# Patient Record
Sex: Female | Born: 1945 | Race: White | Hispanic: No | Smoking: Current every day smoker
Health system: Southern US, Community
[De-identification: ages and names within clinical notes are randomized; demographics above are authoritative.]

## PROBLEM LIST (undated history)

## (undated) DIAGNOSIS — S22069A Unspecified fracture of T7-T8 vertebra, initial encounter for closed fracture: Secondary | ICD-10-CM

## (undated) DIAGNOSIS — M549 Dorsalgia, unspecified: Secondary | ICD-10-CM

## (undated) DIAGNOSIS — N183 Chronic kidney disease, stage 3 unspecified: Secondary | ICD-10-CM

## (undated) DIAGNOSIS — G894 Chronic pain syndrome: Secondary | ICD-10-CM

## (undated) DIAGNOSIS — S22089A Unspecified fracture of T11-T12 vertebra, initial encounter for closed fracture: Secondary | ICD-10-CM

## (undated) DIAGNOSIS — G589 Mononeuropathy, unspecified: Secondary | ICD-10-CM

## (undated) DIAGNOSIS — J449 Chronic obstructive pulmonary disease, unspecified: Secondary | ICD-10-CM

## (undated) DIAGNOSIS — G4700 Insomnia, unspecified: Secondary | ICD-10-CM

## (undated) DIAGNOSIS — H269 Unspecified cataract: Secondary | ICD-10-CM

## (undated) DIAGNOSIS — I252 Old myocardial infarction: Secondary | ICD-10-CM

## (undated) DIAGNOSIS — F32A Depression, unspecified: Secondary | ICD-10-CM

## (undated) DIAGNOSIS — M1990 Unspecified osteoarthritis, unspecified site: Secondary | ICD-10-CM

## (undated) DIAGNOSIS — F419 Anxiety disorder, unspecified: Secondary | ICD-10-CM

## (undated) DIAGNOSIS — G8929 Other chronic pain: Secondary | ICD-10-CM

## (undated) DIAGNOSIS — E785 Hyperlipidemia, unspecified: Secondary | ICD-10-CM

## (undated) DIAGNOSIS — E119 Type 2 diabetes mellitus without complications: Secondary | ICD-10-CM

## (undated) DIAGNOSIS — F329 Major depressive disorder, single episode, unspecified: Secondary | ICD-10-CM

## (undated) DIAGNOSIS — I739 Peripheral vascular disease, unspecified: Secondary | ICD-10-CM

## (undated) DIAGNOSIS — E7800 Pure hypercholesterolemia, unspecified: Secondary | ICD-10-CM

## (undated) DIAGNOSIS — M810 Age-related osteoporosis without current pathological fracture: Secondary | ICD-10-CM

## (undated) DIAGNOSIS — I10 Essential (primary) hypertension: Secondary | ICD-10-CM

## (undated) DIAGNOSIS — M792 Neuralgia and neuritis, unspecified: Secondary | ICD-10-CM

## (undated) HISTORY — PX: TONSILLECTOMY: SUR1361

## (undated) HISTORY — DX: Pure hypercholesterolemia, unspecified: E78.00

## (undated) HISTORY — DX: Unspecified cataract: H26.9

## (undated) HISTORY — DX: Chronic obstructive pulmonary disease, unspecified: J44.9

## (undated) HISTORY — DX: Neuralgia and neuritis, unspecified: M79.2

## (undated) HISTORY — DX: Unspecified fracture of t11-T12 vertebra, initial encounter for closed fracture: S22.089A

## (undated) HISTORY — DX: Chronic kidney disease, stage 3 unspecified: N18.30

## (undated) HISTORY — PX: APPENDECTOMY: SHX54

## (undated) HISTORY — DX: Unspecified fracture of T7-t8 vertebra, initial encounter for closed fracture: S22.069A

## (undated) HISTORY — DX: Hyperlipidemia, unspecified: E78.5

## (undated) HISTORY — DX: Mononeuropathy, unspecified: G58.9

## (undated) HISTORY — DX: Depression, unspecified: F32.A

## (undated) HISTORY — DX: Unspecified osteoarthritis, unspecified site: M19.90

## (undated) HISTORY — DX: Old myocardial infarction: I25.2

## (undated) HISTORY — PX: BLADDER REPAIR: SHX76

## (undated) HISTORY — DX: Anxiety disorder, unspecified: F41.9

## (undated) HISTORY — DX: Major depressive disorder, single episode, unspecified: F32.9

## (undated) HISTORY — DX: Type 2 diabetes mellitus without complications: E11.9

## (undated) HISTORY — PX: WISDOM TOOTH EXTRACTION: SHX21

## (undated) HISTORY — DX: Age-related osteoporosis without current pathological fracture: M81.0

## (undated) HISTORY — DX: Essential (primary) hypertension: I10

## (undated) HISTORY — PX: OTHER SURGICAL HISTORY: SHX169

## (undated) HISTORY — DX: Peripheral vascular disease, unspecified: I73.9

## (undated) HISTORY — DX: Insomnia, unspecified: G47.00

## (undated) HISTORY — PX: ABDOMINAL HYSTERECTOMY: SHX81

## (undated) HISTORY — DX: Chronic pain syndrome: G89.4

## (undated) HISTORY — DX: Other chronic pain: G89.29

---

## 2001-01-03 ENCOUNTER — Emergency Department (HOSPITAL_COMMUNITY): Admission: EM | Admit: 2001-01-03 | Discharge: 2001-01-03 | Payer: Self-pay | Admitting: Emergency Medicine

## 2001-02-11 ENCOUNTER — Encounter: Payer: Self-pay | Admitting: Neurology

## 2001-02-11 ENCOUNTER — Encounter: Admission: RE | Admit: 2001-02-11 | Discharge: 2001-02-11 | Payer: Self-pay | Admitting: Neurology

## 2006-03-27 ENCOUNTER — Encounter: Admission: RE | Admit: 2006-03-27 | Discharge: 2006-03-27 | Payer: Self-pay | Admitting: Family Medicine

## 2006-09-09 ENCOUNTER — Encounter: Admission: RE | Admit: 2006-09-09 | Discharge: 2006-09-09 | Payer: Self-pay | Admitting: Orthopedic Surgery

## 2009-11-16 ENCOUNTER — Encounter: Admission: RE | Admit: 2009-11-16 | Discharge: 2009-11-16 | Payer: Self-pay | Admitting: Family Medicine

## 2009-12-06 ENCOUNTER — Encounter (HOSPITAL_COMMUNITY): Admission: RE | Admit: 2009-12-06 | Discharge: 2010-02-07 | Payer: Self-pay | Admitting: Family Medicine

## 2012-05-09 ENCOUNTER — Emergency Department (HOSPITAL_COMMUNITY)
Admission: EM | Admit: 2012-05-09 | Discharge: 2012-05-10 | Disposition: A | Discharge status: Home / Self Care | Payer: Medicare Other | Attending: Emergency Medicine | Admitting: Emergency Medicine

## 2012-05-09 ENCOUNTER — Encounter (HOSPITAL_COMMUNITY): Payer: Self-pay | Admitting: Emergency Medicine

## 2012-05-09 DIAGNOSIS — S31109A Unspecified open wound of abdominal wall, unspecified quadrant without penetration into peritoneal cavity, initial encounter: Secondary | ICD-10-CM | POA: Insufficient documentation

## 2012-05-09 DIAGNOSIS — W5551XA Bitten by raccoon, initial encounter: Secondary | ICD-10-CM

## 2012-05-09 DIAGNOSIS — S61209A Unspecified open wound of unspecified finger without damage to nail, initial encounter: Secondary | ICD-10-CM | POA: Insufficient documentation

## 2012-05-09 DIAGNOSIS — F17200 Nicotine dependence, unspecified, uncomplicated: Secondary | ICD-10-CM | POA: Insufficient documentation

## 2012-05-09 DIAGNOSIS — IMO0001 Reserved for inherently not codable concepts without codable children: Secondary | ICD-10-CM | POA: Insufficient documentation

## 2012-05-09 DIAGNOSIS — Y92009 Unspecified place in unspecified non-institutional (private) residence as the place of occurrence of the external cause: Secondary | ICD-10-CM | POA: Insufficient documentation

## 2012-05-09 DIAGNOSIS — Z23 Encounter for immunization: Secondary | ICD-10-CM | POA: Insufficient documentation

## 2012-05-09 MED ORDER — AMOXICILLIN-POT CLAVULANATE 875-125 MG PO TABS
1.0000 | ORAL_TABLET | Freq: Once | ORAL | Status: AC
  Administered 2012-05-10: 1 via ORAL
  Filled 2012-05-09: qty 1

## 2012-05-09 MED ORDER — TETANUS-DIPHTHERIA TOXOIDS TD 5-2 LFU IM INJ
0.5000 mL | INJECTION | Freq: Once | INTRAMUSCULAR | Status: AC
  Administered 2012-05-10: 0.5 mL via INTRAMUSCULAR
  Filled 2012-05-09: qty 0.5

## 2012-05-09 MED ORDER — HYDROCODONE-ACETAMINOPHEN 5-325 MG PO TABS
1.0000 | ORAL_TABLET | Freq: Once | ORAL | Status: AC
  Administered 2012-05-10: 1 via ORAL
  Filled 2012-05-09: qty 1

## 2012-05-09 NOTE — ED Notes (Signed)
ZOX:[id number]<AV> Expected date:<BR> Expected time:<BR> Means of arrival:<BR> Comments:<BR> Triage 3

## 2012-05-09 NOTE — ED Notes (Signed)
MD at bedside. 

## 2012-05-09 NOTE — ED Notes (Signed)
Pt alert, nad, c/o "attacked by raccoon", pt has several laceration to right fa, right finger skin tear, bleeding controlled, resp even unlabored, skin pwd

## 2012-05-10 MED ORDER — AMOXICILLIN-POT CLAVULANATE 875-125 MG PO TABS: 1.0000 | ORAL_TABLET | Freq: Two times a day (BID) | ORAL | Status: AC

## 2012-05-10 MED ORDER — HYDROCODONE-ACETAMINOPHEN 5-325 MG PO TABS: 1.0000 | ORAL_TABLET | ORAL | Status: AC | PRN

## 2012-05-10 NOTE — ED Provider Notes (Signed)
Medical screening examination/treatment/procedure(s) were conducted as a shared visit with non-physician practitioner(s) and myself.  I personally evaluated the patient during the encounter  Abx. Local wound care. Racoon in custody. Pt prefers to not have rabies vaccine and immunoglobulin at this time until status known. She was outside with dogs, therefore not necessarily an unprovoked attack. Infection warnings   Lyanne Co, MD 05/10/12 [phone]

## 2012-05-10 NOTE — ED Notes (Signed)
MD at bedside.  EDPA Thelma Barge suturing

## 2012-05-10 NOTE — ED Provider Notes (Signed)
History     CSN: [id number]  Arrival date & time 05/09/12  2258   First MD Initiated Contact with Patient 05/09/12 2339      Chief Complaint  Patient presents with  . Animal Bite    (Consider location/radiation/quality/duration/timing/severity/associated sxs/prior treatment) HPI Comments: Patient here status post been attacked by a raccoon. States that she took her dog out just before bed and there was a raccoon in the yard. She states that the raccoon then quit after her dog who ran to her and knocked her down. She states that the raccoon then attacked her. She has scratches to her right forearm and hand with a bite to the dorsum of the proximal phalanx of the fourth finger. Also with a bite mark to the right flank and bite mark to the lateral heels. All wounds are hemostatic. The finger injury has a chunk of skin which has been removed. The patient is able to flex and extend and has good sensation distally. The raccoon was subsequently killed by her son-in-law and is in the custody of animal control. She works 8/10 pain to the areas. Denies any injury from the fall. Reports tetanus is not up-to-date. States her penicillin allergy consists of abdominal pain but denies rash, anaphylaxis, diarrhea.  Patient is a 66 y.o. female presenting with animal bite. The history is provided by the patient. No language interpreter was used.  Animal Bite  The incident occurred just prior to arrival. The incident occurred at home. She came to the ER via personal transport. Torso Injury Location: Right abdomen. There is an injury to the right forearm and right hand. There is an injury to the right ring finger. There is an injury to the right foot and left foot. The pain is moderate. It is unlikely that a foreign body is present. Associated symptoms include pain when bearing weight. Pertinent negatives include no chest pain, no numbness, no visual disturbance, no abdominal pain, no bowel incontinence, no nausea, no  vomiting, no bladder incontinence, no headaches, no hearing loss, no inability to bear weight, no neck pain, no focal weakness, no decreased responsiveness, no light-headedness, no loss of consciousness, no seizures, no tingling, no weakness, no cough, no difficulty breathing and no memory loss. There have been no prior injuries to these areas. She is right-handed. Her tetanus status is unknown. There were no sick contacts. She has received no recent medical care.    History reviewed. No pertinent past medical history.  Past Surgical History  Procedure Date  . Abdominal hysterectomy     No family history on file.  History  Substance Use Topics  . Smoking status: Current Everyday Smoker -- 1.0 packs/day    Types: Cigarettes  . Smokeless tobacco: Not on file  . Alcohol Use: No    OB History    Grav Para Term Preterm Abortions TAB SAB Ect Mult Living                  Review of Systems  Constitutional: Negative for fever, chills and decreased responsiveness.  HENT: Negative for hearing loss and neck pain.   Eyes: Negative for pain and visual disturbance.  Respiratory: Negative for cough.   Cardiovascular: Negative for chest pain.  Gastrointestinal: Negative for nausea, vomiting, abdominal pain and bowel incontinence.  Genitourinary: Negative for bladder incontinence and dysuria.  Musculoskeletal: Positive for arthralgias.  Skin: Positive for wound.  Neurological: Negative for tingling, focal weakness, seizures, loss of consciousness, weakness, light-headedness, numbness and headaches.  Psychiatric/Behavioral: Negative for memory loss.  All other systems reviewed and are negative.    Allergies  Morphine and related; Penicillins; Sulfa drugs cross reactors; and Xanax  Home Medications   Current Outpatient Rx  Name Route Sig Dispense Refill  . TRAMADOL HCL 50 MG PO TABS Oral Take 50 mg by mouth 3 (three) times daily.      BP 166/97  Pulse 83  Temp 98 F (36.7 C)  Resp  16  Wt 158 lb (71.668 kg)  SpO2 96%  Physical Exam  Nursing note and vitals reviewed. Constitutional: She is oriented to person, place, and time. She appears well-developed and well-nourished. No distress.  HENT:  Head: Normocephalic and atraumatic.  Right Ear: External ear normal.  Left Ear: External ear normal.  Nose: Nose normal.  Mouth/Throat: Oropharynx is clear and moist. No oropharyngeal exudate.  Eyes: Conjunctivae are normal. Pupils are equal, round, and reactive to light. No scleral icterus.  Neck: Normal range of motion. Neck supple.  Cardiovascular: Normal rate, regular rhythm and normal heart sounds.  Exam reveals no gallop and no friction rub.   No murmur heard. Pulmonary/Chest: Effort normal and breath sounds normal. No respiratory distress. She has no wheezes. She has no rales. She exhibits no tenderness.  Abdominal: Soft. Bowel sounds are normal. She exhibits no distension. There is no tenderness.  Musculoskeletal: Normal range of motion. She exhibits tenderness. She exhibits no edema.  Lymphadenopathy:    She has no cervical adenopathy.  Neurological: She is alert and oriented to person, place, and time. No cranial nerve deficit. She exhibits normal muscle tone. Coordination normal.  Skin: Skin is warm and dry. There is erythema.       Superficial hemostatic scratches to right forearm and hand. Dorsum of right fourth finger at the proximal phalanx with 1 cm laceration, portion of the lacerated area has an avulsed area of where the skin is missing, flexion and extension are intact, sensation intact distally, wound is hemostatic, capillary refill less than 3 seconds, 1 cm bite mark to right flank which is hemostatic, 1 cm bite mark to bilateral heels also hemostatic  Psychiatric: She has a normal mood and affect. Her behavior is normal. Judgment and thought content normal.    ED Course  Procedures (including critical care time)  Labs Reviewed - No data to display No  results found. LACERATION REPAIR Performed by: Patrecia Pour. Authorized by: Patrecia Pour Consent: Verbal consent obtained. Risks and benefits: risks, benefits and alternatives were discussed Consent given by: patient Patient identity confirmed: provided demographic data Prepped and Draped in normal sterile fashion Wound explored  Laceration Location: right 4th finger  Laceration Length: 1cm  No Foreign Bodies seen or palpated  Anesthesia: local infiltration  Local anesthetic: lidocaine 2% without epinephrine  Anesthetic total: 3 ml  Irrigation method: syringe Amount of cleaning: standard  Skin closure: 5.0 nylon  Number of sutures: 2  Technique: loose simple interrupted.  Patient tolerance: Patient tolerated the procedure well with no immediate complications.   Animal bite   MDM  Patient here status post multiple bites and scratches from a raccoon. The bite to the right fourth finger because of the area of avulsion was loosely tacked down with 2 sutures. The rest of the wounds were dressed with bacitracin ointment. Patient was given a dose of pain medication, tetanus updated and Augmentin. The patient has an allergy to penicillin, this allergy was noted to be pain so we have opted to give her the  Augmentin and watch her for an hour. If no reaction or adverse effects after an hour she will be discharged. Steffanie Dunn is currently in the custody of animal control who will be testing for rabies. Patient is aware that she should return to [facility] come urgent care for rabies vaccine and immunoglobulin should the raccoon be positive.          Izola [place], [place] 05/10/12 0120

## 2012-05-11 ENCOUNTER — Emergency Department (HOSPITAL_COMMUNITY): Payer: Medicare Other

## 2012-05-11 ENCOUNTER — Encounter (HOSPITAL_COMMUNITY): Payer: Self-pay | Admitting: Emergency Medicine

## 2012-05-11 ENCOUNTER — Emergency Department (HOSPITAL_COMMUNITY)
Admission: EM | Admit: 2012-05-11 | Discharge: 2012-05-11 | Disposition: A | Discharge status: Home / Self Care | Payer: Medicare Other | Attending: Emergency Medicine | Admitting: Emergency Medicine

## 2012-05-11 DIAGNOSIS — S61409A Unspecified open wound of unspecified hand, initial encounter: Secondary | ICD-10-CM | POA: Insufficient documentation

## 2012-05-11 DIAGNOSIS — IMO0001 Reserved for inherently not codable concepts without codable children: Secondary | ICD-10-CM | POA: Insufficient documentation

## 2012-05-11 DIAGNOSIS — Z23 Encounter for immunization: Secondary | ICD-10-CM | POA: Insufficient documentation

## 2012-05-11 DIAGNOSIS — IMO0002 Reserved for concepts with insufficient information to code with codable children: Secondary | ICD-10-CM | POA: Insufficient documentation

## 2012-05-11 DIAGNOSIS — S91309A Unspecified open wound, unspecified foot, initial encounter: Secondary | ICD-10-CM | POA: Insufficient documentation

## 2012-05-11 DIAGNOSIS — R071 Chest pain on breathing: Secondary | ICD-10-CM | POA: Insufficient documentation

## 2012-05-11 DIAGNOSIS — T148XXA Other injury of unspecified body region, initial encounter: Secondary | ICD-10-CM

## 2012-05-11 DIAGNOSIS — S51809A Unspecified open wound of unspecified forearm, initial encounter: Secondary | ICD-10-CM | POA: Insufficient documentation

## 2012-05-11 DIAGNOSIS — Z203 Contact with and (suspected) exposure to rabies: Secondary | ICD-10-CM | POA: Insufficient documentation

## 2012-05-11 MED ORDER — RABIES IMMUNE GLOBULIN 150 UNIT/ML IM INJ
20.0000 [IU]/kg | INJECTION | Freq: Once | INTRAMUSCULAR | Status: AC
  Administered 2012-05-11: 1425 [IU] via INTRAMUSCULAR
  Filled 2012-05-11: qty 9.5

## 2012-05-11 MED ORDER — RABIES VACCINE, PCEC IM SUSR
1.0000 mL | Freq: Once | INTRAMUSCULAR | Status: AC
  Administered 2012-05-11: 1 mL via INTRAMUSCULAR
  Filled 2012-05-11: qty 1

## 2012-05-11 NOTE — ED Notes (Signed)
Given schedule for serial vaccines as per flow manager.

## 2012-05-11 NOTE — ED Notes (Signed)
Copy of rabies vaccine schedule faxed to [facility] by Lonzo Cloud, RN.  Copy of rabies vaccine schedule faxed to urgent care and pharmacy.

## 2012-05-11 NOTE — ED Notes (Signed)
Pt returned to ED to initiate rabies series. Stated that [facility] confirmed a positive report

## 2012-05-11 NOTE — ED Provider Notes (Signed)
History     CSN: [id number]  Arrival date & time 05/11/12  1144   First MD Initiated Contact with Patient 05/11/12 1209      Chief Complaint  Patient presents with  . Animal Bite    Exposed to racoon 48 hrs ago, scratches to r/hip, both feet, rhand    (Consider location/radiation/quality/duration/timing/severity/associated sxs/prior treatment) Patient is a 66 y.o. female presenting with animal bite. The history is provided by the patient.  Animal Bite  The incident occurred more than 2 days ago. Pertinent negatives include no headaches and no weakness.  Pt reports being attacked by a racoon in her yard two days ago. States was bit on the right foot, right hip, right hand. States also fell to the ground, pain to left ribs. State right heel severe pain, also in the left ribs. Pt on Augmentin for infection, Had stitches placed in right hand. States racoon was tested, and test came back positive for rabies. Was told to come for rabies shots. Pt denies fever, chills, malaise.   History reviewed. No pertinent past medical history.  Past Surgical History  Procedure Date  . Abdominal hysterectomy   . Bladder repair     Family History  Problem Relation Age of Onset  . Diabetes Mother   . Cancer Mother     History  Substance Use Topics  . Smoking status: Current Everyday Smoker -- 1.0 packs/day    Types: Cigarettes  . Smokeless tobacco: Not on file  . Alcohol Use: No    OB History    Grav Para Term Preterm Abortions TAB SAB Ect Mult Living                  Review of Systems  Constitutional: Negative for fever and chills.  Respiratory: Negative.   Cardiovascular: Negative.   Gastrointestinal: Negative.   Musculoskeletal: Negative for back pain.  Skin: Positive for color change and wound.  Neurological: Negative for dizziness, weakness and headaches.    Allergies  Morphine and related; Penicillins; Sulfa drugs cross reactors; and Xanax  Home Medications   Current  Outpatient Rx  Name Route Sig Dispense Refill  . AMOXICILLIN-POT CLAVULANATE 875-125 MG PO TABS Oral Take 1 tablet by mouth 2 (two) times daily. 28 tablet 0  . HYDROCODONE-ACETAMINOPHEN 5-325 MG PO TABS Oral Take 1 tablet by mouth every 4 (four) hours as needed for pain. 30 tablet 0  . TRAMADOL HCL 50 MG PO TABS Oral Take 50 mg by mouth 3 (three) times daily.      BP 140/77  Pulse 81  Temp 98.7 F (37.1 C) (Oral)  Resp 16  Wt 158 lb 8.2 oz (71.9 kg)  SpO2 97%  Physical Exam  Nursing note and vitals reviewed. Constitutional: She is oriented to person, place, and time. She appears well-developed and well-nourished. No distress.  Neck: Neck supple.  Cardiovascular: Normal rate, regular rhythm and normal heart sounds.   Pulmonary/Chest: Effort normal and breath sounds normal. No respiratory distress. She has no wheezes. She has no rales. She exhibits tenderness.       Tender over left anterior lower ribs, no crepitus  Abdominal: Soft. Bowel sounds are normal. She exhibits no distension. There is no tenderness.  Musculoskeletal:       Multiple abrasions and puncture wounds over right hand, right foot, right forearm. Full ROM of all joints.   Neurological: She is alert and oriented to person, place, and time.  Skin: Skin is warm and dry.  Multiple abrasions and puncture wounds to the right hand, right hip, right heel and foot, right forearm. There are two stitches intact in the right dorsal proximal ring finger. Some redness, and drainage noted around the wound.   Psychiatric: She has a normal mood and affect.    ED Course  Procedures (including critical care time)  Labs Reviewed - No data to display Dg Ribs Unilateral W/chest Left  05/11/2012  *RADIOLOGY REPORT*  Clinical Data: Left-sided chest pain.  LEFT RIBS AND CHEST - 3+ VIEW  Comparison: Visualized lung bases on CT abdomen and pelvis 11/16/2009.  Findings: No left rib fractures identified.  No intrinsic osseous abnormalities  involving the left ribs.  Cardiac silhouette normal in size.  Thoracic aorta mildly tortuous and atherosclerotic.  Hilar and mediastinal contours otherwise unremarkable.  Focal opacity in the lingula, shown on prior CT examinations represent scarring.  Lungs otherwise clear.  No visible pleural effusions.  No pneumothorax.  IMPRESSION:  1.  No left rib fractures identified. 2.  Scarring in the lingula.  No acute cardiopulmonary disease.  Original Report Authenticated By: Arnell Sieving, M.D.   Dg Foot Complete Right  05/11/2012  *RADIOLOGY REPORT*  Clinical Data: Status post animal bite on the healed the foot.  RIGHT FOOT COMPLETE - 3+ VIEW  Comparison: None.  Findings: Imaged bones, joints and soft tissues appear normal.  IMPRESSION: Negative exam.  Original Report Authenticated By: Bernadene Bell. D'ALESSIO, M.D.   X-rays obtained. Pt received rabies vaccine and immunoglobulin. She is in no distress. Wounds apper inflamed but no signs of major infection in any of them. She is on augmentin currently. Will d/c home with follow up for suture removal and next rabies vaccine.   1. Animal bites   2. Rabies exposure       MDM         Lottie Mussel, PA 05/11/12 1527

## 2012-05-12 ENCOUNTER — Telehealth (HOSPITAL_COMMUNITY): Payer: Self-pay | Admitting: *Deleted

## 2012-05-12 NOTE — ED Provider Notes (Signed)
Medical screening examination/treatment/procedure(s) were performed by non-physician practitioner and as supervising physician I was immediately available for consultation/collaboration.  Raeford Razor, MD 05/12/12 [phone]

## 2012-05-12 NOTE — ED Notes (Signed)
Pt.'s daughter called to schedule times for rabies vaccine. 7/9 @ 1400, 7/16 and 7/23 @ 1130.  She is concerned about her Mom not getting the rabies immune globulin around the bites after talking to the vet.  I told her I would call the pharmacist and call her back.  Discussed with the pharmacist at [facility]. I explained that she was bit by a raccoon and came in 2 days after the bites, when she found out it was rabid. I reviewed her chart and told him where she was bit. He said because her bites were on her finger and heel it was not feasable and as long as she got it within the 7 days of the bites, in her other muscles ( not the gluteus or the arm the rabies vaccine went in),and she finishes the series on time, she should be OK. I called [name]- daughter back and told her this.  This reassured her. [name] 05/12/2012

## 2012-05-14 ENCOUNTER — Encounter (HOSPITAL_COMMUNITY): Payer: Self-pay | Admitting: *Deleted

## 2012-05-14 ENCOUNTER — Emergency Department (HOSPITAL_COMMUNITY)
Admission: EM | Admit: 2012-05-14 | Discharge: 2012-05-14 | Disposition: A | Discharge status: Home / Self Care | Payer: Medicare Other | Source: Home / Self Care | Attending: Emergency Medicine | Admitting: Emergency Medicine

## 2012-05-14 DIAGNOSIS — Z23 Encounter for immunization: Secondary | ICD-10-CM

## 2012-05-14 MED ORDER — RABIES VACCINE, PCEC IM SUSR
INTRAMUSCULAR | Status: AC
  Filled 2012-05-14: qty 1

## 2012-05-14 MED ORDER — RABIES VACCINE, PCEC IM SUSR
1.0000 mL | Freq: Once | INTRAMUSCULAR | Status: AC
  Administered 2012-05-14: 1 mL via INTRAMUSCULAR

## 2012-05-14 NOTE — ED Notes (Signed)
Here for 2nd rabies vaccine for rabid racoon attack.  C/o pain to R ring finger and L shoulder blade.  C/ feeling fatigued.

## 2012-05-14 NOTE — ED Notes (Signed)
Call if any problems.  Return 7/16 @ 1130.

## 2012-05-18 ENCOUNTER — Encounter (HOSPITAL_COMMUNITY): Payer: Self-pay | Admitting: *Deleted

## 2012-05-18 ENCOUNTER — Emergency Department (INDEPENDENT_AMBULATORY_CARE_PROVIDER_SITE_OTHER)
Admission: EM | Admit: 2012-05-18 | Discharge: 2012-05-18 | Disposition: A | Discharge status: Home / Self Care | Payer: Medicare Other | Source: Home / Self Care

## 2012-05-18 DIAGNOSIS — Z23 Encounter for immunization: Secondary | ICD-10-CM

## 2012-05-18 MED ORDER — RABIES VACCINE, PCEC IM SUSR
1.0000 mL | Freq: Once | INTRAMUSCULAR | Status: AC
  Administered 2012-05-18: 1 mL via INTRAMUSCULAR

## 2012-05-18 MED ORDER — RABIES VACCINE, PCEC IM SUSR
INTRAMUSCULAR | Status: AC
  Filled 2012-05-18: qty 1

## 2012-05-18 NOTE — ED Notes (Signed)
Here for third rabies vaccine for bites from a rabid raccoon.  Wounds are healing, no signs of infection. States she went to see Dr. Holley Bouche today and had the sutures removed from her R ring finger.

## 2012-05-25 ENCOUNTER — Emergency Department (HOSPITAL_COMMUNITY)
Admission: EM | Admit: 2012-05-25 | Discharge: 2012-05-25 | Disposition: A | Discharge status: Home / Self Care | Payer: Medicare Other | Source: Home / Self Care

## 2012-05-25 ENCOUNTER — Encounter (HOSPITAL_COMMUNITY): Payer: Self-pay | Admitting: *Deleted

## 2012-05-25 DIAGNOSIS — Z23 Encounter for immunization: Secondary | ICD-10-CM

## 2012-05-25 MED ORDER — RABIES VACCINE, PCEC IM SUSR
INTRAMUSCULAR | Status: AC
  Filled 2012-05-25: qty 1

## 2012-05-25 MED ORDER — RABIES VACCINE, PCEC IM SUSR
1.0000 mL | Freq: Once | INTRAMUSCULAR | Status: AC
  Administered 2012-05-25: 1 mL via INTRAMUSCULAR

## 2012-05-25 NOTE — ED Notes (Signed)
Here for last rabies vaccine.  Wounds appear to be healing. No signs of infection.

## 2012-12-14 ENCOUNTER — Ambulatory Visit
Admission: RE | Admit: 2012-12-14 | Discharge: 2012-12-14 | Disposition: A | Discharge status: Home / Self Care | Payer: Medicare Other | Source: Ambulatory Visit | Attending: Family Medicine | Admitting: Family Medicine

## 2012-12-14 ENCOUNTER — Other Ambulatory Visit: Payer: Self-pay | Admitting: Family Medicine

## 2012-12-14 DIAGNOSIS — M545 Low back pain, unspecified: Secondary | ICD-10-CM

## 2012-12-31 ENCOUNTER — Other Ambulatory Visit: Payer: Self-pay

## 2012-12-31 ENCOUNTER — Other Ambulatory Visit: Payer: Self-pay | Admitting: Family Medicine

## 2012-12-31 DIAGNOSIS — Z1231 Encounter for screening mammogram for malignant neoplasm of breast: Secondary | ICD-10-CM

## 2013-01-21 ENCOUNTER — Encounter: Payer: Self-pay | Admitting: Diagnostic Neuroimaging

## 2013-01-24 ENCOUNTER — Ambulatory Visit (INDEPENDENT_AMBULATORY_CARE_PROVIDER_SITE_OTHER): Payer: Medicare Other | Admitting: Diagnostic Neuroimaging

## 2013-01-24 ENCOUNTER — Encounter: Payer: Self-pay | Admitting: Diagnostic Neuroimaging

## 2013-01-24 VITALS — BP 152/87 | HR 72 | Temp 98.1°F | Ht 59.0 in | Wt 161.0 lb

## 2013-01-24 DIAGNOSIS — R209 Unspecified disturbances of skin sensation: Secondary | ICD-10-CM

## 2013-01-24 DIAGNOSIS — M79609 Pain in unspecified limb: Secondary | ICD-10-CM

## 2013-01-24 DIAGNOSIS — IMO0002 Reserved for concepts with insufficient information to code with codable children: Secondary | ICD-10-CM

## 2013-01-24 DIAGNOSIS — M5414 Radiculopathy, thoracic region: Secondary | ICD-10-CM

## 2013-01-24 DIAGNOSIS — R200 Anesthesia of skin: Secondary | ICD-10-CM

## 2013-01-24 NOTE — Patient Instructions (Signed)
Consider trial of cymbalta.
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
## 2013-01-28 ENCOUNTER — Ambulatory Visit
Admission: RE | Admit: 2013-01-28 | Discharge: 2013-01-28 | Disposition: A | Discharge status: Home / Self Care | Payer: Medicare Other | Source: Ambulatory Visit

## 2013-01-28 DIAGNOSIS — Z1231 Encounter for screening mammogram for malignant neoplasm of breast: Secondary | ICD-10-CM

## 2013-02-22 ENCOUNTER — Other Ambulatory Visit: Payer: Self-pay | Admitting: Neurosurgery

## 2013-03-03 HISTORY — PX: BACK SURGERY: SHX140

## 2013-03-10 ENCOUNTER — Encounter (HOSPITAL_COMMUNITY): Payer: Self-pay | Admitting: Pharmacist

## 2013-03-14 ENCOUNTER — Encounter (HOSPITAL_COMMUNITY): Payer: Self-pay

## 2013-03-14 ENCOUNTER — Encounter (HOSPITAL_COMMUNITY)
Admission: RE | Admit: 2013-03-14 | Discharge: 2013-03-14 | Disposition: A | Discharge status: Home / Self Care | Payer: Medicare Other | Source: Ambulatory Visit | Attending: Neurosurgery | Admitting: Neurosurgery

## 2013-03-14 DIAGNOSIS — Z01818 Encounter for other preprocedural examination: Secondary | ICD-10-CM | POA: Insufficient documentation

## 2013-03-14 DIAGNOSIS — R9431 Abnormal electrocardiogram [ECG] [EKG]: Secondary | ICD-10-CM | POA: Insufficient documentation

## 2013-03-14 DIAGNOSIS — Z01810 Encounter for preprocedural cardiovascular examination: Secondary | ICD-10-CM | POA: Insufficient documentation

## 2013-03-14 DIAGNOSIS — Z01812 Encounter for preprocedural laboratory examination: Secondary | ICD-10-CM | POA: Insufficient documentation

## 2013-03-14 LAB — CBC
Hemoglobin: 14.2 g/dL (ref 12.0–15.0)
MCH: 28 pg (ref 26.0–34.0)
MCV: 83.4 fL (ref 78.0–100.0)
Platelets: 319 10*3/uL (ref 150–400)
RBC: 5.07 MIL/uL (ref 3.87–5.11)
WBC: 9.3 10*3/uL (ref 4.0–10.5)

## 2013-03-14 LAB — ABO/RH: ABO/RH(D): O POS

## 2013-03-14 LAB — BASIC METABOLIC PANEL
CO2: 26 mEq/L (ref 19–32)
Glucose, Bld: 156 mg/dL — ABNORMAL HIGH (ref 70–99)
Potassium: 3.7 mEq/L (ref 3.5–5.1)
Sodium: 138 mEq/L (ref 135–145)

## 2013-03-14 LAB — TYPE AND SCREEN: ABO/RH(D): O POS

## 2013-03-14 LAB — SURGICAL PCR SCREEN: Staphylococcus aureus: NEGATIVE

## 2013-03-14 NOTE — Pre-Procedure Instructions (Addendum)
Jasman JARRETT CHICOINE  03/14/2013   Your procedure is scheduled on:  03/22/13  Report to [facility] at 530 AM.  Call this number if you have problems the morning of surgery: [phone]   Remember:   Do not eat food or drink liquids after midnight.   Take these medicines the morning of surgery with A SIP OF WATER: pain med    Do not wear jewelry, make-up or nail polish.  Do not wear lotions, powders, or perfumes. You may wear deodorant.  Do not shave 48 hours prior to surgery. Men may shave face and neck.  Do not bring valuables to the hospital.  Contacts, dentures or bridgework may not be worn into surgery.  Leave suitcase in the car. After surgery it may be brought to your room.  For patients admitted to the hospital, checkout time is 11:00 AM the day of  discharge.   Patients discharged the day of surgery will not be allowed to drive  home.  Name and phone number of your driver:   Special Instructions: Shower using CHG 2 nights before surgery and the night before surgery.  If you shower the day of surgery use CHG.  Use special wash - you have one bottle of CHG for all showers.  You should use approximately 1/3 of the bottle for each shower.   Please read over the following fact sheets that you were given: Pain Booklet, Coughing and Deep Breathing, Blood Transfusion Information, MRSA Information and Surgical Site Infection Prevention

## 2013-03-14 NOTE — Progress Notes (Addendum)
Office called to release orders  req'd cxr from randall harris at [facility] req'd prior ekg, office notes from dr [employer]

## 2013-03-16 NOTE — Progress Notes (Signed)
This is a 67 year old female who is scheduled for lumbar spine surgery to be performed by Dr. Hilda Lias on Tuesday 22 Mar 2013. An EKG was performed on 14 Mar 2013 and has been confirmed by cardiology. 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 The EKG is negative for Q waves in V1/V2. I personally spoke with this patient's PCP office (Dr. Johny Blamer with [facility]) and there is no prior EKG on file for comparison. This patient's risk factors include hypertension, hyperlipidemia and tobacco abuse, however, there is no known history of coronary artery disease. She did not voice any cardiac related symptoms, issues or complaints during her pre-anesthesia evaluation. May proceed with surgery as scheduled.

## 2013-03-21 NOTE — H&P (Signed)
Alexis Mclean is an 67 y.o. female.   Chief Complaint: lumbar pain HPI: patient seen in my office several days ago ago with a history of lumbar pain with radiation to both legs and shoulder pain to the left. Also dizziness with headache. The lumbar pain is worse with sitting and walking. She knows about having spondylolisthesis at the lumbar level  Past Medical History  Diagnosis Date  . Hyperlipemia   . Neuropathic pain of chest   . Hypertension     Past Surgical History  Procedure Laterality Date  . Abdominal hysterectomy    . Bladder repair    . Wisdom tooth extraction    . Tonsillectomy    . Appendectomy    . Salpingoorrphorectomy Right     Family History  Problem Relation Age of Onset  . Stroke Mother   . Diabetes Mother   . Diabetes Father   . Emphysema Father   . Cancer Father     Lung  . Asthma Father   . Diabetes Sister   . Diabetes Brother    Social History:  reports that she has been smoking Cigarettes.  She has a 39.9 pack-year smoking history. She has never used smokeless tobacco. She reports that she does not drink alcohol or use illicit drugs.  Allergies:  Allergies  Allergen Reactions  . Latex Rash  . Lyrica (Pregabalin)     unknown  . Morphine And Related     BP dropped  . Neurontin (Gabapentin)     Room spun  . Penicillins Diarrhea and Other (See Comments)    Cramping in abd.took recently no problems  . Sulfa Drugs Cross Reactors Nausea Only    Really sick feeling  . Xanax (Alprazolam)     insomnia    No prescriptions prior to admission    No results found for this or any previous visit (from the past 48 hour(s)). No results found.  Review of Systems  Eyes: Negative.   Respiratory: Positive for shortness of breath.   Cardiovascular: Negative.        Arterial hypertension  Gastrointestinal: Positive for heartburn.  Genitourinary: Positive for urgency.  Musculoskeletal: Positive for back pain.  Skin: Negative.   Neurological:  Positive for dizziness, focal weakness, weakness and headaches.  Endo/Heme/Allergies: Negative.     There were no vitals taken for this visit. Physical Exam hent,nl. Neck, nl. Cv, nl. Lungs, rales bilaterally. Abdomen,soft. Extremities, nl. NEURO  WEAKNESS OF df BOTH FEET. SENSORY, NL. . dtr 1 plus. Mri and lumbar r-rays shows grade 2 spondylolisthesis at l5-s1.  Assessment/Plan Patient to go ahead with decompression and fusion at l5-s1 with pedicles screws and poss cages.she is aware of the risks such as infection, csf leak, hematoma and because of the chronicity , the possibility of no improvement   Hikeem Andersson M 03/21/2013, 6:00 PM

## 2013-03-22 ENCOUNTER — Inpatient Hospital Stay (HOSPITAL_COMMUNITY): Payer: Medicare Other

## 2013-03-22 ENCOUNTER — Inpatient Hospital Stay (HOSPITAL_COMMUNITY)
Admission: RE | Admit: 2013-03-22 | Discharge: 2013-03-27 | DRG: 460 | Disposition: A | Discharge status: Home / Self Care | Payer: Medicare Other | Source: Ambulatory Visit | Attending: Neurosurgery | Admitting: Neurosurgery

## 2013-03-22 ENCOUNTER — Encounter (HOSPITAL_COMMUNITY): Payer: Self-pay | Admitting: *Deleted

## 2013-03-22 ENCOUNTER — Inpatient Hospital Stay (HOSPITAL_COMMUNITY): Payer: Medicare Other | Admitting: Anesthesiology

## 2013-03-22 ENCOUNTER — Encounter (HOSPITAL_COMMUNITY)
Admission: RE | Disposition: A | Discharge status: Home / Self Care | Payer: Self-pay | Source: Ambulatory Visit | Attending: Neurosurgery

## 2013-03-22 ENCOUNTER — Encounter (HOSPITAL_COMMUNITY): Payer: Self-pay | Admitting: Anesthesiology

## 2013-03-22 DIAGNOSIS — F17200 Nicotine dependence, unspecified, uncomplicated: Secondary | ICD-10-CM | POA: Diagnosis present

## 2013-03-22 DIAGNOSIS — IMO0002 Reserved for concepts with insufficient information to code with codable children: Secondary | ICD-10-CM | POA: Diagnosis present

## 2013-03-22 DIAGNOSIS — M545 Low back pain, unspecified: Secondary | ICD-10-CM

## 2013-03-22 DIAGNOSIS — I10 Essential (primary) hypertension: Secondary | ICD-10-CM | POA: Diagnosis present

## 2013-03-22 DIAGNOSIS — E785 Hyperlipidemia, unspecified: Secondary | ICD-10-CM | POA: Diagnosis present

## 2013-03-22 DIAGNOSIS — Q762 Congenital spondylolisthesis: Principal | ICD-10-CM

## 2013-03-22 DIAGNOSIS — K5900 Constipation, unspecified: Secondary | ICD-10-CM | POA: Diagnosis not present

## 2013-03-22 DIAGNOSIS — M810 Age-related osteoporosis without current pathological fracture: Secondary | ICD-10-CM | POA: Diagnosis present

## 2013-03-22 SURGERY — POSTERIOR LUMBAR FUSION 1 LEVEL
Anesthesia: General | Site: Back | Wound class: Clean

## 2013-03-22 MED ORDER — DIAZEPAM 5 MG PO TABS
5.0000 mg | ORAL_TABLET | Freq: Four times a day (QID) | ORAL | Status: DC | PRN
  Administered 2013-03-22 – 2013-03-27 (×16): 5 mg via ORAL
  Filled 2013-03-22 (×16): qty 1

## 2013-03-22 MED ORDER — NEOSTIGMINE METHYLSULFATE 1 MG/ML IJ SOLN
INTRAMUSCULAR | Status: DC | PRN
  Administered 2013-03-22: 4 mg via INTRAVENOUS

## 2013-03-22 MED ORDER — ONDANSETRON HCL 4 MG/2ML IJ SOLN: 4.0000 mg | Freq: Four times a day (QID) | INTRAMUSCULAR | Status: DC | PRN

## 2013-03-22 MED ORDER — FENTANYL CITRATE 0.05 MG/ML IJ SOLN
INTRAMUSCULAR | Status: DC | PRN
  Administered 2013-03-22: 50 ug via INTRAVENOUS
  Administered 2013-03-22: 25 ug via INTRAVENOUS
  Administered 2013-03-22 (×2): 50 ug via INTRAVENOUS
  Administered 2013-03-22: 25 ug via INTRAVENOUS
  Administered 2013-03-22 (×6): 50 ug via INTRAVENOUS

## 2013-03-22 MED ORDER — PROMETHAZINE HCL 25 MG/ML IJ SOLN: 6.2500 mg | INTRAMUSCULAR | Status: DC | PRN

## 2013-03-22 MED ORDER — SODIUM CHLORIDE 0.9 % IV SOLN: 250.0000 mL | INTRAVENOUS | Status: DC

## 2013-03-22 MED ORDER — ACETAMINOPHEN 650 MG RE SUPP: 650.0000 mg | RECTAL | Status: DC | PRN

## 2013-03-22 MED ORDER — SODIUM CHLORIDE 0.9 % IJ SOLN: 9.0000 mL | INTRAMUSCULAR | Status: DC | PRN

## 2013-03-22 MED ORDER — ZOLPIDEM TARTRATE 5 MG PO TABS: 5.0000 mg | ORAL_TABLET | Freq: Every evening | ORAL | Status: DC | PRN

## 2013-03-22 MED ORDER — THROMBIN 20000 UNITS EX SOLR
CUTANEOUS | Status: DC | PRN
  Administered 2013-03-22: 08:00:00 via TOPICAL

## 2013-03-22 MED ORDER — VANCOMYCIN HCL IN DEXTROSE 1-5 GM/200ML-% IV SOLN
1000.0000 mg | Freq: Two times a day (BID) | INTRAVENOUS | Status: DC
  Administered 2013-03-22 – 2013-03-24 (×5): 1000 mg via INTRAVENOUS
  Filled 2013-03-22 (×7): qty 200

## 2013-03-22 MED ORDER — HYDROMORPHONE 0.3 MG/ML IV SOLN
INTRAVENOUS | Status: DC
  Administered 2013-03-22: 1.5 mg via INTRAVENOUS

## 2013-03-22 MED ORDER — PHENOL 1.4 % MT LIQD: 1.0000 | OROMUCOSAL | Status: DC | PRN

## 2013-03-22 MED ORDER — SODIUM CHLORIDE 0.9 % IV SOLN
INTRAVENOUS | Status: DC
  Administered 2013-03-22 – 2013-03-23 (×2): via INTRAVENOUS

## 2013-03-22 MED ORDER — MENTHOL 3 MG MT LOZG: 1.0000 | LOZENGE | OROMUCOSAL | Status: DC | PRN

## 2013-03-22 MED ORDER — ONDANSETRON HCL 4 MG/2ML IJ SOLN: 4.0000 mg | INTRAMUSCULAR | Status: DC | PRN

## 2013-03-22 MED ORDER — SODIUM CHLORIDE 0.9 % IJ SOLN: 3.0000 mL | INTRAMUSCULAR | Status: DC | PRN

## 2013-03-22 MED ORDER — PHENYLEPHRINE HCL 10 MG/ML IJ SOLN
INTRAMUSCULAR | Status: DC | PRN
  Administered 2013-03-22: 80 ug via INTRAVENOUS
  Administered 2013-03-22: 40 ug via INTRAVENOUS
  Administered 2013-03-22: 80 ug via INTRAVENOUS

## 2013-03-22 MED ORDER — VECURONIUM BROMIDE 10 MG IV SOLR
INTRAVENOUS | Status: DC | PRN
  Administered 2013-03-22: 3 mg via INTRAVENOUS

## 2013-03-22 MED ORDER — HEMOSTATIC AGENTS (NO CHARGE) OPTIME
TOPICAL | Status: DC | PRN
  Administered 2013-03-22: 1 via TOPICAL

## 2013-03-22 MED ORDER — BUPIVACAINE LIPOSOME 1.3 % IJ SUSP
20.0000 mL | INTRAMUSCULAR | Status: AC
  Filled 2013-03-22: qty 20

## 2013-03-22 MED ORDER — EPHEDRINE SULFATE 50 MG/ML IJ SOLN
INTRAMUSCULAR | Status: DC | PRN
  Administered 2013-03-22 (×2): 5 mg via INTRAVENOUS

## 2013-03-22 MED ORDER — NALOXONE HCL 0.4 MG/ML IJ SOLN: 0.4000 mg | INTRAMUSCULAR | Status: DC | PRN

## 2013-03-22 MED ORDER — MEPERIDINE HCL 25 MG/ML IJ SOLN: 6.2500 mg | INTRAMUSCULAR | Status: DC | PRN

## 2013-03-22 MED ORDER — MIDAZOLAM HCL 5 MG/5ML IJ SOLN
INTRAMUSCULAR | Status: DC | PRN
  Administered 2013-03-22: 2 mg via INTRAVENOUS

## 2013-03-22 MED ORDER — LACTATED RINGERS IV SOLN
INTRAVENOUS | Status: DC | PRN
  Administered 2013-03-22 (×3): via INTRAVENOUS

## 2013-03-22 MED ORDER — HYDROMORPHONE HCL PF 1 MG/ML IJ SOLN
0.2500 mg | INTRAMUSCULAR | Status: DC | PRN
  Administered 2013-03-22: 0.5 mg via INTRAVENOUS
  Administered 2013-03-22: 0.25 mg via INTRAVENOUS

## 2013-03-22 MED ORDER — ROCURONIUM BROMIDE 100 MG/10ML IV SOLN
INTRAVENOUS | Status: DC | PRN
  Administered 2013-03-22: 50 mg via INTRAVENOUS

## 2013-03-22 MED ORDER — GLYCOPYRROLATE 0.2 MG/ML IJ SOLN
INTRAMUSCULAR | Status: DC | PRN
  Administered 2013-03-22: 0.6 mg via INTRAVENOUS

## 2013-03-22 MED ORDER — SODIUM CHLORIDE 0.9 % IJ SOLN: 3.0000 mL | Freq: Two times a day (BID) | INTRAMUSCULAR | Status: DC

## 2013-03-22 MED ORDER — ONDANSETRON HCL 4 MG/2ML IJ SOLN
INTRAMUSCULAR | Status: DC | PRN
  Administered 2013-03-22: 4 mg via INTRAVENOUS

## 2013-03-22 MED ORDER — LOSARTAN POTASSIUM 25 MG PO TABS
25.0000 mg | ORAL_TABLET | Freq: Every morning | ORAL | Status: DC
  Administered 2013-03-25 – 2013-03-27 (×3): 25 mg via ORAL
  Filled 2013-03-22 (×6): qty 1

## 2013-03-22 MED ORDER — ACETAMINOPHEN 325 MG PO TABS: 650.0000 mg | ORAL_TABLET | ORAL | Status: DC | PRN

## 2013-03-22 MED ORDER — HYDROMORPHONE HCL PF 1 MG/ML IJ SOLN
INTRAMUSCULAR | Status: AC
  Administered 2013-03-22: 0.25 mg via INTRAVENOUS
  Filled 2013-03-22: qty 1

## 2013-03-22 MED ORDER — DIPHENHYDRAMINE HCL 50 MG/ML IJ SOLN: 12.5000 mg | Freq: Four times a day (QID) | INTRAMUSCULAR | Status: DC | PRN

## 2013-03-22 MED ORDER — DIPHENHYDRAMINE HCL 12.5 MG/5ML PO ELIX: 12.5000 mg | ORAL_SOLUTION | Freq: Four times a day (QID) | ORAL | Status: DC | PRN

## 2013-03-22 MED ORDER — HYDROMORPHONE 0.3 MG/ML IV SOLN
INTRAVENOUS | Status: AC
  Administered 2013-03-22: 11:00:00
  Filled 2013-03-22: qty 25

## 2013-03-22 MED ORDER — LIDOCAINE HCL (CARDIAC) 20 MG/ML IV SOLN
INTRAVENOUS | Status: DC | PRN
  Administered 2013-03-22: 100 mg via INTRAVENOUS

## 2013-03-22 MED ORDER — 0.9 % SODIUM CHLORIDE (POUR BTL) OPTIME
TOPICAL | Status: DC | PRN
  Administered 2013-03-22: 1000 mL

## 2013-03-22 MED ORDER — ARTIFICIAL TEARS OP OINT
TOPICAL_OINTMENT | OPHTHALMIC | Status: DC | PRN
  Administered 2013-03-22: 1 via OPHTHALMIC

## 2013-03-22 MED ORDER — PROPOFOL 10 MG/ML IV BOLUS
INTRAVENOUS | Status: DC | PRN
  Administered 2013-03-22: 30 mg via INTRAVENOUS
  Administered 2013-03-22: 150 mg via INTRAVENOUS

## 2013-03-22 MED ORDER — VANCOMYCIN HCL IN DEXTROSE 1-5 GM/200ML-% IV SOLN
1000.0000 mg | INTRAVENOUS | Status: AC
  Administered 2013-03-22: 1000 mg via INTRAVENOUS

## 2013-03-22 MED ORDER — BUPIVACAINE LIPOSOME 1.3 % IJ SUSP
INTRAMUSCULAR | Status: DC | PRN
  Administered 2013-03-22: 20 mL

## 2013-03-22 MED ORDER — OXYCODONE-ACETAMINOPHEN 5-325 MG PO TABS
1.0000 | ORAL_TABLET | ORAL | Status: DC | PRN
  Administered 2013-03-22 – 2013-03-23 (×4): 2 via ORAL
  Filled 2013-03-22 (×4): qty 2

## 2013-03-22 SURGICAL SUPPLY — 69 items
ADH SKN CLS APL DERMABOND .7 (GAUZE/BANDAGES/DRESSINGS) ×1
APL SKNCLS STERI-STRIP NONHPOA (GAUZE/BANDAGES/DRESSINGS) ×1
BENZOIN TINCTURE PRP APPL 2/3 (GAUZE/BANDAGES/DRESSINGS) ×2 IMPLANT
BLADE SURG ROTATE 9660 (MISCELLANEOUS) IMPLANT
BUR ACORN 6.0 (BURR) ×2 IMPLANT
BUR MATCHSTICK NEURO 3.0 LAGG (BURR) ×2 IMPLANT
CANISTER SUCTION 2500CC (MISCELLANEOUS) ×2 IMPLANT
CAP REVERE LOCKING (Cap) ×4 IMPLANT
CLOTH BEACON ORANGE TIMEOUT ST (SAFETY) ×2 IMPLANT
CONN CROSSLINK REV 38-50MM (Connector) ×2 IMPLANT
CONNECTOR CRSLINK REV 38-50MM (Connector) IMPLANT
CONT SPEC 4OZ CLIKSEAL STRL BL (MISCELLANEOUS) ×3 IMPLANT
COVER BACK TABLE 24X17X13 BIG (DRAPES) IMPLANT
COVER TABLE BACK 60X90 (DRAPES) ×2 IMPLANT
DERMABOND ADVANCED (GAUZE/BANDAGES/DRESSINGS) ×1
DERMABOND ADVANCED .7 DNX12 (GAUZE/BANDAGES/DRESSINGS) IMPLANT
DRAPE C-ARM 42X72 X-RAY (DRAPES) ×4 IMPLANT
DRAPE LAPAROTOMY 100X72X124 (DRAPES) ×2 IMPLANT
DRAPE POUCH INSTRU U-SHP 10X18 (DRAPES) ×2 IMPLANT
DRSG PAD ABDOMINAL 8X10 ST (GAUZE/BANDAGES/DRESSINGS) ×1 IMPLANT
DURAPREP 26ML APPLICATOR (WOUND CARE) ×2 IMPLANT
ELECT REM PT RETURN 9FT ADLT (ELECTROSURGICAL) ×2
ELECTRODE REM PT RTRN 9FT ADLT (ELECTROSURGICAL) ×1 IMPLANT
EVACUATOR 1/8 PVC DRAIN (DRAIN) IMPLANT
EVACUATOR 3/16  PVC DRAIN (DRAIN) ×1
EVACUATOR 3/16 PVC DRAIN (DRAIN) IMPLANT
GAUZE SPONGE 4X4 16PLY XRAY LF (GAUZE/BANDAGES/DRESSINGS) ×2 IMPLANT
GLOVE BIOGEL M 8.0 STRL (GLOVE) ×1 IMPLANT
GLOVE EXAM NITRILE LRG STRL (GLOVE) ×2 IMPLANT
GLOVE EXAM NITRILE MD LF STRL (GLOVE) IMPLANT
GLOVE EXAM NITRILE XL STR (GLOVE) IMPLANT
GLOVE EXAM NITRILE XS STR PU (GLOVE) IMPLANT
GOWN BRE IMP SLV AUR LG STRL (GOWN DISPOSABLE) ×2 IMPLANT
GOWN BRE IMP SLV AUR XL STRL (GOWN DISPOSABLE) ×3 IMPLANT
GOWN STRL REIN 2XL LVL4 (GOWN DISPOSABLE) IMPLANT
KIT BASIN OR (CUSTOM PROCEDURE TRAY) ×2 IMPLANT
KIT INFUSE MEDIUM (Orthopedic Implant) ×1 IMPLANT
KIT ROOM TURNOVER OR (KITS) ×2 IMPLANT
NDL HYPO 18GX1.5 BLUNT FILL (NEEDLE) IMPLANT
NDL HYPO 21X1.5 SAFETY (NEEDLE) IMPLANT
NDL HYPO 25X1 1.5 SAFETY (NEEDLE) IMPLANT
NEEDLE HYPO 18GX1.5 BLUNT FILL (NEEDLE) IMPLANT
NEEDLE HYPO 21X1.5 SAFETY (NEEDLE) ×2 IMPLANT
NEEDLE HYPO 25X1 1.5 SAFETY (NEEDLE) IMPLANT
NS IRRIG 1000ML POUR BTL (IV SOLUTION) ×2 IMPLANT
PACK LAMINECTOMY NEURO (CUSTOM PROCEDURE TRAY) ×2 IMPLANT
PAD ARMBOARD 7.5X6 YLW CONV (MISCELLANEOUS) ×6 IMPLANT
PATTIES SURGICAL .5 X1 (DISPOSABLE) ×2 IMPLANT
PATTIES SURGICAL .5 X3 (DISPOSABLE) IMPLANT
PATTIES SURGICAL 1X1 (DISPOSABLE) ×1 IMPLANT
ROD REVERE 6.35 40MM (Rod) ×2 IMPLANT
SCREW REVERE 5.5X45 (Screw) ×2 IMPLANT
SCREW REVERE 6.35 5.5X40MM (Screw) ×2 IMPLANT
SPONGE GAUZE 4X4 12PLY (GAUZE/BANDAGES/DRESSINGS) ×2 IMPLANT
SPONGE LAP 4X18 X RAY DECT (DISPOSABLE) IMPLANT
SPONGE NEURO XRAY DETECT 1X3 (DISPOSABLE) IMPLANT
SPONGE SURGIFOAM ABS GEL 100 (HEMOSTASIS) ×2 IMPLANT
STRIP CLOSURE SKIN 1/2X4 (GAUZE/BANDAGES/DRESSINGS) ×2 IMPLANT
SUT VIC AB 1 CT1 18XBRD ANBCTR (SUTURE) ×2 IMPLANT
SUT VIC AB 1 CT1 8-18 (SUTURE) ×6
SUT VIC AB 2-0 CP2 18 (SUTURE) ×3 IMPLANT
SUT VIC AB 3-0 SH 8-18 (SUTURE) ×2 IMPLANT
SYR 20CC LL (SYRINGE) ×1 IMPLANT
SYR 20ML ECCENTRIC (SYRINGE) ×2 IMPLANT
SYR 5ML LL (SYRINGE) IMPLANT
TOWEL OR 17X24 6PK STRL BLUE (TOWEL DISPOSABLE) ×2 IMPLANT
TOWEL OR 17X26 10 PK STRL BLUE (TOWEL DISPOSABLE) ×2 IMPLANT
TRAY FOLEY BAG SILVER LF 14FR (CATHETERS) ×1 IMPLANT
WATER STERILE IRR 1000ML POUR (IV SOLUTION) ×2 IMPLANT

## 2013-03-22 NOTE — Progress Notes (Signed)
Pt. Stating she has a skin tear on left hand. Stated she bumped it on a table last Wednesday. The area has a scab on it and it also has erythemia around the edges of the scab. Applied sterile 2x2 gauze dressing and secured with tape.

## 2013-03-22 NOTE — Anesthesia Postprocedure Evaluation (Signed)
  Anesthesia Post-op Note  Patient: Alexis Mclean  Procedure(s) Performed: Procedure(s) with comments: POSTERIOR LUMBAR FUSION 1 LEVEL (N/A) - Lumbar five sacral one Diskectomy/Cages/Pedicle screws/posterolateral arthrodesis/cellsaver  Patient Location: PACU  Anesthesia Type:General  Level of Consciousness: awake  Airway and Oxygen Therapy: Patient Spontanous Breathing  Post-op Pain: mild  Post-op Assessment: Post-op Vital signs reviewed  Post-op Vital Signs: stable  Complications: No apparent anesthesia complications

## 2013-03-22 NOTE — Anesthesia Preprocedure Evaluation (Addendum)
Anesthesia Evaluation  Patient identified by MRN, date of birth, ID band Patient awake    History of Anesthesia Complications Negative for: history of anesthetic complications  Airway Mallampati: I  Neck ROM: Full    Dental  (+) Edentulous Upper, Edentulous Lower and Dental Advisory Given   Pulmonary neg pulmonary ROS,  breath sounds clear to auscultation        Cardiovascular hypertension, Rhythm:Regular Rate:Normal     Neuro/Psych    GI/Hepatic negative GI ROS, Neg liver ROS,   Endo/Other  negative endocrine ROS  Renal/GU negative Renal ROS     Musculoskeletal  (+) Arthritis -,   Abdominal (+) + obese,   Peds  Hematology negative hematology ROS (+)   Anesthesia Other Findings   Reproductive/Obstetrics                         Anesthesia Physical Anesthesia Plan  ASA: II  Anesthesia Plan: General   Post-op Pain Management:    Induction: Intravenous  Airway Management Planned: Oral ETT  Additional Equipment:   Intra-op Plan:   Post-operative Plan: Extubation in OR  Informed Consent: I have reviewed the patients History and Physical, chart, labs and discussed the procedure including the risks, benefits and alternatives for the proposed anesthesia with the patient or authorized representative who has indicated his/her understanding and acceptance.   Dental advisory given  Plan Discussed with: CRNA and Surgeon  Anesthesia Plan Comments:         Anesthesia Quick Evaluation

## 2013-03-22 NOTE — Anesthesia Procedure Notes (Signed)
Procedure Name: Intubation Date/Time: 03/22/2013 7:57 AM Performed by: Luster Landsberg Pre-anesthesia Checklist: Patient identified, Emergency Drugs available, Suction available and Patient being monitored Patient Re-evaluated:Patient Re-evaluated prior to inductionOxygen Delivery Method: Circle system utilized Preoxygenation: Pre-oxygenation with 100% oxygen Intubation Type: IV induction Ventilation: Mask ventilation without difficulty and Oral airway inserted - appropriate to patient size Laryngoscope Size: Mac and 4 Grade View: Grade I Tube type: Oral Tube size: 7.0 mm Number of attempts: 1 Airway Equipment and Method: Stylet Placement Confirmation: ETT inserted through vocal cords under direct vision,  breath sounds checked- equal and bilateral and positive ETCO2 Secured at: 20 cm Tube secured with: Tape Dental Injury: Teeth and Oropharynx as per pre-operative assessment

## 2013-03-22 NOTE — Progress Notes (Signed)
UR COMPLETED  

## 2013-03-22 NOTE — Transfer of Care (Signed)
Immediate Anesthesia Transfer of Care Note  Patient: Alexis Mclean  Procedure(s) Performed: Procedure(s) with comments: POSTERIOR LUMBAR FUSION 1 LEVEL (N/A) - Lumbar five sacral one Diskectomy/Cages/Pedicle screws/posterolateral arthrodesis/cellsaver  Patient Location: PACU  Anesthesia Type:General  Level of Consciousness: responds to stimulation  Airway & Oxygen Therapy: Patient Spontanous Breathing and Patient connected to nasal cannula oxygen  Post-op Assessment: Report given to PACU RN, Post -op Vital signs reviewed and stable and Patient moving all extremities  Post vital signs: Reviewed and stable  Complications: No apparent anesthesia complications

## 2013-03-22 NOTE — Progress Notes (Signed)
Op note [phone])

## 2013-03-22 NOTE — Progress Notes (Signed)
ANTIBIOTIC CONSULT NOTE - INITIAL  Pharmacy Consult for Vancomycin Indication: Post op surgical prophylaxsis  Allergies  Allergen Reactions  . Latex Rash  . Lyrica (Pregabalin)     unknown  . Morphine And Related     BP dropped  . Neurontin (Gabapentin)     Room spun  . Penicillins Diarrhea and Other (See Comments)    Cramping in abd.took recently no problems  . Sulfa Drugs Cross Reactors Nausea Only    Really sick feeling  . Xanax (Alprazolam)     insomnia    Patient Measurements: Height: 4\' 11"  (149.9 cm) Weight: 162 lb (73.483 kg) IBW/kg (Calculated) : 43.2 Adjusted Body Weight: 52kg  Vital Signs: Temp: 97.5 F (36.4 C) (05/20 1607) Temp src: Oral (05/20 1607) BP: 100/52 mmHg (05/20 1607) Pulse Rate: 83 (05/20 1607) Intake/Output from previous day:   Intake/Output from this shift: Total I/O In: 2000 [I.V.:2000] Out: 725 [Urine:525; Blood:200]  Labs: No results found for this basename: WBC, HGB, PLT, LABCREA, CREATININE,  in the last 72 hours The CrCl is unknown because both a height and weight (above a minimum accepted value) are required for this calculation. No results found for this basename: VANCOTROUGH, [name], VANCORANDOM, GENTTROUGH, GENTPEAK, GENTRANDOM, TOBRATROUGH, TOBRAPEAK, TOBRARND, AMIKACINPEAK, AMIKACINTROU, AMIKACIN,  in the last 72 hours   Microbiology: Recent Results (from the past 720 hour(s))  SURGICAL PCR SCREEN     Status: None   Collection Time    03/14/13 11:41 AM      Result Value Range Status   MRSA, PCR NEGATIVE  NEGATIVE Final   Staphylococcus aureus NEGATIVE  NEGATIVE Final   Comment:            The Xpert SA Assay (FDA     approved for NASAL specimens     in patients over 65 years of age),     is one component of     a comprehensive surveillance     program.  Test performance has     been validated by [employer] for patients greater     than or equal to 46 year old.     It is not intended     to diagnose  infection nor to     guide or monitor treatment.    Medical History: Past Medical History  Diagnosis Date  . Hyperlipemia   . Neuropathic pain of chest   . Hypertension     Medications:  Prescriptions prior to admission  Medication Sig Dispense Refill  . Calcium Carbonate Antacid (ANTACID PO) Take 1 tablet by mouth daily as needed (indigestion).      Marland Kitchen HYDROcodone-acetaminophen (NORCO/VICODIN) 5-325 MG per tablet Take 0.5 tablets by mouth 2 (two) times daily as needed for pain.       Marland Kitchen loratadine (CLARITIN) 10 MG tablet Take 10 mg by mouth daily as needed for allergies.      Marland Kitchen losartan (COZAAR) 25 MG tablet Take 25 mg by mouth every morning.       . traMADol (ULTRAM) 50 MG tablet Take 50 mg by mouth 3 (three) times daily.       Assessment: 67 yo F s/p posterior lumbar fusion.  Pharmacy consulted to dose post op vancomycin. Patient with drain in place.  Goal of Therapy:  Vancomycin trough level 15-20 mcg/ml  Plan:  1. Vancomycin 1g IV q12h 2. Follow up SCr, UOP, cultures, clinical course and adjust as clinically indicated.  Alexis Mclean 03/22/2013,4:31  PM

## 2013-03-23 MED ORDER — OXYCODONE HCL 5 MG PO TABS
10.0000 mg | ORAL_TABLET | ORAL | Status: DC | PRN
  Administered 2013-03-23 – 2013-03-27 (×22): 10 mg via ORAL
  Filled 2013-03-23 (×22): qty 2

## 2013-03-23 MED ORDER — BISACODYL 5 MG PO TBEC
5.0000 mg | DELAYED_RELEASE_TABLET | Freq: Every day | ORAL | Status: DC | PRN
  Administered 2013-03-25 – 2013-03-26 (×2): 5 mg via ORAL
  Filled 2013-03-23 (×2): qty 1

## 2013-03-23 MED FILL — Heparin Sodium (Porcine) Inj 1000 Unit/ML: INTRAMUSCULAR | Qty: 30 | Status: AC

## 2013-03-23 MED FILL — Sodium Chloride IV Soln 0.9%: INTRAVENOUS | Qty: 1000 | Status: AC

## 2013-03-23 NOTE — Evaluation (Signed)
Physical Therapy Evaluation Patient Details Name: Alexis Mclean MRN: [id number] DOB: 1945/12/08 Today's Date: 03/23/2013 Time: 1478-2956 PT Time Calculation (min): 31 min  PT Assessment / Plan / Recommendation Clinical Impression  pt s/p L5/S1 PLIF.  She can benefit from PT to maximize I    PT Assessment  Patient needs continued PT services    Follow Up Recommendations  Other (comment);Home health PT (vs no follow up if progresses really well)    Does the patient have the potential to tolerate intense rehabilitation      Barriers to Discharge Other (comment) (PRN asssit)      Equipment Recommendations  Rolling walker with 5" wheels    Recommendations for Other Services     Frequency Min 5X/week    Precautions / Restrictions Precautions Precautions: Back Restrictions Weight Bearing Restrictions: No   Pertinent Vitals/Pain 5-6/10 incisional pain      Mobility  Bed Mobility Bed Mobility: Not assessed Transfers Transfers: Sit to Stand;Stand to Sit Sit to Stand: 4: Min assist;With upper extremity assist;From bed Stand to Sit: 4: Min assist;With upper extremity assist;To chair/3-in-1 Details for Transfer Assistance: vc's for hand placement and safety Ambulation/Gait Ambulation/Gait Assistance: 4: Min assist Ambulation Distance (Feet): 40 Feet Assistive device: Rolling walker Ambulation/Gait Assistance Details: guarded gait, but generally steady Gait Pattern: Step-through pattern    Exercises     PT Diagnosis: Difficulty walking;Acute pain  PT Problem List: Decreased strength;Decreased activity tolerance;Decreased mobility;Decreased knowledge of use of DME;Decreased knowledge of precautions;Pain PT Treatment Interventions: DME instruction;Gait training;Stair training;Functional mobility training;Therapeutic activities;Patient/family education   PT Goals Acute Rehab PT Goals PT Goal Formulation: With patient Time For Goal Achievement: 03/30/13 Potential to  Achieve Goals: Good Pt will go Supine/Side to Sit: with modified independence PT Goal: Supine/Side to Sit - Progress: Goal set today Pt will go Sit to Supine/Side: with modified independence PT Goal: Sit to Supine/Side - Progress: Goal set today Pt will Ambulate: >150 feet;with modified independence;with least restrictive assistive device PT Goal: Ambulate - Progress: Goal set today Pt will Go Up / Down Stairs: 3-5 stairs;with supervision;with rail(s) PT Goal: Up/Down Stairs - Progress: Goal set today  Visit Information  Last PT Received On: 03/23/13 Assistance Needed: +1    Subjective Data  Subjective: I'm glad I don't have a brace if a don't have to Patient Stated Goal: Home   Prior Functioning  Home Living Lives With: Alone Available Help at Discharge: Family Type of Home: House Home Layout: One level Bathroom Shower/Tub: Walk-in shower;Door [employer] Toilet: Standard Home Adaptive Equipment: None Prior Function Level of Independence: Independent Able to Take Stairs?: Yes Driving: Yes Vocation: Retired Musician: No difficulties Dominant Hand: Right    Cognition  Cognition Arousal/Alertness: Awake/alert Behavior During Therapy: WFL for tasks assessed/performed Overall Cognitive Status: Within Functional Limits for tasks assessed    Extremity/Trunk Assessment Right Lower Extremity Assessment RLE ROM/Strength/Tone: Within functional levels Left Lower Extremity Assessment LLE ROM/Strength/Tone: Within functional levels Trunk Assessment Trunk Assessment: Normal   Balance Balance Balance Assessed: No  End of Session PT - End of Session Activity Tolerance: Patient tolerated treatment well;Patient limited by pain Patient left: in chair;with call bell/phone within reach;with family/visitor present Nurse Communication: Mobility status  GP     Mcihael Hinderman, Eliseo Gum 03/23/2013, 6:27 PM  03/23/2013  Inyokern Bing, PT [phone]   (pager)

## 2013-03-23 NOTE — Evaluation (Signed)
Occupational Therapy Evaluation Patient Details Name: Alexis Mclean MRN: [id number] DOB: [birth year] Today's Date: 03/23/2013 Time: 4098-1191 OT Time Calculation (min): 32 min  OT Assessment / Plan / Recommendation Clinical Impression   67 yo female s/p PLIF L5-s1 that could benefit from skilled OT acutely. Recommend HHOT and 3n1 for d/c planning.    OT Assessment  Patient needs continued OT Services    Follow Up Recommendations  Home health OT    Barriers to Discharge      Equipment Recommendations  3 in 1 bedside comode    Recommendations for Other Services    Frequency  Min 2X/week    Precautions / Restrictions Precautions Precautions: Back   Pertinent Vitals/Pain 9 out 10 supine In chair 4 out 10    ADL  Grooming: Wash/dry face;Modified independent Where Assessed - Grooming: Supported sitting Lower Body Dressing: Min guard Where Assessed - Lower Body Dressing: Supported sit to stand (able to cross bil LE) Toilet Transfer: Minimal assistance Toilet Transfer Method: Sit to stand Toilet Transfer Equipment: Raised toilet seat with arms (or 3-in-1 over toilet) Equipment Used: Gait belt;Rolling walker Transfers/Ambulation Related to ADLs: Pt ambulated with RW from EOB to the chair. Pt educated safety and sequence with RW. Pt placing Lt Le first then Rt LE. Pt needed extended time ADL Comments: Pt educated on back precautions and provided handout. Pt educated on home set up as precursor to d/c home with precautions. pt progressed from EOB to chair this session demonstrating crossing BIL LE. pt could benefit next session from toilet transfer, sink level grooming and bed mobility practices    OT Diagnosis: Generalized weakness;Acute pain  OT Problem List: Decreased strength;Decreased activity tolerance;Impaired balance (sitting and/or standing);Decreased safety awareness;Decreased knowledge of use of DME or AE;Decreased knowledge of precautions;Pain OT Treatment  Interventions: Self-care/ADL training;Therapeutic exercise;DME and/or AE instruction;Therapeutic activities;Cognitive remediation/compensation;Patient/family education;Balance training   OT Goals Acute Rehab OT Goals OT Goal Formulation: With patient Time For Goal Achievement: 04/06/13 Potential to Achieve Goals: Good ADL Goals Pt Will Perform Grooming: with modified independence;Standing at sink ADL Goal: Grooming - Progress: Goal set today Pt Will Transfer to Toilet: with supervision;Ambulation;3-in-1 ADL Goal: Toilet Transfer - Progress: Goal set today Pt Will Perform Toileting - Hygiene: with supervision;Sit to stand from 3-in-1/toilet ADL Goal: Toileting - Hygiene - Progress: Goal set today Miscellaneous OT Goals Miscellaneous OT Goal #1: Pt will complete bed mobility MOD I as precursor to adls OT Goal: Miscellaneous Goal #1 - Progress: Goal set today  Visit Information  Last OT Received On: 03/23/13 Assistance Needed: +1    Subjective Data  Subjective: "it just hurts and I just got my pain meds" Patient Stated Goal: to return home   Prior Functioning     Home Living Lives With: Alone Type of Home: House Home Layout: One level Bathroom Shower/Tub: Walk-in shower;Door Dentist: None Prior Function Level of Independence: Independent Able to Take Stairs?: Yes Driving: Yes Vocation: Retired Musician: No difficulties Dominant Hand: Right         Vision/Perception Vision - History Baseline Vision: Wears glasses only for reading Patient Visual Report: No change from baseline   Cognition  Cognition Arousal/Alertness: Awake/alert Behavior During Therapy: WFL for tasks assessed/performed Overall Cognitive Status: Within Functional Limits for tasks assessed    Extremity/Trunk Assessment Right Upper Extremity Assessment RUE ROM/Strength/Tone: WFL for tasks assessed RUE Sensation: WFL - Light Touch RUE  Coordination: WFL - gross/fine motor Left Upper Extremity Assessment LUE  ROM/Strength/Tone: [facility] for tasks assessed (at baseline pain with ROM) LUE Sensation: WFL - Light Touch LUE Coordination: WFL - gross/fine motor Trunk Assessment Trunk Assessment: Normal     Mobility Bed Mobility Bed Mobility: Supine to Sit;Sitting - Scoot to Edge of Bed Supine to Sit: 3: Mod assist;HOB elevated;With rails Sitting - Scoot to Edge of Bed: 3: Mod assist;With rail Details for Bed Mobility Assistance: educated on sequence and pt with pain side<>Sit positioning. pt progressing well for first attempt. pt fearful of OOB due to current pain 9 out 10.  Transfers Transfers: Sit to Stand;Stand to Sit Sit to Stand: 4: Min assist;With upper extremity assist;From bed Stand to Sit: 4: Min assist;With upper extremity assist;To chair/3-in-1 Details for Transfer Assistance: Pt with decr pain with static standing. pt not tolerating sitting on eob well     Exercise     Balance     End of Session OT - End of Session Activity Tolerance: Patient tolerated treatment well Patient left: in chair;with call bell/phone within reach;with family/visitor present Nurse Communication: Mobility status;Precautions  GO     Lucile Shutters 03/23/2013, 2:33 PM Pager: [phone]

## 2013-03-23 NOTE — Progress Notes (Signed)
Patient ID: Alexis Mclean, female   DOB: 1946-05-30, 67 y.o.   MRN: [id number] C/o incisional pain, no legs pain, no weakness. On po analgesics. Pt to help.hemovac to be removed in am

## 2013-03-23 NOTE — Progress Notes (Signed)
Foley cath d/c'd at 0715 per post op order. Patient tolerated well.

## 2013-03-23 NOTE — Op Note (Signed)
NAMERAYCHELL, HOLCOMB             ACCOUNT NO.:  [id number]  MEDICAL RECORD NO.:  [id number]  LOCATION:  [id number]                        FACILITY:  [facility]  PHYSICIAN:  Hilda Lias, M.D.   DATE OF BIRTH:  02/11/1946  DATE OF PROCEDURE:  03/22/2013 DATE OF DISCHARGE:                              OPERATIVE REPORT   PREOPERATIVE DIAGNOSES:  L5-S1 grade 2 spondylolisthesis with chronic L5 radiculopathy.  Osteoporosis.  History of chronic smoker.  POSTOPERATIVE DIAGNOSES:  L5-S1 grade 2 spondylolisthesis with chronic L5 radiculopathy.  Osteoporosis.  History of chronic smoker.  PROCEDURE:  L5 laminectomy and facetectomy.  Lysis of adhesion. Foraminotomy to decompress the L5 and S1 nerve root.  Pedicle screws at L5-S1, posterolateral arthrodesis with autograft and BMP.  Cell Saver, C- arm.  SURGEON:  Hilda Lias, M.D.  ASSISTANT:  Reinaldo Meeker, M.D.  CLINICAL HISTORY:  Mrs. Schmale is a lady who had been complaining of back pain radiation to both legs.  The patient has a history of heavy smoker and also osteoporosis.  X-rays show grade 2 spondylolisthesis of all nerve space between L5-S1.  Surgery was advised and this procedure was fully explained to her and her family.  PROCEDURE:  The patient was taken to the OR, and after intubation, she was positioned on prone manner.  The skin was prepped first with Betadine and later on with DuraPrep.  Drapes were applied.  Midline incision from L4-5 down to L5-S1 was made and muscle was retracted laterally.  Immediately, we found that the posterior arch of L5 was completely loose and the spinous process, lamina and the facet was removed in toto.  Immediately, we found that the patient has quite bit of adhesion compromising the S1 nerve root.  Lysis was accomplished. The foramen for the L5 was quite narrow.  We had to drill and using the 1 and 2-mm Kerrison punch, and we were able to decompress the L5 nerve root.  We tried to enter  into the disk space, but there was no space whatsoever.  Because of that, we decided not to use any cages.  Then using the C-arm in AP view and then a lateral view, we probed the pedicle of L5-S1.  We introduced four screws at the level of S1.  It was 5.5 x 45 and at the level of L5 was 5.5 x 40.  Prior to introducing the screws, we feel the hole just to be sure that we were surrounded by bone.  A rod from L5-S1 was inserted bilaterally and was kept in place with caps.  Then, with the Leksell as well as the drill, we removed the periosteum of L5-S1 as well as part of the ala of the sacrum.  A mix of BMP and autograft was used for arthrodesis.  Prior to closure, we went back again and feel the foramen for L5-S1 with plenty of space.  Then, the area was irrigated.  The wound was closed with Vicryl and Steri- Strips.          ______________________________ Hilda Lias, M.D.     EB/MEDQ  D:  03/22/2013  T:  03/23/2013  Job:  [id number]

## 2013-03-24 MED ORDER — MINERAL OIL RE ENEM
1.0000 | ENEMA | Freq: Once | RECTAL | Status: DC
  Filled 2013-03-24: qty 1

## 2013-03-24 NOTE — Progress Notes (Signed)
Patient ID: Alexis Mclean, female   DOB: [birth year], 67 y.o.   MRN: [id number] Stable,c/o incisional pain,no weakness. Constipation present

## 2013-03-24 NOTE — Progress Notes (Signed)
Physical Therapy Treatment Patient Details Name: Alexis Mclean MRN: [id number] DOB: 06-07-1946 Today's Date: 03/24/2013 Time: 1010-1033 PT Time Calculation (min): 23 min  PT Assessment / Plan / Recommendation Comments on Treatment Session  Pt making steady progress with mobility at this date.  States pain decreases with activity.  Pt should be appropriate for safe d/c home when MD feels appropriate if she cont's to progress as she is.      Follow Up Recommendations  Home health PT;Other (comment) (vs no f/u if progresses really well)     Does the patient have the potential to tolerate intense rehabilitation     Barriers to Discharge        Equipment Recommendations  Rolling walker with 5" wheels    Recommendations for Other Services    Frequency Min 5X/week   Plan Discharge plan remains appropriate;Frequency remains appropriate    Precautions / Restrictions Precautions Precautions: Back Restrictions Weight Bearing Restrictions: No   Pertinent Vitals/Pain 91/0 upon arrival to room.  4/10 when returning back to room after ambulating in hall.      Mobility  Bed Mobility Bed Mobility: Right Sidelying to Sit;Sitting - Scoot to Edge of Bed Right Sidelying to Sit: 4: Min guard;HOB flat;With rails Details for Bed Mobility Assistance: Cues to reinforce technique & use of UE's to increase ease of transitional movements.   Increased time.  Effortful.   Transfers Transfers: Sit to Stand Sit to Stand: 4: Min assist;With upper extremity assist;From bed Stand to Sit: 4: Min assist;With upper extremity assist;To chair/3-in-1;To toilet Details for Transfer Assistance: Cues for safe hand placement & technique.  (A) to achieve standing & balance.  Ambulation/Gait Ambulation/Gait Assistance: 4: Min guard Ambulation Distance (Feet): 130 Feet Assistive device: Rolling walker Ambulation/Gait Assistance Details: Encouragement to increase step/stride length & to relax UE's.  Pt steady but  guarded & stiff posture.  Slow gait speed.   Gait Pattern: Step-through pattern;Decreased stride length;Decreased step length - right;Decreased step length - left Gait velocity: decreased General Gait Details: Cues to reinforce "no twisting" when someone talking to her from behind.   Stairs: No Wheelchair Mobility Wheelchair Mobility: No      PT Goals Acute Rehab PT Goals Time For Goal Achievement: 03/30/13 Potential to Achieve Goals: Good Pt will go Supine/Side to Sit: with modified independence PT Goal: Supine/Side to Sit - Progress: Progressing toward goal Pt will go Sit to Supine/Side: with modified independence Pt will Ambulate: >150 feet;with modified independence;with least restrictive assistive device PT Goal: Ambulate - Progress: Progressing toward goal Pt will Go Up / Down Stairs: 3-5 stairs;with supervision;with rail(s)  Visit Information  Last PT Received On: 03/24/13 Assistance Needed: +1    Subjective Data      Cognition  Cognition Arousal/Alertness: Awake/alert Behavior During Therapy: WFL for tasks assessed/performed Overall Cognitive Status: Within Functional Limits for tasks assessed    Balance     End of Session PT - End of Session Equipment Utilized During Treatment: Gait belt Activity Tolerance: Patient tolerated treatment well Patient left: Other (comment) (bathroom with OT) Nurse Communication: Mobility status    Verdell Face, [place] [phone] 03/24/2013

## 2013-03-24 NOTE — Progress Notes (Signed)
I agree with the following treatment note after reviewing documentation.   Johnston, Dunia Pringle Brynn   OTR/L Pager: [phone] Office: [phone] .   

## 2013-03-24 NOTE — Progress Notes (Signed)
Occupational Therapy Treatment Patient Details Name: Alexis Mclean MRN: [id number] DOB: January 26, 1946 Today's Date: 03/24/2013 Time: 4098-1191 OT Time Calculation (min): 73 min  OT Assessment / Plan / Recommendation Comments on Treatment Session Pt needed extentended time for all ADL, and ambulation.  Pt reported that pain is more under control when walking, and that static standing made her pain increase from a 4/10 to a 6/10. Pt also reported itching behind her knees. Pt took a sponge bath and shower cap sitting on 3in1, and did oral care and washed hands at sink level.  Pt could recall 3/3 precautions, but still needs re-enforcement.    Follow Up Recommendations  Home health OT       Equipment Recommendations  3 in 1 bedside comode       Frequency Min 2X/week      Precautions / Restrictions Precautions Precautions: Back Restrictions Weight Bearing Restrictions: No   Pertinent Vitals/Pain Pt reported pain at 4/10 at start of session, and 6/10 at end (static standing at sink increased pain) Pt also reported itching behind knees.    ADL  Grooming: Performed;Wash/dry hands;Wash/dry face;Denture care;Teeth care;Brushing hair Where Assessed - Grooming: Unsupported standing;Unsupported sitting (stand<>sit bc of fatigue) Upper Body Bathing: Performed;Chest;Right arm;Left arm;Abdomen;Set up Where Assessed - Upper Body Bathing: Unsupported sitting Lower Body Bathing: Performed;Minimal assistance Where Assessed - Lower Body Bathing: Unsupported sitting Upper Body Dressing: Performed;Minimal assistance Where Assessed - Upper Body Dressing: Unsupported sitting Lower Body Dressing: Minimal assistance Where Assessed - Lower Body Dressing: Lean right and/or left;Unsupported sitting Toilet Transfer: Minimal assistance Toilet Transfer Method: Sit to stand Toilet Transfer Equipment: Comfort height toilet;Grab bars Toileting - Clothing Manipulation and Hygiene: Moderate  assistance;Performed Where Assessed - Toileting Clothing Manipulation and Hygiene: Sit to stand from 3-in-1 or toilet Equipment Used: Gait belt;Rolling walker Transfers/Ambulation Related to ADLs: Pt needed extended time for ambulation and transitions. Pt continued Lt LE first then Rt LE.  Pt needed mod cues when turning with the RW. Pt expresses pain when transitioning sit <>stand and stand <>sit but follows safety precautions ADL Comments: Pt performed hand washing, denture care and oral care at sink level. Pt fatigued and so sat on 3in1 for hair washing (Pt did 25%) and sponge bath (Pt did all of UB and was mod assist for LB).      OT Goals Acute Rehab OT Goals OT Goal Formulation: With patient Time For Goal Achievement: 04/06/13 Potential to Achieve Goals: Good ADL Goals Pt Will Perform Grooming: with modified independence;Standing at sink ADL Goal: Grooming - Progress: Progressing toward goals Pt Will Transfer to Toilet: with supervision;Ambulation;3-in-1 ADL Goal: Toilet Transfer - Progress: Progressing toward goals Pt Will Perform Toileting - Hygiene: with supervision;Sit to stand from 3-in-1/toilet ADL Goal: Toileting - Hygiene - Progress: Progressing toward goals Miscellaneous OT Goals Miscellaneous OT Goal #1: Pt will complete bed mobility MOD I as precursor to adls  Visit Information  Last OT Received On: 03/24/13 Assistance Needed: +1          Cognition  Cognition Arousal/Alertness: Awake/alert Behavior During Therapy: WFL for tasks assessed/performed Overall Cognitive Status: Within Functional Limits for tasks assessed    Mobility  Bed Mobility Bed Mobility: Not assessed Transfers Transfers: Sit to Stand;Stand to Sit Sit to Stand: 4: Min assist;With upper extremity assist;From chair/3-in-1;From toilet Stand to Sit: 4: Min assist;With upper extremity assist;To chair/3-in-1;To toilet Details for Transfer Assistance: vc's for hand placement and safety. Pt reports  increased pain (8/10) during transfers.  End of Session OT - End of Session Equipment Utilized During Treatment: Gait belt;Other (comment) (RW, 3in1) Activity Tolerance: Patient limited by fatigue;Patient limited by pain Patient left: in chair;with call bell/phone within reach Nurse Communication: Mobility status;Precautions  GO     Sherryl Manges 03/24/2013, 11:59 AM

## 2013-03-25 MED ORDER — TRAMADOL HCL 50 MG PO TABS
50.0000 mg | ORAL_TABLET | Freq: Four times a day (QID) | ORAL | Status: DC
  Administered 2013-03-25 – 2013-03-27 (×7): 50 mg via ORAL
  Filled 2013-03-25 (×14): qty 1

## 2013-03-25 NOTE — Progress Notes (Signed)
Physical Therapy Treatment Patient Details Name: YVANNA VIDAS MRN: [id number] DOB: 08-10-1946 Today's Date: 03/25/2013 Time: 4098-1191 PT Time Calculation (min): 26 min  PT Assessment / Plan / Recommendation Comments on Treatment Session  Pain in back & bil LE's limiting progression today.  Pt states she feels like she over-did it with therapies yesterday.       Follow Up Recommendations  Home health PT;Other (comment)     Does the patient have the potential to tolerate intense rehabilitation     Barriers to Discharge        Equipment Recommendations  Rolling walker with 5" wheels    Recommendations for Other Services    Frequency Min 5X/week   Plan Discharge plan remains appropriate    Precautions / Restrictions Precautions Precautions: Back Restrictions Weight Bearing Restrictions: No  **Pt requires cueing throughout session to reinforce precautions**  Pertinent Vitals/Pain 9/10 back & bil LE's.  Premedicated.      Mobility  Bed Mobility Bed Mobility: Left Sidelying to Sit;Sitting - Scoot to Edge of Bed Left Sidelying to Sit: 4: Min assist Sitting - Scoot to Edge of Bed: 5: Supervision Details for Bed Mobility Assistance: Incr. time & effortful due to pain.  Minor (A) to bring shoulders/trunk to sitting upright.   Transfers Transfers: Sit to Stand;Stand to Sit Sit to Stand: 4: Min assist;From bed;With upper extremity assist Stand to Sit: 4: Min assist;With upper extremity assist;With armrests;To chair/3-in-1 Details for Transfer Assistance: cues for hand placement & technique.  Pt starting with hands on RW to achieve standing due to increased pain when attempting to push up from bed.   Ambulation/Gait Ambulation/Gait Assistance: 4: Min guard Ambulation Distance (Feet): 80 Feet Assistive device: Rolling walker Ambulation/Gait Assistance Details: Distance limited by pain.  Pt very rigid & guarded.  Relies heavily on RW with UE's.   Gait Pattern: Step-through  pattern;Decreased stride length;Shuffle Gait velocity: decreased General Gait Details: Pain in back & LE's limiting progression.   Stairs: No Wheelchair Mobility Wheelchair Mobility: No      PT Goals Acute Rehab PT Goals Time For Goal Achievement: 03/30/13 Potential to Achieve Goals: Good Pt will go Supine/Side to Sit: with modified independence PT Goal: Supine/Side to Sit - Progress: Not met Pt will go Sit to Supine/Side: with modified independence Pt will Ambulate: >150 feet;with modified independence;with least restrictive assistive device PT Goal: Ambulate - Progress: Not met Pt will Go Up / Down Stairs: 3-5 stairs;with supervision;with rail(s)  Visit Information  Last PT Received On: 03/25/13 Assistance Needed: +1    Subjective Data      Cognition  Cognition Arousal/Alertness: Awake/alert Behavior During Therapy: WFL for tasks assessed/performed Overall Cognitive Status: Within Functional Limits for tasks assessed    Balance     End of Session PT - End of Session Equipment Utilized During Treatment: Gait belt Activity Tolerance: Patient limited by pain Patient left: in chair;with call bell/phone within reach;with family/visitor present Nurse Communication: Mobility status     Verdell Face, [place] [phone] 03/25/2013

## 2013-03-25 NOTE — Progress Notes (Signed)
Orthopedic Tech Progress Note Patient Details:  Alexis Mclean [birth year] [id number]  Patient ID: [name], female   DOB: 1946-07-10, 67 y.o.   MRN: [id number]   Shawnie Pons 03/25/2013, 2:23 PM CALLED [facility] FOR LUMBAR CORSET.

## 2013-03-25 NOTE — Progress Notes (Signed)
Orthopedic Tech Progress Note Patient Details:  Alexis Mclean 06/10/1946 [id number]  Patient ID: [name], female   DOB: September 03, 1946, 67 y.o.   MRN: [id number]   Shawnie Pons 03/25/2013, 3:42 PM Lumbar corset completed by [employer].

## 2013-03-25 NOTE — Progress Notes (Signed)
Patient ID: Alexis Mclean, female   DOB: 1946-08-26, 67 y.o.   MRN: [id number] Drain out, ambulating with help. Voiding. Lumbar corset ordered. Dc over the weekend

## 2013-03-26 MED ORDER — BISACODYL 10 MG RE SUPP
10.0000 mg | Freq: Every day | RECTAL | Status: DC | PRN
  Administered 2013-03-26: 10 mg via RECTAL
  Filled 2013-03-26: qty 1

## 2013-03-26 MED ORDER — POLYETHYLENE GLYCOL 3350 17 G PO PACK
17.0000 g | PACK | Freq: Every day | ORAL | Status: DC
  Administered 2013-03-26 – 2013-03-27 (×2): 17 g via ORAL
  Filled 2013-03-26 (×2): qty 1

## 2013-03-26 NOTE — Progress Notes (Signed)
Occupational Therapy Treatment Patient Details Name: Alexis Mclean MRN: [id number] DOB: 11-02-1946 Today's Date: 03/26/2013 Time: 9562-1308 OT Time Calculation (min): 13 min  OT Assessment / Plan / Recommendation Comments on Treatment Session Pt limited by pain, requires extra time for mobility and ADL. Progressing toward toileting and grooming goals.    Follow Up Recommendations  Home health OT    Barriers to Discharge       Equipment Recommendations  3 in 1 bedside comode    Recommendations for Other Services    Frequency Min 2X/week   Plan Discharge plan remains appropriate    Precautions / Restrictions Precautions Precautions: Back;Fall Precaution Comments: reviewed back precautions.  Pt requires cueing throughout session to reinforce "no twisting" Required Braces or Orthoses: Spinal Brace Spinal Brace: Applied in sitting position Restrictions Weight Bearing Restrictions: No   Pertinent Vitals/Pain 6/10 pain, premedicated, repositioned    ADL  Eating/Feeding: Set up Where Assessed - Eating/Feeding: Chair Grooming: Wash/dry hands;Min guard Where Assessed - Grooming: Unsupported standing Toilet Transfer: Min Pension scheme manager Method: Sit to Barista: Comfort height toilet;Grab bars Toileting - Clothing Manipulation and Hygiene: Minimal assistance Where Assessed - Engineer, mining and Hygiene: Sit to stand from 3-in-1 or toilet Equipment Used: Back brace;Rolling walker;Gait belt Transfers/Ambulation Related to ADLs: min assist with RW and extra time ADL Comments: Pt limited by back pain.    OT Diagnosis:    OT Problem List:   OT Treatment Interventions:     OT Goals ADL Goals Pt Will Perform Grooming: with modified independence;Standing at sink ADL Goal: Grooming - Progress: Progressing toward goals Pt Will Transfer to Toilet: with supervision;Ambulation;3-in-1 ADL Goal: Toilet Transfer - Progress: Progressing  toward goals Pt Will Perform Toileting - Hygiene: with supervision;Sit to stand from 3-in-1/toilet ADL Goal: Toileting - Hygiene - Progress: Progressing toward goals Miscellaneous OT Goals Miscellaneous OT Goal #1: Pt will complete bed mobility MOD I as precursor to adls OT Goal: Miscellaneous Goal #1 - Progress: Progressing toward goals  Visit Information  Last OT Received On: 03/26/13 Assistance Needed: +1    Subjective Data      Prior Functioning       Cognition  Cognition Arousal/Alertness: Awake/alert Behavior During Therapy: WFL for tasks assessed/performed Overall Cognitive Status: Within Functional Limits for tasks assessed    Mobility  Bed Mobility Bed Mobility: Rolling Right;Right Sidelying to Sit;Sitting - Scoot to Edge of Bed Rolling Right: 5: Supervision;With rail Right Sidelying to Sit: 5: Supervision;HOB flat;With rails Sitting - Scoot to Edge of Bed: 6: Modified independent (Device/Increase time) Details for Bed Mobility Assistance: Increased time.  Cues to reinforce no twisting Transfers Sit to Stand: 4: Min guard;With upper extremity assist;From bed Stand to Sit: 4: Min guard;With upper extremity assist;With armrests;To chair/3-in-1 Details for Transfer Assistance: cues for proper hand placement, extra time.    Exercises      Balance Balance Balance Assessed: Yes Static Standing Balance Static Standing - Balance Support: No upper extremity supported Static Standing - Level of Assistance: 5: Stand by assistance   End of Session OT - End of Session Activity Tolerance: Patient limited by fatigue Patient left: in chair;with call bell/phone within reach  GO     Evern Bio 03/26/2013, 1:24 PM [phone]

## 2013-03-26 NOTE — Progress Notes (Signed)
Physical Therapy Treatment Patient Details Name: Alexis Mclean MRN: [id number] DOB: 04/21/1946 Today's Date: 03/26/2013 Time: 0936-1000 PT Time Calculation (min): 24 min  PT Assessment / Plan / Recommendation Comments on Treatment Session  Pt cont's to c/o significant LBP but tolerated activity better today than yesterday's session.  Pt now with back brace & utilized with mobility today.   Pt needs to practice steps & bed mobility before d/c home.      Follow Up Recommendations  Home health PT;Other (comment)     Does the patient have the potential to tolerate intense rehabilitation     Barriers to Discharge        Equipment Recommendations  Rolling walker with 5" wheels    Recommendations for Other Services    Frequency Min 5X/week   Plan Discharge plan remains appropriate    Precautions / Restrictions Precautions Precautions: Back Precaution Comments: reviewed back precautions.  Pt requires cueing throughout session to reinforce "no twisting" Required Braces or Orthoses: Spinal Brace Spinal Brace: Applied in sitting position Restrictions Weight Bearing Restrictions: No   Pertinent Vitals/Pain 7/10 back.  Premedicated.      Mobility  Bed Mobility Bed Mobility: Rolling Right;Right Sidelying to Sit;Sitting - Scoot to Edge of Bed Rolling Right: 5: Supervision;With rail Right Sidelying to Sit: 5: Supervision;HOB flat;With rails Sitting - Scoot to Edge of Bed: 6: Modified independent (Device/Increase time) Details for Bed Mobility Assistance: Increased time.  Cues to reinforce no twisting.  **Pt deferred practicing bed mobility any further today** Transfers Transfers: Sit to Stand;Stand to Sit Sit to Stand: 4: Min guard;With upper extremity assist;From bed Stand to Sit: 4: Min guard;With upper extremity assist;With armrests;To chair/3-in-1 Details for Transfer Assistance: cues for hand placement & technique.  Cont's to be effortful & slow.    Ambulation/Gait Ambulation/Gait Assistance: 4: Min guard;5: Supervision Ambulation Distance (Feet): 130 Feet Assistive device: Rolling walker Ambulation/Gait Assistance Details: Beginning to progress to (S).  Cues to relax UE's & take deep breathes.  Pt with stiff posture, & cont's to rely heavily on RW.   Gait Pattern: Step-through pattern;Decreased stride length (decreased floor clearance.  ) Gait velocity: decreased Stairs: No Wheelchair Mobility Wheelchair Mobility: No      PT Goals Acute Rehab PT Goals Time For Goal Achievement: 03/30/13 Potential to Achieve Goals: Good Pt will go Supine/Side to Sit: with modified independence PT Goal: Supine/Side to Sit - Progress: Progressing toward goal Pt will go Sit to Supine/Side: with modified independence Pt will Ambulate: >150 feet;with modified independence;with least restrictive assistive device PT Goal: Ambulate - Progress: Progressing toward goal Pt will Go Up / Down Stairs: 3-5 stairs;with supervision;with rail(s)  Visit Information  Last PT Received On: 03/26/13 Assistance Needed: +1    Subjective Data      Cognition  Cognition Arousal/Alertness: Awake/alert Behavior During Therapy: WFL for tasks assessed/performed Overall Cognitive Status: Within Functional Limits for tasks assessed    Balance     End of Session PT - End of Session Equipment Utilized During Treatment: Back brace Activity Tolerance: Patient tolerated treatment well Patient left: in chair;with call bell/phone within reach Nurse Communication: Mobility status     Verdell Face, [place] [phone] 03/26/2013

## 2013-03-26 NOTE — Progress Notes (Signed)
Patient ID: Alexis Mclean, female   DOB: May 12, 1946, 67 y.o.   MRN: [id number] Patient still has a fair amount of back and leg pain.. Continue to mobilize. Continue pain control. He seems to have good strength.

## 2013-03-27 MED ORDER — OXYCODONE-ACETAMINOPHEN 10-325 MG PO TABS: 1.0000 | ORAL_TABLET | ORAL | Status: DC | PRN

## 2013-03-27 MED ORDER — DIAZEPAM 5 MG PO TABS: 5.0000 mg | ORAL_TABLET | Freq: Four times a day (QID) | ORAL | Status: DC | PRN

## 2013-03-27 NOTE — Progress Notes (Signed)
Physical Therapy Treatment Patient Details Name: Alexis Mclean MRN: [id number] DOB: May 02, 1946 Today's Date: 03/27/2013 Time: 4098-1191 PT Time Calculation (min): 24 min  PT Assessment / Plan / Recommendation Comments on Treatment Session       Follow Up Recommendations  Home health PT;Other (comment)     Does the patient have the potential to tolerate intense rehabilitation     Barriers to Discharge        Equipment Recommendations  Rolling walker with 5" wheels    Recommendations for Other Services    Frequency Min 5X/week   Plan Discharge plan remains appropriate    Precautions / Restrictions Precautions Precautions: Back;Fall Required Braces or Orthoses: Spinal Brace Spinal Brace: Applied in sitting position Restrictions Weight Bearing Restrictions: No   Pertinent Vitals/Pain 8/10 back & LE's at end of session.  RN notified for pain meds.     Mobility  Bed Mobility Bed Mobility: Sit to Sidelying Left Sit to Sidelying Left: 4: Min assist;HOB flat Details for Bed Mobility Assistance: (A) to maintain log rolling from side>supine, & (A) to adjust shoulders once in supine position.  Cues for sequencing & technique.   Transfers Transfers: Sit to Stand;Stand to Sit Sit to Stand: 4: Min guard;With armrests;From chair/3-in-1 Stand to Sit: 4: Min guard;With upper extremity assist;With armrests;To chair/3-in-1 Details for Transfer Assistance: Pt cont's to start with hands on RW despite cues to push up from armrests.  Ambulation/Gait Ambulation/Gait Assistance: 5: Supervision Ambulation Distance (Feet): 80 Feet Assistive device: Rolling walker Ambulation/Gait Assistance Details: Pt remains tense but steady  Gait Pattern: Step-through pattern;Decreased stride length;Decreased hip/knee flexion - left;Decreased hip/knee flexion - right Gait velocity: decreased Stairs: Yes Stairs Assistance: 4: Min guard Stair Management Technique: Two rails;Step to  pattern;Forwards Number of Stairs: 3 Wheelchair Mobility Wheelchair Mobility: No     PT Goals Acute Rehab PT Goals Time For Goal Achievement: 03/30/13 Potential to Achieve Goals: Good Pt will go Supine/Side to Sit: with modified independence Pt will go Sit to Supine/Side: with modified independence PT Goal: Sit to Supine/Side - Progress: Progressing toward goal Pt will Ambulate: >150 feet;with modified independence;with least restrictive assistive device PT Goal: Ambulate - Progress: Progressing toward goal Pt will Go Up / Down Stairs: 3-5 stairs;with supervision;with rail(s) PT Goal: Up/Down Stairs - Progress: Progressing toward goal  Visit Information  Last PT Received On: 03/27/13 Assistance Needed: +1    Subjective Data      Cognition  Cognition Arousal/Alertness: Awake/alert Behavior During Therapy: WFL for tasks assessed/performed Overall Cognitive Status: Within Functional Limits for tasks assessed    Balance     End of Session PT - End of Session Equipment Utilized During Treatment: Back brace Activity Tolerance: Patient tolerated treatment well Patient left: with call bell/phone within reach;in bed Nurse Communication: Mobility status;Patient requests pain meds     Verdell Face, [place] [phone] 03/27/2013

## 2013-03-27 NOTE — Discharge Summary (Signed)
Physician Discharge Summary  Patient ID: Alexis Mclean MRN: [id number] DOB/AGE: 1946/04/14 67 y.o.  Admit date: 03/22/2013 Discharge date: 03/27/2013  Admission Diagnoses: L5-S1 spondylolisthesis, spinal stenosis, lumbago, lumbar radiculopathy  Discharge Diagnoses: The same Active Problems:   * No active hospital problems. *   Discharged Condition: good  Hospital Course: Dr. Jeral Fruit admitted the patient on 03/22/2013. On that day performed at L5-S1 decompression, instrumentation, and fusion.  The patient's postoperative course was unremarkable. On 03/27/2013 the patient requested discharge to home. She was given oral and written discharge instructions. All her questions were answered.  Consults: PT OT Significant Diagnostic Studies: None Treatments: L5-S1 decompression, instrumentation, and fusion. Discharge Exam: Blood pressure 106/58, pulse 75, temperature 97.9 F (36.6 C), temperature source Oral, resp. rate 16, height 4\' 11"  (1.499 m), weight 73.483 kg (162 lb), SpO2 98.00%. The patient is alert and oriented. Her strength is grossly normal lower extremities.  Disposition: Home  Discharge Orders   Future Orders Complete By Expires     Call MD for:  difficulty breathing, headache or visual disturbances  As directed     Call MD for:  extreme fatigue  As directed     Call MD for:  hives  As directed     Call MD for:  persistant dizziness or light-headedness  As directed     Call MD for:  persistant nausea and vomiting  As directed     Call MD for:  redness, tenderness, or signs of infection (pain, swelling, redness, odor or green/yellow discharge around incision site)  As directed     Call MD for:  severe uncontrolled pain  As directed     Call MD for:  temperature >100.4  As directed     Diet - low sodium heart healthy  As directed     Discharge instructions  As directed     Comments:      Call [phone] for a followup appointment. Take a stool softener while you are  using pain medications.    Driving Restrictions  As directed     Comments:      Do not drive for 2 weeks.    Increase activity slowly  As directed     Lifting restrictions  As directed     Comments:      Do not lift more than 5 pounds. No excessive bending or twisting.    May shower / Bathe  As directed     Comments:      He may shower after the pain she is removed 3 days after surgery. Leave the incision alone.    No dressing needed  As directed         Medication List    STOP taking these medications       HYDROcodone-acetaminophen 5-325 MG per tablet  Commonly known as:  NORCO/VICODIN     traMADol 50 MG tablet  Commonly known as:  ULTRAM      TAKE these medications       ANTACID PO  Take 1 tablet by mouth daily as needed (indigestion).     diazepam 5 MG tablet  Commonly known as:  VALIUM  Take 1 tablet (5 mg total) by mouth every 6 (six) hours as needed.     loratadine 10 MG tablet  Commonly known as:  CLARITIN  Take 10 mg by mouth daily as needed for allergies.     losartan 25 MG tablet  Commonly known as:  COZAAR  Take 25  mg by mouth every morning.     oxyCODONE-acetaminophen 10-325 MG per tablet  Commonly known as:  PERCOCET  Take 1 tablet by mouth every 4 (four) hours as needed for pain.         SignedCristi Loron 03/27/2013, 9:29 AM

## 2013-11-15 ENCOUNTER — Ambulatory Visit
Admission: RE | Admit: 2013-11-15 | Discharge: 2013-11-15 | Disposition: A | Discharge status: Home / Self Care | Payer: Medicare Other | Source: Ambulatory Visit | Attending: Family Medicine | Admitting: Family Medicine

## 2013-11-15 ENCOUNTER — Other Ambulatory Visit: Payer: Self-pay | Admitting: Family Medicine

## 2013-11-15 DIAGNOSIS — M542 Cervicalgia: Secondary | ICD-10-CM

## 2013-12-04 DIAGNOSIS — S22069A Unspecified fracture of T7-T8 vertebra, initial encounter for closed fracture: Secondary | ICD-10-CM | POA: Insufficient documentation

## 2013-12-04 HISTORY — DX: Unspecified fracture of T7-t8 vertebra, initial encounter for closed fracture: S22.069A

## 2013-12-23 ENCOUNTER — Emergency Department (HOSPITAL_COMMUNITY): Payer: Medicare Other

## 2013-12-23 ENCOUNTER — Emergency Department (HOSPITAL_COMMUNITY)
Admission: EM | Admit: 2013-12-23 | Discharge: 2013-12-23 | Disposition: A | Discharge status: Home / Self Care | Payer: Medicare Other | Attending: Emergency Medicine | Admitting: Emergency Medicine

## 2013-12-23 ENCOUNTER — Encounter (HOSPITAL_COMMUNITY): Payer: Self-pay | Admitting: Emergency Medicine

## 2013-12-23 DIAGNOSIS — Z862 Personal history of diseases of the blood and blood-forming organs and certain disorders involving the immune mechanism: Secondary | ICD-10-CM | POA: Insufficient documentation

## 2013-12-23 DIAGNOSIS — M5380 Other specified dorsopathies, site unspecified: Secondary | ICD-10-CM | POA: Insufficient documentation

## 2013-12-23 DIAGNOSIS — Z880 Allergy status to penicillin: Secondary | ICD-10-CM | POA: Insufficient documentation

## 2013-12-23 DIAGNOSIS — Y929 Unspecified place or not applicable: Secondary | ICD-10-CM | POA: Insufficient documentation

## 2013-12-23 DIAGNOSIS — I10 Essential (primary) hypertension: Secondary | ICD-10-CM | POA: Insufficient documentation

## 2013-12-23 DIAGNOSIS — G8929 Other chronic pain: Secondary | ICD-10-CM | POA: Insufficient documentation

## 2013-12-23 DIAGNOSIS — S22000A Wedge compression fracture of unspecified thoracic vertebra, initial encounter for closed fracture: Secondary | ICD-10-CM

## 2013-12-23 DIAGNOSIS — Z8639 Personal history of other endocrine, nutritional and metabolic disease: Secondary | ICD-10-CM | POA: Insufficient documentation

## 2013-12-23 DIAGNOSIS — M62830 Muscle spasm of back: Secondary | ICD-10-CM

## 2013-12-23 DIAGNOSIS — S22009A Unspecified fracture of unspecified thoracic vertebra, initial encounter for closed fracture: Secondary | ICD-10-CM | POA: Insufficient documentation

## 2013-12-23 DIAGNOSIS — Z91040 Latex allergy status: Secondary | ICD-10-CM | POA: Insufficient documentation

## 2013-12-23 DIAGNOSIS — X58XXXA Exposure to other specified factors, initial encounter: Secondary | ICD-10-CM | POA: Insufficient documentation

## 2013-12-23 DIAGNOSIS — Y9389 Activity, other specified: Secondary | ICD-10-CM | POA: Insufficient documentation

## 2013-12-23 DIAGNOSIS — F17200 Nicotine dependence, unspecified, uncomplicated: Secondary | ICD-10-CM | POA: Insufficient documentation

## 2013-12-23 DIAGNOSIS — Z79899 Other long term (current) drug therapy: Secondary | ICD-10-CM | POA: Insufficient documentation

## 2013-12-23 LAB — CBC WITH DIFFERENTIAL/PLATELET
BASOS ABS: 0 10*3/uL (ref 0.0–0.1)
BASOS PCT: 0 % (ref 0–1)
EOS PCT: 1 % (ref 0–5)
Eosinophils Absolute: 0.2 10*3/uL (ref 0.0–0.7)
HEMATOCRIT: 43.4 % (ref 36.0–46.0)
Hemoglobin: 14.6 g/dL (ref 12.0–15.0)
Lymphocytes Relative: 25 % (ref 12–46)
Lymphs Abs: 3 10*3/uL (ref 0.7–4.0)
MCH: 28 pg (ref 26.0–34.0)
MCHC: 33.6 g/dL (ref 30.0–36.0)
MCV: 83.3 fL (ref 78.0–100.0)
MONO ABS: 0.8 10*3/uL (ref 0.1–1.0)
Monocytes Relative: 7 % (ref 3–12)
NEUTROS ABS: 7.9 10*3/uL — AB (ref 1.7–7.7)
Neutrophils Relative %: 67 % (ref 43–77)
PLATELETS: 336 10*3/uL (ref 150–400)
RBC: 5.21 MIL/uL — ABNORMAL HIGH (ref 3.87–5.11)
RDW: 13.6 % (ref 11.5–15.5)
WBC: 11.8 10*3/uL — ABNORMAL HIGH (ref 4.0–10.5)

## 2013-12-23 LAB — URINALYSIS, ROUTINE W REFLEX MICROSCOPIC
Bilirubin Urine: NEGATIVE
GLUCOSE, UA: NEGATIVE mg/dL
HGB URINE DIPSTICK: NEGATIVE
Ketones, ur: NEGATIVE mg/dL
Nitrite: NEGATIVE
PH: 6 (ref 5.0–8.0)
Protein, ur: NEGATIVE mg/dL
SPECIFIC GRAVITY, URINE: 1.018 (ref 1.005–1.030)
Urobilinogen, UA: 0.2 mg/dL (ref 0.0–1.0)

## 2013-12-23 LAB — COMPREHENSIVE METABOLIC PANEL
ALBUMIN: 4.1 g/dL (ref 3.5–5.2)
ALT: 18 U/L (ref 0–35)
AST: 16 U/L (ref 0–37)
Alkaline Phosphatase: 107 U/L (ref 39–117)
BUN: 14 mg/dL (ref 6–23)
CALCIUM: 9.6 mg/dL (ref 8.4–10.5)
CHLORIDE: 100 meq/L (ref 96–112)
CO2: 26 mEq/L (ref 19–32)
CREATININE: 0.94 mg/dL (ref 0.50–1.10)
GFR calc Af Amer: 71 mL/min — ABNORMAL LOW (ref >90)
GFR calc non Af Amer: 61 mL/min — ABNORMAL LOW (ref >90)
Glucose, Bld: 107 mg/dL — ABNORMAL HIGH (ref 70–99)
Potassium: 3.6 mEq/L — ABNORMAL LOW (ref 3.7–5.3)
SODIUM: 140 meq/L (ref 137–147)
Total Bilirubin: <0.2 mg/dL — ABNORMAL LOW (ref 0.3–1.2)
Total Protein: 7.5 g/dL (ref 6.0–8.3)

## 2013-12-23 LAB — URINE MICROSCOPIC-ADD ON

## 2013-12-23 MED ORDER — FENTANYL CITRATE 0.05 MG/ML IJ SOLN
25.0000 ug | Freq: Once | INTRAMUSCULAR | Status: AC
  Administered 2013-12-23: 25 ug via INTRAVENOUS
  Filled 2013-12-23: qty 2

## 2013-12-23 MED ORDER — OXYCODONE-ACETAMINOPHEN 5-325 MG PO TABS: 1.0000 | ORAL_TABLET | Freq: Four times a day (QID) | ORAL | Status: DC | PRN

## 2013-12-23 MED ORDER — DIAZEPAM 5 MG/ML IJ SOLN
2.5000 mg | Freq: Once | INTRAMUSCULAR | Status: AC
  Administered 2013-12-23: 2.5 mg via INTRAVENOUS
  Filled 2013-12-23: qty 2

## 2013-12-23 MED ORDER — DIAZEPAM 2 MG PO TABS: 2.0000 mg | ORAL_TABLET | Freq: Three times a day (TID) | ORAL | Status: DC | PRN

## 2013-12-23 MED ORDER — FENTANYL CITRATE 0.05 MG/ML IJ SOLN
50.0000 ug | Freq: Once | INTRAMUSCULAR | Status: AC
  Administered 2013-12-23: 50 ug via INTRAVENOUS
  Filled 2013-12-23: qty 2

## 2013-12-23 NOTE — ED Notes (Signed)
Patient up to bathroom for urine sample

## 2013-12-23 NOTE — Discharge Instructions (Signed)
Back, Compression Fracture °A compression fracture happens when a force is put upon the length of your spine. Slipping and falling on your bottom are examples of such a force. When this happens, sometimes the force is great enough to compress the building blocks (vertebral bodies) of your spine. Although this causes a lot of pain, this can usually be treated at home, unless your caregiver feels hospitalization is needed for pain control. °Your backbone (spinal column) is made up of 24 main vertebral bodies in addition to the sacrum and coccyx (see illustration). These are held together by tough fibrous tissues (ligaments) and by support of your muscles. Nerve roots pass through the openings between the vertebrae. A sudden wrenching move, injury, or a fall may cause a compression fracture of one of the vertebral bodies. This may result in back pain or spread of pain into the belly (abdomen), the buttocks, and down the leg into the foot. Pain may also be created by muscle spasm alone. °Large studies have been undertaken to determine the best possible course of action to help your back following injury and also to prevent future problems. The recommendations are as follows. °FOLLOWING A COMPRESSION FRACTURE: °Do the following only if advised by your caregiver.  °· If a back brace has been suggested or provided, wear it as directed. °· DO NOT stop wearing the back brace unless instructed by your caregiver. °· When allowed to return to regular activities, avoid a sedentary life style. Actively exercise. Sporadic weekend binges of tennis, racquetball, water skiing, may actually aggravate or create problems, especially if you are not in condition for that activity. °· Avoid sports requiring sudden body movements until you are in condition for them. Swimming and walking are safer activities. °· Maintain good posture. °· Avoid obesity. °· If not already done, you should have a DEXA scan. Based on the results, be treated for  osteoporosis. °FOLLOWING ACUTE (SUDDEN) INJURY: °· Only take over-the-counter or prescription medicines for pain, discomfort, or fever as directed by your caregiver. °· Use bed rest for only the most extreme acute episode. Prolonged bed rest may aggravate your condition. Ice used for acute conditions is effective. Use a large plastic bag filled with ice. Wrap it in a towel. This also provides excellent pain relief. This may be continuous. Or use it for 30 minutes every 2 hours during acute phase, then as needed. Heat for 30 minutes prior to activities is helpful. °· As soon as the acute phase (the time when your back is too painful for you to do normal activities) is over, it is important to resume normal activities and work hardening programs. Back injuries can cause potentially marked changes in lifestyle. So it is important to attack these problems aggressively. °· See your caregiver for continued problems. He or she can help or refer you for appropriate exercises, physical therapy and work hardening if needed. °· If you are given narcotic medications for your condition, for the next 24 hours DO NOT: °· Drive °· Operate machinery or power tools. °· Sign legal documents. °· DO NOT drink alcohol, take sleeping pills or other medications that may interfere with treatment. °If your caregiver has given you a follow-up appointment, it is very important to keep that appointment. Not keeping the appointment could result in a chronic or permanent injury, pain, and disability. If there is any problem keeping the appointment, you must call back to this facility for assistance.  °SEEK IMMEDIATE MEDICAL CARE IF: °· You develop numbness,   tingling, weakness, or problems with the use of your arms or legs. °· You develop severe back pain not relieved with medications. °· You have changes in bowel or bladder control. °· You have increasing pain in any areas of the body. °Document Released: 10/20/2005 Document Revised: 01/12/2012  Document Reviewed: 05/24/2008 °[employer]® Patient Information ©2014 [employer]. ° °

## 2013-12-23 NOTE — ED Notes (Signed)
Pt reports generalized pain and spasms. Pt reports a history of spinal procedures and a fall in her childhood. Pt states the pain has been present and has been uncharged since 12/06/2013. Pt is A/O x4, in NAD, and vitals are WDL.

## 2013-12-23 NOTE — ED Provider Notes (Signed)
TIME SEEN: 1:21 PM  CHIEF COMPLAINT: Back pain  HPI: Patient is a 68 year old female with a history of hypertension, hyperlipidemia, chronic back pain and prior posterior L5-S1 fusion who presents the emergency department with 2 weeks of thoracic and lumbar back spasms that are worse with movement and better with rest. She's been taking tizanidine and oxycodone at home prescribed by her PCP, Dr. Kenton Kingfisher with [facility] physicians without relief. She denies any known injury but states the day prior to her back pain starting she was on the floor cleaning out a cabinet in her house. She denies any new numbness, tingling or focal weakness. No bowel or bladder incontinence. No urinary retention. No fever. Denies any chest pain or shortness of breath. No abdominal pain. No vomiting or diarrhea. Patient reports she has been able to ambulate.  ROS: See HPI Constitutional: no fever  Eyes: no drainage  ENT: no runny nose   Cardiovascular:  no chest pain  Resp: no SOB  GI: no vomiting GU: no dysuria Integumentary: no rash  Allergy: no hives  Musculoskeletal: no leg swelling  Neurological: no slurred speech ROS otherwise negative  PAST MEDICAL HISTORY/PAST SURGICAL HISTORY:  Past Medical History  Diagnosis Date  . Hyperlipemia   . Neuropathic pain of chest   . Hypertension     MEDICATIONS:  Prior to Admission medications   Medication Sig Start Date End Date Taking? Authorizing Provider  Calcium Carbonate Antacid (ANTACID PO) Take 1 tablet by mouth daily as needed (indigestion).   Yes Historical Provider, MD  loratadine (CLARITIN) 10 MG tablet Take 10 mg by mouth daily as needed for allergies.   Yes Historical Provider, MD  losartan (COZAAR) 25 MG tablet Take 25 mg by mouth every morning.  01/20/13  Yes Historical Provider, MD  oxycodone (OXY-IR) 5 MG capsule Take 2.5-5 mg by mouth every 4 (four) hours as needed for pain.    Yes Historical Provider, MD  tiZANidine (ZANAFLEX) 4 MG tablet Take 4 mg by  mouth every 6 (six) hours as needed for muscle spasms.   Yes Historical Provider, MD    ALLERGIES:  Allergies  Allergen Reactions  . Latex Rash  . Lyrica [Pregabalin]     unknown  . Morphine And Related     BP dropped  . Neurontin [Gabapentin]     Room spun  . Penicillins Diarrhea and Other (See Comments)    Cramping in abd.took recently no problems  . Sulfa Drugs Cross Reactors Nausea Only    Really sick feeling  . Xanax [Alprazolam]     insomnia    SOCIAL HISTORY:  History  Substance Use Topics  . Smoking status: Current Every Day Smoker -- 1.33 packs/day for 30 years    Types: Cigarettes  . Smokeless tobacco: Never Used  . Alcohol Use: No    FAMILY HISTORY: Family History  Problem Relation Age of Onset  . Stroke Mother   . Diabetes Mother   . Diabetes Father   . Emphysema Father   . Cancer Father     Lung  . Asthma Father   . Diabetes Sister   . Diabetes Brother     EXAM: BP 151/79  Pulse 87  Temp(Src) 98.3 F (36.8 C) (Oral)  Resp 16  SpO2 95% CONSTITUTIONAL: Alert and oriented and responds appropriately to questions. Well-appearing; well-nourished HEAD: Normocephalic EYES: Conjunctivae clear, PERRL ENT: normal nose; no rhinorrhea; moist mucous membranes; pharynx without lesions noted NECK: Supple, no meningismus, no LAD  CARD: RRR;  S1 and S2 appreciated; no murmurs, no clicks, no rubs, no gallops RESP: Normal chest excursion without splinting or tachypnea; breath sounds clear and equal bilaterally; no wheezes, no rhonchi, no rales, tender to palpation over bilateral rib cage without skin changes, crepitus or ecchymosis ABD/GI: Normal bowel sounds; non-distended; soft, non-tender, no rebound, no guarding BACK:  The back appears normal; patient has lower thoracic and upper lumbar midline spinal tenderness without step-off or deformity was also very tender to palpation over the right paraspinal musculature with spasm being EXT: Normal ROM in all joints;  non-tender to palpation; no edema; normal capillary refill; no cyanosis    SKIN: Normal color for age and race; warm NEURO: Moves all extremities equally, strength 5/5 in all 4 extremities in all muscle groups, sensation to light touch intact diffusely, 2+ deep tendon reflexes in bilateral upper and lower extremities, normal gait, patient ambulates slowly PSYCH: The patient's mood and manner are appropriate. Grooming and personal hygiene are appropriate.  MEDICAL DECISION MAKING: Patient here with muscle spasms, musculoskeletal pain. Given her age and comorbidities, we'll check basic labs and urine and obtain x-rays. We'll give fentanyl, Valium and reassess.  Given patient is neurologically intact, afebrile, low suspicion for spinal stenosis or cauda equina, transverse myelitis, discitis, epidural abscess.  ED PROGRESS: Patient's thoracic x-ray shows mild wedge compression deformity of T8 vertebra which may be acute. We'll obtain CT scan to further evaluate.  2:37 PM  Pain has improved significantly after fentanyl and valium.   3:16 PM  CT scan shows mild T8 biconcave compression fracture and without bony retropulsion or extension into posterior elements. Fracture appears acute/subacute. No other fracture identified. Suspect this is the cause of patient's symptoms. Labs are unremarkable other than mild leukocytosis which may be reactive. Urinalysis pending.  3:56 PM  Pt's urine shows small leukocytes but no other sign of infection.  Culture pending.  I do not feel UTI or pyelo is the cause of her pain.  She denies any urinary symptoms. We'll hold on treatment at this time. Suspect her T8 compression fractures the cause of her pain. Have given strict return precautions. We'll discharge with prescription in for more Percocet and Valium. Patient will followup with her primary care physician for further pain management. Patient and daughter verbalize understanding and are comfortable with plan.  [place], DO 12/23/13 1557

## 2013-12-24 LAB — URINE CULTURE

## 2014-03-03 DIAGNOSIS — S22089A Unspecified fracture of T11-T12 vertebra, initial encounter for closed fracture: Secondary | ICD-10-CM

## 2014-03-03 HISTORY — DX: Unspecified fracture of t11-T12 vertebra, initial encounter for closed fracture: S22.089A

## 2014-03-24 ENCOUNTER — Emergency Department (HOSPITAL_COMMUNITY): Payer: Medicare Other

## 2014-03-24 ENCOUNTER — Emergency Department (HOSPITAL_COMMUNITY)
Admission: EM | Admit: 2014-03-24 | Discharge: 2014-03-24 | Disposition: A | Discharge status: Home / Self Care | Payer: Medicare Other | Attending: Emergency Medicine | Admitting: Emergency Medicine

## 2014-03-24 ENCOUNTER — Encounter (HOSPITAL_COMMUNITY): Payer: Self-pay | Admitting: Emergency Medicine

## 2014-03-24 DIAGNOSIS — Z9089 Acquired absence of other organs: Secondary | ICD-10-CM | POA: Insufficient documentation

## 2014-03-24 DIAGNOSIS — Z90710 Acquired absence of both cervix and uterus: Secondary | ICD-10-CM | POA: Insufficient documentation

## 2014-03-24 DIAGNOSIS — Z880 Allergy status to penicillin: Secondary | ICD-10-CM | POA: Insufficient documentation

## 2014-03-24 DIAGNOSIS — M549 Dorsalgia, unspecified: Secondary | ICD-10-CM

## 2014-03-24 DIAGNOSIS — N390 Urinary tract infection, site not specified: Secondary | ICD-10-CM | POA: Insufficient documentation

## 2014-03-24 DIAGNOSIS — G8929 Other chronic pain: Secondary | ICD-10-CM | POA: Insufficient documentation

## 2014-03-24 DIAGNOSIS — R109 Unspecified abdominal pain: Secondary | ICD-10-CM

## 2014-03-24 DIAGNOSIS — Z91040 Latex allergy status: Secondary | ICD-10-CM | POA: Insufficient documentation

## 2014-03-24 DIAGNOSIS — M546 Pain in thoracic spine: Secondary | ICD-10-CM | POA: Insufficient documentation

## 2014-03-24 DIAGNOSIS — Z8639 Personal history of other endocrine, nutritional and metabolic disease: Secondary | ICD-10-CM | POA: Insufficient documentation

## 2014-03-24 DIAGNOSIS — Z9079 Acquired absence of other genital organ(s): Secondary | ICD-10-CM | POA: Insufficient documentation

## 2014-03-24 DIAGNOSIS — F17200 Nicotine dependence, unspecified, uncomplicated: Secondary | ICD-10-CM | POA: Insufficient documentation

## 2014-03-24 DIAGNOSIS — I10 Essential (primary) hypertension: Secondary | ICD-10-CM | POA: Insufficient documentation

## 2014-03-24 DIAGNOSIS — Z862 Personal history of diseases of the blood and blood-forming organs and certain disorders involving the immune mechanism: Secondary | ICD-10-CM | POA: Insufficient documentation

## 2014-03-24 DIAGNOSIS — Z79899 Other long term (current) drug therapy: Secondary | ICD-10-CM | POA: Insufficient documentation

## 2014-03-24 LAB — CBC WITH DIFFERENTIAL/PLATELET
Basophils Absolute: 0 10*3/uL (ref 0.0–0.1)
Basophils Relative: 0 % (ref 0–1)
EOS ABS: 0 10*3/uL (ref 0.0–0.7)
Eosinophils Relative: 0 % (ref 0–5)
HCT: 45.2 % (ref 36.0–46.0)
HEMOGLOBIN: 15.1 g/dL — AB (ref 12.0–15.0)
Lymphocytes Relative: 13 % (ref 12–46)
Lymphs Abs: 1.5 10*3/uL (ref 0.7–4.0)
MCH: 27.5 pg (ref 26.0–34.0)
MCHC: 33.4 g/dL (ref 30.0–36.0)
MCV: 82.2 fL (ref 78.0–100.0)
MONOS PCT: 6 % (ref 3–12)
Monocytes Absolute: 0.7 10*3/uL (ref 0.1–1.0)
Neutro Abs: 9.7 10*3/uL — ABNORMAL HIGH (ref 1.7–7.7)
Neutrophils Relative %: 81 % — ABNORMAL HIGH (ref 43–77)
Platelets: 407 10*3/uL — ABNORMAL HIGH (ref 150–400)
RBC: 5.5 MIL/uL — ABNORMAL HIGH (ref 3.87–5.11)
RDW: 13.9 % (ref 11.5–15.5)
WBC: 12 10*3/uL — ABNORMAL HIGH (ref 4.0–10.5)

## 2014-03-24 LAB — COMPREHENSIVE METABOLIC PANEL
ALK PHOS: 95 U/L (ref 39–117)
ALT: 16 U/L (ref 0–35)
AST: 15 U/L (ref 0–37)
Albumin: 3.9 g/dL (ref 3.5–5.2)
BUN: 12 mg/dL (ref 6–23)
CALCIUM: 10 mg/dL (ref 8.4–10.5)
CO2: 24 mEq/L (ref 19–32)
Chloride: 99 mEq/L (ref 96–112)
Creatinine, Ser: 0.92 mg/dL (ref 0.50–1.10)
GFR calc non Af Amer: 63 mL/min — ABNORMAL LOW (ref >90)
GFR, EST AFRICAN AMERICAN: 72 mL/min — AB (ref >90)
GLUCOSE: 194 mg/dL — AB (ref 70–99)
Potassium: 3.9 mEq/L (ref 3.7–5.3)
Sodium: 140 mEq/L (ref 137–147)
TOTAL PROTEIN: 7.2 g/dL (ref 6.0–8.3)
Total Bilirubin: 0.2 mg/dL — ABNORMAL LOW (ref 0.3–1.2)

## 2014-03-24 LAB — URINE MICROSCOPIC-ADD ON

## 2014-03-24 LAB — URINALYSIS, ROUTINE W REFLEX MICROSCOPIC
Bilirubin Urine: NEGATIVE
Glucose, UA: NEGATIVE mg/dL
Hgb urine dipstick: NEGATIVE
KETONES UR: NEGATIVE mg/dL
NITRITE: POSITIVE — AB
Protein, ur: 30 mg/dL — AB
SPECIFIC GRAVITY, URINE: 1.016 (ref 1.005–1.030)
Urobilinogen, UA: 1 mg/dL (ref 0.0–1.0)
pH: 8 (ref 5.0–8.0)

## 2014-03-24 LAB — LIPASE, BLOOD: LIPASE: 22 U/L (ref 11–59)

## 2014-03-24 MED ORDER — SODIUM CHLORIDE 0.9 % IV SOLN: INTRAVENOUS | Status: DC

## 2014-03-24 MED ORDER — DICYCLOMINE HCL 10 MG PO CAPS
20.0000 mg | ORAL_CAPSULE | Freq: Once | ORAL | Status: AC
  Administered 2014-03-24: 20 mg via ORAL
  Filled 2014-03-24: qty 2

## 2014-03-24 MED ORDER — DICYCLOMINE HCL 20 MG PO TABS: 20.0000 mg | ORAL_TABLET | Freq: Three times a day (TID) | ORAL | Status: DC

## 2014-03-24 MED ORDER — SODIUM CHLORIDE 0.9 % IV BOLUS (SEPSIS)
1000.0000 mL | Freq: Once | INTRAVENOUS | Status: AC
  Administered 2014-03-24: 1000 mL via INTRAVENOUS

## 2014-03-24 MED ORDER — FOSFOMYCIN TROMETHAMINE 3 G PO PACK
3.0000 g | PACK | Freq: Once | ORAL | Status: AC
  Administered 2014-03-24: 3 g via ORAL
  Filled 2014-03-24: qty 3

## 2014-03-24 NOTE — ED Notes (Signed)
Patient transported to X-ray 

## 2014-03-24 NOTE — ED Notes (Addendum)
Pt c/o abd pain x 6 weeks, was treated for h. pylori about 4 weeks ago and pt states she think that made it worse. Denies n/v/d, states her abd just hurts and she cannot eat. Pt took 7 1/2 mg oxycodone around 0715.

## 2014-03-24 NOTE — Discharge Instructions (Signed)
Abdominal Pain, Adult Many things can cause abdominal pain. Usually, abdominal pain is not caused by a disease and will improve without treatment. It can often be observed and treated at home. Your health care provider will do a physical exam and possibly order blood tests and X-rays to help determine the seriousness of your pain. However, in many cases, more time must pass before a clear cause of the pain can be found. Before that point, your health care provider may not know if you need more testing or further treatment. HOME CARE INSTRUCTIONS  Monitor your abdominal pain for any changes. The following actions may help to alleviate any discomfort you are experiencing:  Only take over-the-counter or prescription medicines as directed by your health care provider.  Do not take laxatives unless directed to do so by your health care provider.  Try a clear liquid diet (broth, tea, or water) as directed by your health care provider. Slowly move to a bland diet as tolerated. SEEK MEDICAL CARE IF:  You have unexplained abdominal pain.  You have abdominal pain associated with nausea or diarrhea.  You have pain when you urinate or have a bowel movement.  You experience abdominal pain that wakes you in the night.  You have abdominal pain that is worsened or improved by eating food.  You have abdominal pain that is worsened with eating fatty foods. SEEK IMMEDIATE MEDICAL CARE IF:   Your pain does not go away within 2 hours.  You have a fever.  You keep throwing up (vomiting).  Your pain is felt only in portions of the abdomen, such as the right side or the left lower portion of the abdomen.  You pass bloody or black tarry stools. MAKE SURE YOU:  Understand these instructions.   Will watch your condition.   Will get help right away if you are not doing well or get worse.  Document Released: 07/30/2005 Document Revised: 08/10/2013 Document Reviewed: 06/29/2013 [facility] Patient  Information 2014 [employer].  Urinary Tract Infection Urinary tract infections (UTIs) can develop anywhere along your urinary tract. Your urinary tract is your body's drainage system for removing wastes and extra water. Your urinary tract includes two kidneys, two ureters, a bladder, and a urethra. Your kidneys are a pair of bean-shaped organs. Each kidney is about the size of your fist. They are located below your ribs, one on each side of your spine. CAUSES Infections are caused by microbes, which are microscopic organisms, including fungi, viruses, and bacteria. These organisms are so small that they can only be seen through a microscope. Bacteria are the microbes that most commonly cause UTIs. SYMPTOMS  Symptoms of UTIs may vary by age and gender of the patient and by the location of the infection. Symptoms in young women typically include a frequent and intense urge to urinate and a painful, burning feeling in the bladder or urethra during urination. Older women and men are more likely to be tired, shaky, and weak and have muscle aches and abdominal pain. A fever may mean the infection is in your kidneys. Other symptoms of a kidney infection include pain in your back or sides below the ribs, nausea, and vomiting. DIAGNOSIS To diagnose a UTI, your caregiver will ask you about your symptoms. Your caregiver also will ask to provide a urine sample. The urine sample will be tested for bacteria and white blood cells. White blood cells are made by your body to help fight infection. TREATMENT  Typically, UTIs can be  treated with medication. Because most UTIs are caused by a bacterial infection, they usually can be treated with the use of antibiotics. The choice of antibiotic and length of treatment depend on your symptoms and the type of bacteria causing your infection. °HOME CARE INSTRUCTIONS °· If you were prescribed antibiotics, take them exactly as your caregiver instructs you. Finish the medication  even if you feel better after you have only taken some of the medication. °· Drink enough water and fluids to keep your urine clear or pale yellow. °· Avoid caffeine, tea, and carbonated beverages. They tend to irritate your bladder. °· Empty your bladder often. Avoid holding urine for long periods of time. °· Empty your bladder before and after sexual intercourse. °· After a bowel movement, women should cleanse from front to back. Use each tissue only once. °SEEK MEDICAL CARE IF:  °· You have back pain. °· You develop a fever. °· Your symptoms do not begin to resolve within 3 days. °SEEK IMMEDIATE MEDICAL CARE IF:  °· You have severe back pain or lower abdominal pain. °· You develop chills. °· You have nausea or vomiting. °· You have continued burning or discomfort with urination. °MAKE SURE YOU:  °· Understand these instructions. °· Will watch your condition. °· Will get help right away if you are not doing well or get worse. °Document Released: 07/30/2005 Document Revised: 04/20/2012 Document Reviewed: 11/28/2011 °[employer]® Patient Information ©2014 [employer]. ° °

## 2014-03-24 NOTE — ED Notes (Signed)
MD at bedside. 

## 2014-03-24 NOTE — ED Provider Notes (Signed)
CSN: [id number]     Arrival date & time 03/24/14  0910 History   First MD Initiated Contact with Patient 03/24/14 0932     Chief Complaint  Patient presents with  . Abdominal Pain     (Consider location/radiation/quality/duration/timing/severity/associated sxs/prior Treatment) HPI  Alexis Mclean is a 68 y.o. female who presents for evaluation of upper abdominal pain, associated with chronic nausea, anorexia, and upper back, pain. She feels like her stomach and small intestines are cramping, and this has been exacerbated by a recent treatment for H. pylori disease, using doxycycline, and moxifloxacin. These were treatment initiated by her primary care doctor. She has subsequently been referred to a gastrointestinal doctor and that appointment is scheduled for June 3. The patient felt bad and could not wait for the appointment, so came here for evaluation. She reports having upper back, pain, for 16 years, after a fall. She also has dyspnea that is associated with her arms, being in a neutral position, while walking. If she walks, on a treadmill, with her arms elevated, she does not have shortness of breath. She denies cough, sputum production, orthopnea, dizziness, paresthesias, headache, neck pain or gait instability. Her stools have been less than usual, because of her anorexia. She denies constipation. She has had urinary discomfort, that she describes as pain while voiding. She has not had urinary frequency, hematuria or a sensation of dysuria. There are no other known modifying factors.   Past Medical History  Diagnosis Date  . Hyperlipemia   . Neuropathic pain of chest   . Hypertension    Past Surgical History  Procedure Laterality Date  . Abdominal hysterectomy    . Bladder repair    . Wisdom tooth extraction    . Tonsillectomy    . Appendectomy    . Salpingoorrphorectomy Right    Family History  Problem Relation Age of Onset  . Stroke Mother   . Diabetes Mother   .  Diabetes Father   . Emphysema Father   . Cancer Father     Lung  . Asthma Father   . Diabetes Sister   . Diabetes Brother    History  Substance Use Topics  . Smoking status: Current Every Day Smoker -- 1.33 packs/day for 30 years    Types: Cigarettes  . Smokeless tobacco: Never Used  . Alcohol Use: No   OB History   Grav Para Term Preterm Abortions TAB SAB Ect Mult Living                 Review of Systems  All other systems reviewed and are negative.     Allergies  Latex; Lyrica; Morphine and related; Neurontin; Penicillins; Sulfa drugs cross reactors; and Xanax  Home Medications   Prior to Admission medications   Medication Sig Start Date End Date Taking? Authorizing Provider  Calcium Carbonate Antacid (ANTACID PO) Take 1 tablet by mouth daily as needed (indigestion).    Historical Provider, MD  diazepam (VALIUM) 2 MG tablet Take 1 tablet (2 mg total) by mouth every 8 (eight) hours as needed for muscle spasms. 12/23/13   Kristen N Ward, DO  loratadine (CLARITIN) 10 MG tablet Take 10 mg by mouth daily as needed for allergies.    Historical Provider, MD  losartan (COZAAR) 25 MG tablet Take 25 mg by mouth every morning.  01/20/13   Historical Provider, MD  oxycodone (OXY-IR) 5 MG capsule Take 2.5-5 mg by mouth every 4 (four) hours as needed for pain.  Historical Provider, MD  oxyCODONE-acetaminophen (PERCOCET/ROXICET) 5-325 MG per tablet Take 1-2 tablets by mouth every 6 (six) hours as needed. 12/23/13   Kristen N Ward, DO  tiZANidine (ZANAFLEX) 4 MG tablet Take 4 mg by mouth every 6 (six) hours as needed for muscle spasms.    Historical Provider, MD   BP 159/97  Pulse 95  Temp(Src) 97.9 F (36.6 C) (Oral)  Resp 20  SpO2 100% Physical Exam  Nursing note and vitals reviewed. Constitutional: She is oriented to person, place, and time. She appears well-developed and well-nourished.  HENT:  Head: Normocephalic and atraumatic.  Eyes: Conjunctivae and EOM are normal.  Pupils are equal, round, and reactive to light.  Neck: Normal range of motion and phonation normal. Neck supple.  Cardiovascular: Normal rate, regular rhythm and intact distal pulses.   Pulmonary/Chest: Effort normal and breath sounds normal. She exhibits no tenderness.  Abdominal: Soft. She exhibits no distension and no mass. There is tenderness (Diffuse, mild). There is no rebound and no guarding.  Musculoskeletal: Normal range of motion. She exhibits no edema.  Mild diffuse tenderness of the paravertebral and spine (thoracic and lumbar) regions.  Neurological: She is alert and oriented to person, place, and time. She exhibits normal muscle tone.  Skin: Skin is warm and dry.  Psychiatric: Her behavior is normal. Judgment and thought content normal.  She appears depressed    ED Course  Procedures (including critical care time)  Medications  0.9 %  sodium chloride infusion (not administered)  fosfomycin (MONUROL) packet 3 g (not administered)  sodium chloride 0.9 % bolus 1,000 mL (0 mLs Intravenous Stopped 03/24/14 1145)  dicyclomine (BENTYL) capsule 20 mg (20 mg Oral Given 03/24/14 1233)    Patient Vitals for the past 24 hrs:  BP Temp Temp src Pulse Resp SpO2  03/24/14 0918 159/97 mmHg 97.9 F (36.6 C) Oral 95 20 100 %    12:35 PM Reevaluation with update and discussion. After initial assessment and treatment, an updated evaluation reveals she is having abdominal cramping. Treated with Bentyl. Findings discussed with pt and family member, all questions answered.Richarda Blade    Labs Review Labs Reviewed  CBC WITH DIFFERENTIAL - Abnormal; Notable for the following:    WBC 12.0 (*)    RBC 5.50 (*)    Hemoglobin 15.1 (*)    Platelets 407 (*)    Neutrophils Relative % 81 (*)    Neutro Abs 9.7 (*)    All other components within normal limits  COMPREHENSIVE METABOLIC PANEL - Abnormal; Notable for the following:    Glucose, Bld 194 (*)    Total Bilirubin 0.2 (*)    GFR calc  non Af Amer 63 (*)    GFR calc Af Amer 72 (*)    All other components within normal limits  URINALYSIS, ROUTINE W REFLEX MICROSCOPIC - Abnormal; Notable for the following:    Color, Urine ORANGE (*)    APPearance TURBID (*)    Protein, ur 30 (*)    Nitrite POSITIVE (*)    Leukocytes, UA LARGE (*)    All other components within normal limits  URINE MICROSCOPIC-ADD ON - Abnormal; Notable for the following:    Bacteria, UA MANY (*)    Crystals TRIPLE PHOSPHATE CRYSTALS (*)    All other components within normal limits  URINE CULTURE  LIPASE, BLOOD    Imaging Review Dg Chest 2 View  03/24/2014   CLINICAL DATA:  Abdominal pain and hypertension; long-term tobacco use  EXAM: CHEST  2 VIEW  COMPARISON:  PA and lateral chest x-ray of December 23, 2013.  FINDINGS: The heart size and mediastinal contours are within normal limits. There is tortuosity of the descending thoracic aorta.Both lungs are clear hyperinflated. There is stable wedge compression of two mid thoracic vertebral bodies.  IMPRESSION: Hyperinflation consistent with COPD. There is no evidence of acute cardiopulmonary abnormality.   Electronically Signed   By: David  Martinique   On: 03/24/2014 10:51     EKG Interpretation None      MDM   Final diagnoses:  UTI (urinary tract infection)  Abdominal pain  Chronic back pain    Chronic pain, abdomen, chest, low back, and new onset urinary tract symptoms with evidence for urinary tract infection. Treated with single dose treatment for urinary tract infection, to minimize gastric distress.  Doubt sepsis, pyelonephritis, metabolic instability, or impending vascular collapse. Also, doubt acute colitis, peptic ulcer disease, bowel  Obstruction.  Nursing Notes Reviewed/ Care Coordinated Applicable Imaging Reviewed Interpretation of Laboratory Data incorporated into ED treatment  The patient appears reasonably screened and/or stabilized for discharge and I doubt any other medical  condition or other [facility] requiring further screening, evaluation, or treatment in the ED at this time prior to discharge.  Plan: Home Medications- Bentyl; Home Treatments- rest, push fluids; return here if the recommended treatment, does not improve the symptoms; Recommended follow up- PCP prn. GI as scheduled on 04/05/14    Richarda Blade, MD 03/24/14 1258

## 2014-03-27 LAB — URINE CULTURE: Colony Count: >=100000

## 2014-03-28 ENCOUNTER — Telehealth (HOSPITAL_BASED_OUTPATIENT_CLINIC_OR_DEPARTMENT_OTHER): Payer: Self-pay | Admitting: Emergency Medicine

## 2014-03-28 NOTE — Telephone Encounter (Signed)
Post ED Visit - Positive Culture Follow-up  Culture report reviewed by antimicrobial stewardship pharmacist: []  Wes Nanuet, Pharm.D., BCPS []  Heide Guile, Pharm.D., BCPS []  Alycia Rossetti, Pharm.D., BCPS []  Felton, Pharm.D., BCPS, AAHIVP []  Legrand Como, Pharm.D., BCPS, AAHIVP []  Juliene Pina, Pharm.D. [x]  Assunta Curtis, Pharm.D.  Positive urine culture Per pharmacist, no treatment needed and no further patient follow-up is required at this time.  Jailon Schaible 03/28/2014, 1:06 PM

## 2014-03-31 ENCOUNTER — Other Ambulatory Visit: Payer: Self-pay | Admitting: Physician Assistant

## 2014-03-31 ENCOUNTER — Ambulatory Visit
Admission: RE | Admit: 2014-03-31 | Discharge: 2014-03-31 | Disposition: A | Discharge status: Home / Self Care | Payer: Medicare Other | Source: Ambulatory Visit | Attending: Physician Assistant | Admitting: Physician Assistant

## 2014-03-31 DIAGNOSIS — M545 Low back pain, unspecified: Secondary | ICD-10-CM

## 2014-04-04 ENCOUNTER — Other Ambulatory Visit: Payer: Self-pay | Admitting: Family Medicine

## 2014-04-04 DIAGNOSIS — M545 Low back pain, unspecified: Secondary | ICD-10-CM

## 2014-04-06 ENCOUNTER — Ambulatory Visit
Admission: RE | Admit: 2014-04-06 | Discharge: 2014-04-06 | Disposition: A | Discharge status: Home / Self Care | Payer: Medicare Other | Source: Ambulatory Visit | Attending: Family Medicine | Admitting: Family Medicine

## 2014-04-06 DIAGNOSIS — M545 Low back pain, unspecified: Secondary | ICD-10-CM

## 2014-04-06 MED ORDER — GADOBENATE DIMEGLUMINE 529 MG/ML IV SOLN
13.0000 mL | Freq: Once | INTRAVENOUS | Status: AC | PRN
  Administered 2014-04-06: 13 mL via INTRAVENOUS

## 2014-05-08 ENCOUNTER — Encounter (HOSPITAL_COMMUNITY): Payer: Self-pay | Admitting: Emergency Medicine

## 2014-05-08 ENCOUNTER — Inpatient Hospital Stay (HOSPITAL_COMMUNITY)
Admission: EM | Admit: 2014-05-08 | Discharge: 2014-05-10 | DRG: 918 | Disposition: A | Discharge status: Other Acute Inpatient Hospital | Payer: Medicare Other | Attending: Internal Medicine | Admitting: Internal Medicine

## 2014-05-08 DIAGNOSIS — E785 Hyperlipidemia, unspecified: Secondary | ICD-10-CM | POA: Diagnosis present

## 2014-05-08 DIAGNOSIS — M545 Low back pain, unspecified: Secondary | ICD-10-CM | POA: Diagnosis present

## 2014-05-08 DIAGNOSIS — F17200 Nicotine dependence, unspecified, uncomplicated: Secondary | ICD-10-CM | POA: Diagnosis present

## 2014-05-08 DIAGNOSIS — Z91040 Latex allergy status: Secondary | ICD-10-CM

## 2014-05-08 DIAGNOSIS — Z801 Family history of malignant neoplasm of trachea, bronchus and lung: Secondary | ICD-10-CM

## 2014-05-08 DIAGNOSIS — I10 Essential (primary) hypertension: Secondary | ICD-10-CM

## 2014-05-08 DIAGNOSIS — N390 Urinary tract infection, site not specified: Secondary | ICD-10-CM | POA: Diagnosis present

## 2014-05-08 DIAGNOSIS — D72829 Elevated white blood cell count, unspecified: Secondary | ICD-10-CM

## 2014-05-08 DIAGNOSIS — Z825 Family history of asthma and other chronic lower respiratory diseases: Secondary | ICD-10-CM

## 2014-05-08 DIAGNOSIS — T50912A Poisoning by multiple unspecified drugs, medicaments and biological substances, intentional self-harm, initial encounter: Secondary | ICD-10-CM

## 2014-05-08 DIAGNOSIS — Z823 Family history of stroke: Secondary | ICD-10-CM

## 2014-05-08 DIAGNOSIS — E1159 Type 2 diabetes mellitus with other circulatory complications: Secondary | ICD-10-CM | POA: Diagnosis present

## 2014-05-08 DIAGNOSIS — Z833 Family history of diabetes mellitus: Secondary | ICD-10-CM

## 2014-05-08 DIAGNOSIS — R45851 Suicidal ideations: Secondary | ICD-10-CM

## 2014-05-08 DIAGNOSIS — I959 Hypotension, unspecified: Secondary | ICD-10-CM | POA: Diagnosis present

## 2014-05-08 DIAGNOSIS — T50902A Poisoning by unspecified drugs, medicaments and biological substances, intentional self-harm, initial encounter: Secondary | ICD-10-CM

## 2014-05-08 DIAGNOSIS — Z880 Allergy status to penicillin: Secondary | ICD-10-CM

## 2014-05-08 DIAGNOSIS — Z882 Allergy status to sulfonamides status: Secondary | ICD-10-CM

## 2014-05-08 DIAGNOSIS — F332 Major depressive disorder, recurrent severe without psychotic features: Secondary | ICD-10-CM | POA: Diagnosis present

## 2014-05-08 DIAGNOSIS — Z609 Problem related to social environment, unspecified: Secondary | ICD-10-CM

## 2014-05-08 DIAGNOSIS — F322 Major depressive disorder, single episode, severe without psychotic features: Secondary | ICD-10-CM

## 2014-05-08 DIAGNOSIS — T40601A Poisoning by unspecified narcotics, accidental (unintentional), initial encounter: Principal | ICD-10-CM | POA: Diagnosis present

## 2014-05-08 DIAGNOSIS — Z836 Family history of other diseases of the respiratory system: Secondary | ICD-10-CM

## 2014-05-08 DIAGNOSIS — Z9089 Acquired absence of other organs: Secondary | ICD-10-CM

## 2014-05-08 DIAGNOSIS — I152 Hypertension secondary to endocrine disorders: Secondary | ICD-10-CM | POA: Diagnosis present

## 2014-05-08 DIAGNOSIS — G894 Chronic pain syndrome: Secondary | ICD-10-CM | POA: Diagnosis present

## 2014-05-08 DIAGNOSIS — Z720 Tobacco use: Secondary | ICD-10-CM | POA: Diagnosis present

## 2014-05-08 DIAGNOSIS — Z888 Allergy status to other drugs, medicaments and biological substances status: Secondary | ICD-10-CM

## 2014-05-08 DIAGNOSIS — Z885 Allergy status to narcotic agent status: Secondary | ICD-10-CM

## 2014-05-08 HISTORY — DX: Dorsalgia, unspecified: M54.9

## 2014-05-08 LAB — CBC WITH DIFFERENTIAL/PLATELET
BASOS ABS: 0 10*3/uL (ref 0.0–0.1)
Basophils Relative: 0 % (ref 0–1)
EOS ABS: 0.1 10*3/uL (ref 0.0–0.7)
Eosinophils Relative: 0 % (ref 0–5)
HEMATOCRIT: 41.5 % (ref 36.0–46.0)
Hemoglobin: 13.7 g/dL (ref 12.0–15.0)
Lymphocytes Relative: 7 % — ABNORMAL LOW (ref 12–46)
Lymphs Abs: 1.2 10*3/uL (ref 0.7–4.0)
MCH: 27.8 pg (ref 26.0–34.0)
MCHC: 33 g/dL (ref 30.0–36.0)
MCV: 84.3 fL (ref 78.0–100.0)
MONO ABS: 1.5 10*3/uL — AB (ref 0.1–1.0)
MONOS PCT: 9 % (ref 3–12)
Neutro Abs: 14.4 10*3/uL — ABNORMAL HIGH (ref 1.7–7.7)
Neutrophils Relative %: 84 % — ABNORMAL HIGH (ref 43–77)
Platelets: 280 10*3/uL (ref 150–400)
RBC: 4.92 MIL/uL (ref 3.87–5.11)
RDW: 13.7 % (ref 11.5–15.5)
WBC: 17.2 10*3/uL — ABNORMAL HIGH (ref 4.0–10.5)

## 2014-05-08 MED ORDER — SODIUM CHLORIDE 0.9 % IV BOLUS (SEPSIS)
500.0000 mL | Freq: Once | INTRAVENOUS | Status: AC
  Administered 2014-05-08: 500 mL via INTRAVENOUS

## 2014-05-08 MED ORDER — NALOXONE HCL 1 MG/ML IJ SOLN
1.0000 mg | Freq: Once | INTRAMUSCULAR | Status: AC
  Administered 2014-05-08: 1 mg via INTRAVENOUS
  Filled 2014-05-08: qty 2

## 2014-05-08 NOTE — ED Notes (Signed)
Dr Yelverton at bedside.  

## 2014-05-08 NOTE — ED Notes (Addendum)
Pt presents from home via [facility] with c/o intentional overdose and suicidal ideation/attempt. Pt Took 32-15mg Oxycodone, 8-(unknown dose) Hydrocodone, 10-50mg  Trazodone, and 1/2 bottle of liquid Benadryl over the course of last night and into today. Pt reports she was prescribed Oxycodone for back pain. Pt has a history of SI with muscle relaxer medications.  Pt states "I just felt so useless."  Per EMS pt was not oriented upon their arrival, pt was given 1mg  Narcan PTA and pt is now A&Ox4

## 2014-05-08 NOTE — ED Provider Notes (Signed)
CSN: [id number]     Arrival date & time 05/08/14  2216 History   First MD Initiated Contact with Patient 05/08/14 2304     Chief Complaint  Patient presents with  . Drug Overdose  . Suicide Attempt     (Consider location/radiation/quality/duration/timing/severity/associated sxs/prior Treatment) HPI Patient presents via EMS after intentional overdose with 32 (50 mg) oxycodone, 8 hydrocodone, 10 (50 mg) trazodone and half a bottle of liquid Benadryl per family. Patient is unable to remember exactly what she took. Patient is being treated with increased dose of pain medication for back pain after fracture. Family found patient confused this evening and called EMS. After reviewing her medications, found the patient was short of her medications. Patient admits to taking increased dosage an attempt to harm herself in the last 24 hours. She is found to be confused and hypertensive in route by EMS. Was given 1 mg of Narcan with improvement of her blood pressure and her mental status. Currently she is confused and oriented only to self. She is unable to contribute history. Family is at bedside. Past Medical History  Diagnosis Date  . Hyperlipemia   . Neuropathic pain of chest   . Hypertension   . Back pain    Past Surgical History  Procedure Laterality Date  . Abdominal hysterectomy    . Bladder repair    . Wisdom tooth extraction    . Tonsillectomy    . Appendectomy    . Salpingoorrphorectomy Right    Family History  Problem Relation Age of Onset  . Stroke Mother   . Diabetes Mother   . Diabetes Father   . Emphysema Father   . Cancer Father     Lung  . Asthma Father   . Diabetes Sister   . Diabetes Brother    History  Substance Use Topics  . Smoking status: Current Every Day Smoker -- 1.33 packs/day for 30 years    Types: Cigarettes  . Smokeless tobacco: Never Used  . Alcohol Use: No   OB History   Grav Para Term Preterm Abortions TAB SAB Ect Mult Living                  Review of Systems  Unable to perform ROS: Mental status change      Allergies  Latex; Lyrica; Morphine and related; Neurontin; Penicillins; Sulfa drugs cross reactors; and Xanax  Home Medications   Prior to Admission medications   Medication Sig Start Date End Date Taking? Authorizing Provider  diazepam (VALIUM) 2 MG tablet Take 1 mg by mouth every 6 (six) hours as needed for anxiety.    Historical Provider, MD  dicyclomine (BENTYL) 20 MG tablet Take 1 tablet (20 mg total) by mouth 4 (four) times daily -  before meals and at bedtime. 03/24/14   Richarda Blade, MD  losartan (COZAAR) 25 MG tablet Take 25 mg by mouth every morning.  01/20/13   Historical Provider, MD  OxyCODONE HCl 7.5 MG TABA Take 1 tablet by mouth every other day as needed.    Historical Provider, MD   BP 98/60  Pulse 81  Temp(Src) 98.6 F (37 C) (Oral)  Resp 12  SpO2 96% Physical Exam  Nursing note and vitals reviewed. Constitutional: She appears well-developed and well-nourished. No distress.  HENT:  Head: Normocephalic and atraumatic.  Mouth/Throat: Oropharynx is clear and moist. No oropharyngeal exudate.  Eyes: EOM are normal. Pupils are equal, round, and reactive to light.  Neck: Normal range of  motion. Neck supple.  Cardiovascular: Normal rate and regular rhythm.   Pulmonary/Chest: Effort normal and breath sounds normal. No respiratory distress. She has no wheezes. She has no rales. She exhibits no tenderness.  Abdominal: Soft. Bowel sounds are normal. She exhibits no distension and no mass. There is no tenderness. There is no rebound and no guarding.  Musculoskeletal: Normal range of motion. She exhibits no edema and no tenderness.  Neurological: She is alert.  Oriented only to person. Incomprehensible speech at times. 5/5 motor in all extremities. Sensation is intact.  Skin: Skin is warm and dry. No rash noted. No erythema.  Psychiatric: She has a normal mood and affect. Her behavior is normal.     ED Course  Procedures (including critical care time) Labs Review Labs Reviewed  CBC WITH DIFFERENTIAL - Abnormal; Notable for the following:    WBC 17.2 (*)    Neutrophils Relative % 84 (*)    Neutro Abs 14.4 (*)    Lymphocytes Relative 7 (*)    Monocytes Absolute 1.5 (*)    All other components within normal limits  COMPREHENSIVE METABOLIC PANEL  ETHANOL  ACETAMINOPHEN LEVEL  SALICYLATE LEVEL  URINE RAPID DRUG SCREEN (HOSP PERFORMED)  URINALYSIS, ROUTINE W REFLEX MICROSCOPIC    Imaging Review No results found.   EKG Interpretation   Date/Time:  Monday May 08 2014 22:43:58 EDT Ventricular Rate:  85 PR Interval:  178 QRS Duration: 83 QT Interval:  350 QTC Calculation: 416 R Axis:   9 Text Interpretation:  Sinus arrhythmia Confirmed by Lita Mains  MD, Yareni Creps  ([id number]) on 05/09/2014 1:28:08 AM      MDM   Final diagnoses:  None   Discussed with poison control. Observation and Narcan as needed.  Patient's mental status improved with Narcan in emergency department. Discuss with hospitalist who will admit to a step down bed and observe.     Julianne Rice, MD 05/09/14 [phone]

## 2014-05-09 ENCOUNTER — Inpatient Hospital Stay (HOSPITAL_COMMUNITY): Payer: Medicare Other

## 2014-05-09 ENCOUNTER — Encounter (HOSPITAL_COMMUNITY): Payer: Self-pay | Admitting: Internal Medicine

## 2014-05-09 DIAGNOSIS — F17200 Nicotine dependence, unspecified, uncomplicated: Secondary | ICD-10-CM

## 2014-05-09 DIAGNOSIS — I10 Essential (primary) hypertension: Secondary | ICD-10-CM

## 2014-05-09 DIAGNOSIS — E1159 Type 2 diabetes mellitus with other circulatory complications: Secondary | ICD-10-CM | POA: Diagnosis present

## 2014-05-09 DIAGNOSIS — M545 Low back pain, unspecified: Secondary | ICD-10-CM | POA: Diagnosis present

## 2014-05-09 DIAGNOSIS — Z720 Tobacco use: Secondary | ICD-10-CM | POA: Diagnosis present

## 2014-05-09 DIAGNOSIS — T50912A Poisoning by multiple unspecified drugs, medicaments and biological substances, intentional self-harm, initial encounter: Secondary | ICD-10-CM

## 2014-05-09 DIAGNOSIS — T50902A Poisoning by unspecified drugs, medicaments and biological substances, intentional self-harm, initial encounter: Secondary | ICD-10-CM

## 2014-05-09 DIAGNOSIS — T50901A Poisoning by unspecified drugs, medicaments and biological substances, accidental (unintentional), initial encounter: Secondary | ICD-10-CM

## 2014-05-09 DIAGNOSIS — R45851 Suicidal ideations: Secondary | ICD-10-CM

## 2014-05-09 DIAGNOSIS — D72829 Elevated white blood cell count, unspecified: Secondary | ICD-10-CM | POA: Diagnosis present

## 2014-05-09 DIAGNOSIS — G894 Chronic pain syndrome: Secondary | ICD-10-CM | POA: Diagnosis present

## 2014-05-09 DIAGNOSIS — I152 Hypertension secondary to endocrine disorders: Secondary | ICD-10-CM | POA: Diagnosis present

## 2014-05-09 DIAGNOSIS — F332 Major depressive disorder, recurrent severe without psychotic features: Secondary | ICD-10-CM

## 2014-05-09 LAB — CBC WITH DIFFERENTIAL/PLATELET
BASOS ABS: 0 10*3/uL (ref 0.0–0.1)
Basophils Relative: 0 % (ref 0–1)
EOS PCT: 0 % (ref 0–5)
Eosinophils Absolute: 0 10*3/uL (ref 0.0–0.7)
HCT: 40.1 % (ref 36.0–46.0)
Hemoglobin: 13 g/dL (ref 12.0–15.0)
LYMPHS ABS: 1.8 10*3/uL (ref 0.7–4.0)
LYMPHS PCT: 15 % (ref 12–46)
MCH: 27.2 pg (ref 26.0–34.0)
MCHC: 32.4 g/dL (ref 30.0–36.0)
MCV: 83.9 fL (ref 78.0–100.0)
Monocytes Absolute: 0.7 10*3/uL (ref 0.1–1.0)
Monocytes Relative: 6 % (ref 3–12)
NEUTROS ABS: 9.7 10*3/uL — AB (ref 1.7–7.7)
Neutrophils Relative %: 79 % — ABNORMAL HIGH (ref 43–77)
PLATELETS: 278 10*3/uL (ref 150–400)
RBC: 4.78 MIL/uL (ref 3.87–5.11)
RDW: 14 % (ref 11.5–15.5)
WBC: 12.2 10*3/uL — AB (ref 4.0–10.5)

## 2014-05-09 LAB — URINE MICROSCOPIC-ADD ON

## 2014-05-09 LAB — URINALYSIS, ROUTINE W REFLEX MICROSCOPIC
BILIRUBIN URINE: NEGATIVE
Glucose, UA: NEGATIVE mg/dL
HGB URINE DIPSTICK: NEGATIVE
Ketones, ur: NEGATIVE mg/dL
NITRITE: NEGATIVE
PH: 5.5 (ref 5.0–8.0)
Protein, ur: NEGATIVE mg/dL
SPECIFIC GRAVITY, URINE: 1.018 (ref 1.005–1.030)
Urobilinogen, UA: 0.2 mg/dL (ref 0.0–1.0)

## 2014-05-09 LAB — RAPID URINE DRUG SCREEN, HOSP PERFORMED
Amphetamines: NOT DETECTED
BARBITURATES: NOT DETECTED
Benzodiazepines: POSITIVE — AB
COCAINE: NOT DETECTED
OPIATES: POSITIVE — AB
TETRAHYDROCANNABINOL: NOT DETECTED

## 2014-05-09 LAB — COMPREHENSIVE METABOLIC PANEL
ALBUMIN: 3.5 g/dL (ref 3.5–5.2)
ALT: 24 U/L (ref 0–35)
ANION GAP: 16 — AB (ref 5–15)
AST: 38 U/L — AB (ref 0–37)
Alkaline Phosphatase: 94 U/L (ref 39–117)
BUN: 18 mg/dL (ref 6–23)
CALCIUM: 8.9 mg/dL (ref 8.4–10.5)
CO2: 23 mEq/L (ref 19–32)
CREATININE: 1.34 mg/dL — AB (ref 0.50–1.10)
Chloride: 97 mEq/L (ref 96–112)
GFR calc Af Amer: 46 mL/min — ABNORMAL LOW (ref >90)
GFR calc non Af Amer: 40 mL/min — ABNORMAL LOW (ref >90)
Glucose, Bld: 168 mg/dL — ABNORMAL HIGH (ref 70–99)
Potassium: 3.8 mEq/L (ref 3.7–5.3)
Sodium: 136 mEq/L — ABNORMAL LOW (ref 137–147)
TOTAL PROTEIN: 6.3 g/dL (ref 6.0–8.3)
Total Bilirubin: <0.2 mg/dL — ABNORMAL LOW (ref 0.3–1.2)

## 2014-05-09 LAB — HEPATIC FUNCTION PANEL
ALT: 23 U/L (ref 0–35)
AST: 35 U/L (ref 0–37)
Albumin: 3.7 g/dL (ref 3.5–5.2)
Alkaline Phosphatase: 92 U/L (ref 39–117)
Bilirubin, Direct: <0.2 mg/dL (ref 0.0–0.3)
TOTAL PROTEIN: 6.5 g/dL (ref 6.0–8.3)
Total Bilirubin: <0.2 mg/dL — ABNORMAL LOW (ref 0.3–1.2)

## 2014-05-09 LAB — BASIC METABOLIC PANEL
ANION GAP: 15 (ref 5–15)
BUN: 14 mg/dL (ref 6–23)
CO2: 22 meq/L (ref 19–32)
Calcium: 8.9 mg/dL (ref 8.4–10.5)
Chloride: 102 mEq/L (ref 96–112)
Creatinine, Ser: 1.1 mg/dL (ref 0.50–1.10)
GFR calc Af Amer: 58 mL/min — ABNORMAL LOW (ref >90)
GFR calc non Af Amer: 50 mL/min — ABNORMAL LOW (ref >90)
Glucose, Bld: 141 mg/dL — ABNORMAL HIGH (ref 70–99)
POTASSIUM: 4 meq/L (ref 3.7–5.3)
SODIUM: 139 meq/L (ref 137–147)

## 2014-05-09 LAB — SALICYLATE LEVEL
Salicylate Lvl: <2 mg/dL — ABNORMAL LOW (ref 2.8–20.0)
Salicylate Lvl: <2 mg/dL — ABNORMAL LOW (ref 2.8–20.0)

## 2014-05-09 LAB — ACETAMINOPHEN LEVEL: ACETAMINOPHEN (TYLENOL), SERUM: 23.9 ug/mL (ref 10–30)

## 2014-05-09 LAB — MRSA PCR SCREENING: MRSA by PCR: NEGATIVE

## 2014-05-09 LAB — ETHANOL: Alcohol, Ethyl (B): <11 mg/dL (ref 0–11)

## 2014-05-09 MED ORDER — ENOXAPARIN SODIUM 40 MG/0.4ML ~~LOC~~ SOLN
40.0000 mg | SUBCUTANEOUS | Status: DC
  Administered 2014-05-09: 40 mg via SUBCUTANEOUS
  Filled 2014-05-09 (×2): qty 0.4

## 2014-05-09 MED ORDER — HYDRALAZINE HCL 20 MG/ML IJ SOLN: 10.0000 mg | INTRAMUSCULAR | Status: DC | PRN

## 2014-05-09 MED ORDER — ONDANSETRON HCL 4 MG PO TABS: 4.0000 mg | ORAL_TABLET | Freq: Four times a day (QID) | ORAL | Status: DC | PRN

## 2014-05-09 MED ORDER — ACETAMINOPHEN 325 MG PO TABS: 650.0000 mg | ORAL_TABLET | Freq: Four times a day (QID) | ORAL | Status: DC | PRN

## 2014-05-09 MED ORDER — SODIUM CHLORIDE 0.9 % IV SOLN: INTRAVENOUS | Status: DC

## 2014-05-09 MED ORDER — NICOTINE 21 MG/24HR TD PT24
21.0000 mg | MEDICATED_PATCH | Freq: Every day | TRANSDERMAL | Status: DC
  Administered 2014-05-09 – 2014-05-10 (×2): 21 mg via TRANSDERMAL
  Filled 2014-05-09 (×3): qty 1

## 2014-05-09 MED ORDER — ONDANSETRON HCL 4 MG/2ML IJ SOLN: 4.0000 mg | Freq: Four times a day (QID) | INTRAMUSCULAR | Status: DC | PRN

## 2014-05-09 MED ORDER — SODIUM CHLORIDE 0.9 % IV SOLN
INTRAVENOUS | Status: DC
  Administered 2014-05-09: 07:00:00 via INTRAVENOUS

## 2014-05-09 MED ORDER — ACETAMINOPHEN 650 MG RE SUPP: 650.0000 mg | Freq: Four times a day (QID) | RECTAL | Status: DC | PRN

## 2014-05-09 NOTE — Progress Notes (Signed)
[place] TEAM 1 - Stepdown/ICU TEAM Progress Note  ADDILYNN MOWRER YTK:[id number] DOB: April 04, 1946 DOA: 05/08/2014 PCP: Shirline Frees, MD  Admit HPI / Brief Narrative: KEYARIA LAWSON is a 68 y.o. WF PMHx  chronic pain syndrome from vertebral fracture, hypertension was brought to the ER after patient's family found that patient was getting increasingly confused and restless last night. Patient's family was trying to reach patient threw herself on when she was not responding and daughter who stays next door dropped in and patient opened the toe but was found to be confused and restless. Patient was talking out of her head. As per patient's daughter patient has been telling them that her pain is becoming unbearable and she was stating "it's not worth living"last few days. When daughter checked the patient's pill box 32 tablets of oxycodone 15 mg strength and trazodone and Benadryl liquid form which was purchased yesterday was missing. Patient called EMS and patient was brought to the ER. En route patient was given 1 mg of narcan and another one more dose was given in the ER as patient was mildly lethargic but not unresponsive. On my exam patient is alert and awake but is not able to think chronologically. Patient admits to have taken some of her medications with the intention of suicide. Patient states that she's been having increasing back pain and has become increasingly unbearable. Patient's daughter states she has been diagnosed with T8 fracture in February and T11 fracture 6 weeks ago. Patient otherwise denies any palpitations chest pain headache shortness of breath nausea vomiting visual symptoms or any weakness of the upper or lower extremities abdominal pain diarrhea fever chills.    HPI/Subjective: 7/7 A/O x4, States unsure she was trying to commit suicide but understood taking all the meds was not good for her. States Dr Mickel Crow [facility]) is PCP and controls her pain meds.     Assessment/Plan: Suicide attempt -Psychiatry consulted -Continue suicide precautions  HTN -Slightly elevated above AHA guidelines but would not try to lower this point monitor closely  Chronic pain syndrome -Hold all pain medication/psychotropic medication until psychiatry has evaluated patient for her suicide attempt  Tobacco abuse -38.9 pack year smoking history -Nicotine patch    Code Status: FULL Family Communication: no family present at time of exam Disposition Plan: per Psychiatry    Consultants: Psychiatry Consult pending    Procedure/Significant Events:    Culture NA  Antibiotics: NA  DVT prophylaxis: Lovenox   Devices NA   LINES / TUBES:  7/6 18 ga Rt Antecubital    Continuous Infusions: . sodium chloride 100 mL/hr at 05/09/14 0649    Objective: VITAL SIGNS: Temp: 98.4 F (36.9 C) (07/07 0821) Temp src: Oral (07/07 0821) BP: 123/56 mmHg (07/07 0821) Pulse Rate: 66 (07/07 0821) SPO2; 98% on room air FIO2:   Intake/Output Summary (Last 24 hours) at 05/09/14 1001 Last data filed at 05/09/14 0900  Gross per 24 hour  Intake   1495 ml  Output   1000 ml  Net    495 ml     Exam: General: A./O. x4, NAD No acute respiratory distress, mildly confused poor understanding of why she is hospitalized. Lungs: Clear to auscultation bilaterally without wheezes or crackles Cardiovascular: Regular rate and rhythm without murmur gallop or rub normal S1 and S2 Abdomen: Nontender, nondistended, soft, bowel sounds positive, no rebound, no ascites, no appreciable mass Extremities: No significant cyanosis, clubbing, or edema bilateral lower extremities  Data Reviewed: Basic Metabolic Panel:  Recent Labs Lab 05/08/14 2322 05/09/14 0451  NA 136* 139  K 3.8 4.0  CL 97 102  CO2 23 22  GLUCOSE 168* 141*  BUN 18 14  CREATININE 1.34* 1.10  CALCIUM 8.9 8.9   Liver Function Tests:  Recent Labs Lab 05/08/14 2322 05/09/14 0451  AST 38* 35   ALT 24 23  ALKPHOS 94 92  BILITOT <0.2* <0.2*  PROT 6.3 6.5  ALBUMIN 3.5 3.7   No results found for this basename: LIPASE, AMYLASE,  in the last 168 hours No results found for this basename: AMMONIA,  in the last 168 hours CBC:  Recent Labs Lab 05/08/14 2322 05/09/14 0451  WBC 17.2* 12.2*  NEUTROABS 14.4* 9.7*  HGB 13.7 13.0  HCT 41.5 40.1  MCV 84.3 83.9  PLT 280 278   Cardiac Enzymes: No results found for this basename: CKTOTAL, CKMB, CKMBINDEX, TROPONINI,  in the last 168 hours BNP (last 3 results) No results found for this basename: PROBNP,  in the last 8760 hours CBG: No results found for this basename: GLUCAP,  in the last 168 hours  Recent Results (from the past 240 hour(s))  MRSA PCR SCREENING     Status: None   Collection Time    05/09/14  7:34 AM      Result Value Ref Range Status   MRSA by PCR NEGATIVE  NEGATIVE Final   Comment:            The GeneXpert MRSA Assay (FDA     approved for NASAL specimens     only), is one component of a     comprehensive MRSA colonization     surveillance program. It is not     intended to diagnose MRSA     infection nor to guide or     monitor treatment for     MRSA infections.     Studies:  Recent x-ray studies have been reviewed in detail by the Attending Physician  Scheduled Meds:  Scheduled Meds: . enoxaparin (LOVENOX) injection  40 mg Subcutaneous Q24H  . nicotine  21 mg Transdermal Daily    Time spent on care of this patient: 40 mins   Allie Bossier , MD   [facility] Office  [phone] Pager - [phone]  On-Call/Text Page:      [email or web address]      password [email or web address]  If 7PM-7AM, please contact night-coverage [email or web address] Password [email or web address] 05/09/2014, 10:01 AM   LOS: 1 day

## 2014-05-09 NOTE — H&P (Addendum)
[facility] History and Physical  Alexis Mclean RXV:[id number] DOB: Nov 06, 1945 DOA: 05/08/2014  Referring physician: ER physician. PCP: Alexis Frees, MD   History obtained from patient's daughter, ER physician and patient.  Chief Complaint: Confusion.  HPI: Alexis Mclean is a 68 y.o. female with history of chronic pain from vertebral fracture, hypertension was brought to the ER after patient's family found that patient was getting increasingly confused and restless last night. Patient's family was trying to reach patient threw herself on when she was not responding and daughter who stays next door dropped in and patient opened the toe but was found to be confused and restless. Patient was talking out of her head. As per patient's daughter patient has been telling them that her pain is becoming unbearable and she was stating "it's not worth living"last few days. When daughter checked the patient's pill box 32 tablets of oxycodone 15 mg strength and trazodone and Benadryl liquid form which was purchased yesterday was missing. Patient called EMS and patient was brought to the ER. En route patient was given 1 mg of narcan and another one more dose was given in the ER as patient was mildly lethargic but not unresponsive. On my exam patient is alert and awake but is not able to think chronologically. Patient admits to have taken some of her medications with the intention of suicide. Patient states that she's been having increasing back pain and has become increasingly unbearable. Patient's daughter states she has been diagnosed with T8 fracture in February and T11 fracture 6 weeks ago. Patient otherwise denies any palpitations chest pain headache shortness of breath nausea vomiting visual symptoms or any weakness of the upper or lower extremities abdominal pain diarrhea fever chills.   Review of Systems: As presented in the history of presenting illness, rest negative.  Past Medical History   Diagnosis Date  . Hyperlipemia   . Neuropathic pain of chest   . Hypertension   . Back pain    Past Surgical History  Procedure Laterality Date  . Abdominal hysterectomy    . Bladder repair    . Wisdom tooth extraction    . Tonsillectomy    . Appendectomy    . Salpingoorrphorectomy Right    Social History:  reports that she has been smoking Cigarettes.  She has a 39.9 pack-year smoking history. She has never used smokeless tobacco. She reports that she does not drink alcohol or use illicit drugs. Where does patient live home. Can patient participate in ADLs? Yes.  Allergies  Allergen Reactions  . Morphine And Related Other (See Comments)    hypotension  . Latex Rash  . Neurontin [Gabapentin] Other (See Comments)    dizziness  . Penicillins Diarrhea and Other (See Comments)    Cramping in abdomen  . Sulfa Drugs Cross Reactors Nausea Only    Really sick feeling  . Xanax [Alprazolam] Other (See Comments)    insomnia  . Lyrica [Pregabalin] Other (See Comments)    unknown    Family History:  Family History  Problem Relation Age of Onset  . Stroke Mother   . Diabetes Mother   . Diabetes Father   . Emphysema Father   . Cancer Father     Lung  . Asthma Father   . Diabetes Sister   . Diabetes Brother       Prior to Admission medications   Not on File    Physical Exam: Filed Vitals:   05/09/14 0030 05/09/14 0045 05/09/14  0100 05/09/14 0236  BP: 97/62 106/59 107/61   Pulse: 71 72 72   Temp:    98.4 F (36.9 C)  TempSrc:    Oral  Resp: 12 18 18    SpO2: 98% 97% 98%      General:  Well developed and moderately nourished.  Eyes: Anicteric no pallor. Pupils are equally reacting to light but pinpoint.  ENT: No discharge from the ears eyes nose mouth.  Neck: No neck rigidity or mass felt.  Cardiovascular: S1-S2 heard.  Respiratory: No rhonchi or crepitations.  Abdomen: Soft nontender bowel sounds present. No guarding or rigidity.  Skin: No  rash.  Musculoskeletal: No edema.  Psychiatric: Patient is suicidal.  Neurologic: Alert awake oriented to name and place. Moves all extremities 5 x 5.  Labs on Admission:  Basic Metabolic Panel:  Recent Labs Lab 05/08/14 2322  NA 136*  K 3.8  CL 97  CO2 23  GLUCOSE 168*  BUN 18  CREATININE 1.34*  CALCIUM 8.9   Liver Function Tests:  Recent Labs Lab 05/08/14 2322  AST 38*  ALT 24  ALKPHOS 94  BILITOT <0.2*  PROT 6.3  ALBUMIN 3.5   No results found for this basename: LIPASE, AMYLASE,  in the last 168 hours No results found for this basename: AMMONIA,  in the last 168 hours CBC:  Recent Labs Lab 05/08/14 2322  WBC 17.2*  NEUTROABS 14.4*  HGB 13.7  HCT 41.5  MCV 84.3  PLT 280   Cardiac Enzymes: No results found for this basename: CKTOTAL, CKMB, CKMBINDEX, TROPONINI,  in the last 168 hours  BNP (last 3 results) No results found for this basename: PROBNP,  in the last 8760 hours CBG: No results found for this basename: GLUCAP,  in the last 168 hours  Radiological Exams on Admission: No results found.  EKG: Independently reviewed. Normal sinus rhythm with QTC of 416 ms and QRS of 83 ms  Assessment/Plan Principal Problem:   Intentional drug overdose Active Problems:   Suicidal ideation   HTN (hypertension)   Low back pain   Leucocytosis   1. Intentional drug overdose with suicidal ideation and depression - patient at this time has been placed on suicide precautions and sitter. I'm holding off patient's pain relief medications and as per the daughter patient gets reactions to benzodiazepines that patient becomes very psychotic. At this time we will closely observe for any arrhythmias and any further mental status changes or any seizures. Continue with hydration. I will discuss with poison control. Patient likely has taken extra doses of oxycodone Benadryl and trazodone. 2. Hypertension - patient was initially mildly hypotensive on presentation. We will  hold off antihypertensives for now. Patient will be on when necessary IV hydralazine for systolic blood pressure more than 160. 3. History of chronic back pain - if patient's pain recurs may have to place patient back on when necessary morphine. Depends on patient's respiratory status and mental status.  Consult Psychiatry in AM.  Code Status: Full code.  Family Communication: Patient's daughter through the phone.  Disposition Plan: Admit to inpatient.    Jamine Wingate N. [facility] Pager [phone].  If 7PM-7AM, please contact night-coverage [email or web address] Password [facility] 05/09/2014, 2:38 AM

## 2014-05-09 NOTE — Consult Note (Signed)
[facility] Psychiatry Consult   Reason for Consult:  Suicidal attempt Referring Physician:  Allie Bossier, MD  Alexis Mclean is an 68 y.o. female. Total Time spent with patient: 45 minutes  Assessment: AXIS I:  Major Depression, Recurrent severe AXIS II:  Deferred AXIS III:   Past Medical History  Diagnosis Date  . Hyperlipemia   . Neuropathic pain of chest   . Hypertension   . Back pain    AXIS IV:  other psychosocial or environmental problems, problems related to social environment, problems with access to health care services and problems with primary support group AXIS V:  41-50 serious symptoms  Plan:  Will discuss with Allie Bossier, MD and seen patient with Sindy Messing, LCSW.  Patient needs geriatric psychiatric hospitalization Recommend psychiatric Inpatient admission when medically cleared. Supportive therapy provided about ongoing stressors. Refer to Geriatric psychiatry and will contact social service for appropriate psych bed. Appreciate psychiatric consultation and follow up as clinically required Please contact [phone] if needs further assistance  Subjective:   Alexis Mclean is a 68 y.o. female patient admitted with suicidal attmpt.  HPI: Patient is seen, chart reviewed and face to face psych evaluation for depression and suicidal attempt with multiple medications to end her life. Patient stated that she is suffering with severe back pain and her treatment has limited benefits. Patient has been depressed, isolated, withdrawn, hopeless and helpless and cannot contract for safety.  Alexis Mclean is a 68 y.o. female with history of chronic pain from vertebral fracture, hypertension was brought to the ER after patient's family found that patient was getting increasingly confused and restless last night. Patient's family was trying to reach patient threw herself on when she was not responding and daughter who stays next door dropped in and patient  opened the toe but was found to be confused and restless. Patient was talking out of her head. As per patient's daughter patient has been telling them that her pain is becoming unbearable and she was stating "it's not worth living"last few days. When daughter checked the patient's pill box 32 tablets of oxycodone 15 mg strength and trazodone and Benadryl liquid form which was purchased yesterday was missing.   Patient called EMS and patient was brought to the ER. En route patient was given 1 mg of narcan and another one more dose was given in the ER as patient was mildly lethargic but not unresponsive. On my exam patient is alert and awake but is not able to think chronologically. Patient admits to have taken some of her medications with the intention of suicide. Patient states that she's been having increasing back pain and has become increasingly unbearable. Patient's daughter states she has been diagnosed with T8 fracture in February and T11 fracture 6 weeks ago. Patient otherwise denies any palpitations chest pain headache shortness of breath nausea vomiting visual symptoms or any weakness of the upper or lower extremities abdominal pain diarrhea fever chills. UDS is positive for benzo's and Opioids.  Review of Systems: As presented in the history of presenting illness, rest negative.  HPI Elements:   Location:  depression . Quality:  poor. Severity:  acute. Timing:  chronic back pain.  Past Psychiatric History: Past Medical History  Diagnosis Date  . Hyperlipemia   . Neuropathic pain of chest   . Hypertension   . Back pain     reports that she has been smoking Cigarettes.  She has a 39.9 pack-year smoking history.  She has never used smokeless tobacco. She reports that she does not drink alcohol or use illicit drugs. Family History  Problem Relation Age of Onset  . Stroke Mother   . Diabetes Mother   . Diabetes Father   . Emphysema Father   . Cancer Father     Lung  . Asthma Father    . Diabetes Sister   . Diabetes Brother       Allergies:   Allergies  Allergen Reactions  . Morphine And Related Other (See Comments)    hypotension  . Latex Rash  . Neurontin [Gabapentin] Other (See Comments)    dizziness  . Penicillins Diarrhea and Other (See Comments)    Cramping in abdomen  . Sulfa Drugs Cross Reactors Nausea Only    Really sick feeling  . Xanax [Alprazolam] Other (See Comments)    insomnia  . Lyrica [Pregabalin] Other (See Comments)    unknown    ACT Assessment Complete:  NO Objective: Blood pressure 113/63, pulse 58, temperature 98.6 F (37 C), temperature source Oral, resp. rate 9, height '4\' 10"'  (1.473 m), weight 63.4 kg (139 lb 12.4 oz), SpO2 99.00%.Body mass index is 29.22 kg/(m^2). Results for orders placed during the hospital encounter of 05/08/14 (from the past 72 hour(s))  CBC WITH DIFFERENTIAL     Status: Abnormal   Collection Time    05/08/14 11:22 PM      Result Value Ref Range   WBC 17.2 (*) 4.0 - 10.5 K/uL   RBC 4.92  3.87 - 5.11 MIL/uL   Hemoglobin 13.7  12.0 - 15.0 g/dL   HCT 41.5  36.0 - 46.0 %   MCV 84.3  78.0 - 100.0 fL   MCH 27.8  26.0 - 34.0 pg   MCHC 33.0  30.0 - 36.0 g/dL   RDW 13.7  11.5 - 15.5 %   Platelets 280  150 - 400 K/uL   Neutrophils Relative % 84 (*) 43 - 77 %   Neutro Abs 14.4 (*) 1.7 - 7.7 K/uL   Lymphocytes Relative 7 (*) 12 - 46 %   Lymphs Abs 1.2  0.7 - 4.0 K/uL   Monocytes Relative 9  3 - 12 %   Monocytes Absolute 1.5 (*) 0.1 - 1.0 K/uL   Eosinophils Relative 0  0 - 5 %   Eosinophils Absolute 0.1  0.0 - 0.7 K/uL   Basophils Relative 0  0 - 1 %   Basophils Absolute 0.0  0.0 - 0.1 K/uL  COMPREHENSIVE METABOLIC PANEL     Status: Abnormal   Collection Time    05/08/14 11:22 PM      Result Value Ref Range   Sodium 136 (*) 137 - 147 mEq/L   Potassium 3.8  3.7 - 5.3 mEq/L   Chloride 97  96 - 112 mEq/L   CO2 23  19 - 32 mEq/L   Glucose, Bld 168 (*) 70 - 99 mg/dL   BUN 18  6 - 23 mg/dL   Creatinine, Ser  1.34 (*) 0.50 - 1.10 mg/dL   Calcium 8.9  8.4 - 10.5 mg/dL   Total Protein 6.3  6.0 - 8.3 g/dL   Albumin 3.5  3.5 - 5.2 g/dL   AST 38 (*) 0 - 37 U/L   ALT 24  0 - 35 U/L   Alkaline Phosphatase 94  39 - 117 U/L   Total Bilirubin <0.2 (*) 0.3 - 1.2 mg/dL   GFR calc non Af Amer 40 (*) >  90 mL/min   GFR calc Af Amer 46 (*) >90 mL/min   Comment: (NOTE)     The eGFR has been calculated using the CKD EPI equation.     This calculation has not been validated in all clinical situations.     eGFR's persistently <90 mL/min signify possible Chronic Kidney     Disease.   Anion gap 16 (*) 5 - 15  ETHANOL     Status: None   Collection Time    05/08/14 11:22 PM      Result Value Ref Range   Alcohol, Ethyl (B) <11  0 - 11 mg/dL   Comment:            LOWEST DETECTABLE LIMIT FOR     SERUM ALCOHOL IS 11 mg/dL     FOR MEDICAL PURPOSES ONLY  ACETAMINOPHEN LEVEL     Status: None   Collection Time    05/08/14 11:22 PM      Result Value Ref Range   Acetaminophen (Tylenol), Serum 23.9  10 - 30 ug/mL   Comment:            THERAPEUTIC CONCENTRATIONS VARY     SIGNIFICANTLY. A RANGE OF 10-30     ug/mL MAY BE AN EFFECTIVE     CONCENTRATION FOR MANY PATIENTS.     HOWEVER, SOME ARE BEST TREATED     AT CONCENTRATIONS OUTSIDE THIS     RANGE.     ACETAMINOPHEN CONCENTRATIONS     >150 ug/mL AT 4 HOURS AFTER     INGESTION AND >50 ug/mL AT 12     HOURS AFTER INGESTION ARE     OFTEN ASSOCIATED WITH TOXIC     REACTIONS.  SALICYLATE LEVEL     Status: Abnormal   Collection Time    05/08/14 11:22 PM      Result Value Ref Range   Salicylate Lvl <8.8 (*) 2.8 - 20.0 mg/dL  URINE RAPID DRUG SCREEN (HOSP PERFORMED)     Status: Abnormal   Collection Time    05/09/14 12:08 AM      Result Value Ref Range   Opiates POSITIVE (*) NONE DETECTED   Cocaine NONE DETECTED  NONE DETECTED   Benzodiazepines POSITIVE (*) NONE DETECTED   Amphetamines NONE DETECTED  NONE DETECTED   Tetrahydrocannabinol NONE DETECTED  NONE  DETECTED   Barbiturates NONE DETECTED  NONE DETECTED   Comment:            DRUG SCREEN FOR MEDICAL PURPOSES     ONLY.  IF CONFIRMATION IS NEEDED     FOR ANY PURPOSE, NOTIFY LAB     WITHIN 5 DAYS.                LOWEST DETECTABLE LIMITS     FOR URINE DRUG SCREEN     Drug Class       Cutoff (ng/mL)     Amphetamine      1000     Barbiturate      200     Benzodiazepine   325     Tricyclics       498     Opiates          300     Cocaine          300     THC              50  URINALYSIS, ROUTINE W REFLEX MICROSCOPIC     Status: Abnormal  Collection Time    05/09/14 12:08 AM      Result Value Ref Range   Color, Urine YELLOW  YELLOW   APPearance CLOUDY (*) CLEAR   Specific Gravity, Urine 1.018  1.005 - 1.030   pH 5.5  5.0 - 8.0   Glucose, UA NEGATIVE  NEGATIVE mg/dL   Hgb urine dipstick NEGATIVE  NEGATIVE   Bilirubin Urine NEGATIVE  NEGATIVE   Ketones, ur NEGATIVE  NEGATIVE mg/dL   Protein, ur NEGATIVE  NEGATIVE mg/dL   Urobilinogen, UA 0.2  0.0 - 1.0 mg/dL   Nitrite NEGATIVE  NEGATIVE   Leukocytes, UA MODERATE (*) NEGATIVE  URINE MICROSCOPIC-ADD ON     Status: Abnormal   Collection Time    05/09/14 12:08 AM      Result Value Ref Range   Squamous Epithelial / LPF RARE  RARE   WBC, UA 11-20  <3 WBC/hpf   RBC / HPF 0-2  <3 RBC/hpf   Bacteria, UA RARE  RARE   Casts HYALINE CASTS (*) NEGATIVE   Urine-Other MUCOUS PRESENT    HEPATIC FUNCTION PANEL     Status: Abnormal   Collection Time    05/09/14  4:51 AM      Result Value Ref Range   Total Protein 6.5  6.0 - 8.3 g/dL   Albumin 3.7  3.5 - 5.2 g/dL   AST 35  0 - 37 U/L   ALT 23  0 - 35 U/L   Alkaline Phosphatase 92  39 - 117 U/L   Total Bilirubin <0.2 (*) 0.3 - 1.2 mg/dL   Bilirubin, Direct <0.2  0.0 - 0.3 mg/dL   Indirect Bilirubin NOT CALCULATED  0.3 - 0.9 mg/dL  BASIC METABOLIC PANEL     Status: Abnormal   Collection Time    05/09/14  4:51 AM      Result Value Ref Range   Sodium 139  137 - 147 mEq/L   Potassium 4.0   3.7 - 5.3 mEq/L   Chloride 102  96 - 112 mEq/L   CO2 22  19 - 32 mEq/L   Glucose, Bld 141 (*) 70 - 99 mg/dL   BUN 14  6 - 23 mg/dL   Creatinine, Ser 1.10  0.50 - 1.10 mg/dL   Calcium 8.9  8.4 - 10.5 mg/dL   GFR calc non Af Amer 50 (*) >90 mL/min   GFR calc Af Amer 58 (*) >90 mL/min   Comment: (NOTE)     The eGFR has been calculated using the CKD EPI equation.     This calculation has not been validated in all clinical situations.     eGFR's persistently <90 mL/min signify possible Chronic Kidney     Disease.   Anion gap 15  5 - 15  CBC WITH DIFFERENTIAL     Status: Abnormal   Collection Time    05/09/14  4:51 AM      Result Value Ref Range   WBC 12.2 (*) 4.0 - 10.5 K/uL   RBC 4.78  3.87 - 5.11 MIL/uL   Hemoglobin 13.0  12.0 - 15.0 g/dL   HCT 40.1  36.0 - 46.0 %   MCV 83.9  78.0 - 100.0 fL   MCH 27.2  26.0 - 34.0 pg   MCHC 32.4  30.0 - 36.0 g/dL   RDW 14.0  11.5 - 15.5 %   Platelets 278  150 - 400 K/uL   Neutrophils Relative % 79 (*) 43 - 77 %  Neutro Abs 9.7 (*) 1.7 - 7.7 K/uL   Lymphocytes Relative 15  12 - 46 %   Lymphs Abs 1.8  0.7 - 4.0 K/uL   Monocytes Relative 6  3 - 12 %   Monocytes Absolute 0.7  0.1 - 1.0 K/uL   Eosinophils Relative 0  0 - 5 %   Eosinophils Absolute 0.0  0.0 - 0.7 K/uL   Basophils Relative 0  0 - 1 %   Basophils Absolute 0.0  0.0 - 0.1 K/uL  MRSA PCR SCREENING     Status: None   Collection Time    05/09/14  7:34 AM      Result Value Ref Range   MRSA by PCR NEGATIVE  NEGATIVE   Comment:            The GeneXpert MRSA Assay (FDA     approved for NASAL specimens     only), is one component of a     comprehensive MRSA colonization     surveillance program. It is not     intended to diagnose MRSA     infection nor to guide or     monitor treatment for     MRSA infections.  ACETAMINOPHEN LEVEL     Status: None   Collection Time    05/09/14 12:12 PM      Result Value Ref Range   Acetaminophen (Tylenol), Serum <15.0  10 - 30 ug/mL   Comment:             THERAPEUTIC CONCENTRATIONS VARY     SIGNIFICANTLY. A RANGE OF 10-30     ug/mL MAY BE AN EFFECTIVE     CONCENTRATION FOR MANY PATIENTS.     HOWEVER, SOME ARE BEST TREATED     AT CONCENTRATIONS OUTSIDE THIS     RANGE.     ACETAMINOPHEN CONCENTRATIONS     >150 ug/mL AT 4 HOURS AFTER     INGESTION AND >50 ug/mL AT 12     HOURS AFTER INGESTION ARE     OFTEN ASSOCIATED WITH TOXIC     REACTIONS.  SALICYLATE LEVEL     Status: Abnormal   Collection Time    05/09/14 12:12 PM      Result Value Ref Range   Salicylate Lvl <3.5 (*) 2.8 - 20.0 mg/dL   Labs are reviewed and are pertinent for .  Current Facility-Administered Medications  Medication Dose Route Frequency Provider Last Rate Last Dose  . acetaminophen (TYLENOL) tablet 650 mg  650 mg Oral Q6H PRN Rise Patience, MD       Or  . acetaminophen (TYLENOL) suppository 650 mg  650 mg Rectal Q6H PRN Rise Patience, MD      . enoxaparin (LOVENOX) injection 40 mg  40 mg Subcutaneous Q24H Rise Patience, MD   40 mg at 05/09/14 1329  . hydrALAZINE (APRESOLINE) injection 10 mg  10 mg Intravenous Q4H PRN Rise Patience, MD      . nicotine (NICODERM CQ - dosed in mg/24 hours) patch 21 mg  21 mg Transdermal Daily Allie Bossier, MD   21 mg at 05/09/14 1328  . ondansetron (ZOFRAN) tablet 4 mg  4 mg Oral Q6H PRN Rise Patience, MD       Or  . ondansetron [facility]) injection 4 mg  4 mg Intravenous Q6H PRN Rise Patience, MD        Psychiatric Specialty Exam: Physical Exam  ROS  Blood pressure 113/63,  pulse 58, temperature 98.6 F (37 C), temperature source Oral, resp. rate 9, height '4\' 10"'  (1.473 m), weight 63.4 kg (139 lb 12.4 oz), SpO2 99.00%.Body mass index is 29.22 kg/(m^2).  General Appearance: Guarded  Eye Contact::  Fair  Speech:  Clear and Coherent and Slow  Volume:  Decreased  Mood:  Anxious, Depressed, Dysphoric, Hopeless and Worthless  Affect:  Depressed and Flat  Thought Process:  Coherent and  Goal Directed  Orientation:  Full (Time, Place, and Person)  Thought Content:  WDL  Suicidal Thoughts:  Yes.  with intent/plan  Homicidal Thoughts:  No  Memory:  Immediate;   Fair Recent;   Fair  Judgement:  Impaired  Insight:  Lacking  Psychomotor Activity:  Psychomotor Retardation  Concentration:  Fair  Recall:  [place] of Knowledge:Good  Language: Good  Akathisia:  NA  Handed:  Right  AIMS (if indicated):     Assets:  Communication Skills Desire for Improvement Financial Resources/Insurance Housing Intimacy Leisure Time Resilience Social Support Talents/Skills  Sleep:      Musculoskeletal: Strength & Muscle Tone: within normal limits Gait & Station: normal Patient leans: N/A  Treatment Plan Summary: Daily contact with patient to assess and evaluate symptoms and progress in treatment Medication management  Joshus Rogan,JANARDHAHA R. 05/09/2014 1:40 PM

## 2014-05-09 NOTE — Care Management Note (Unsigned)
    Page 1 of 1   05/10/2014     12:31:46 PM CARE MANAGEMENT NOTE 05/10/2014  Patient:  Alexis Mclean, Alexis Mclean   Account Number:  [id number]  Date Initiated:  05/09/2014  Documentation initiated by:  GRAVES-BIGELOW,Kaelin Holford  Subjective/Objective Assessment:   Pt admitted for Confusion-Intentional drug overdose with suicidal ideation and depression- on suicide precautions. Psych to consult.     Action/Plan:   CM will continue to monitor for disposition needs.   Anticipated DC Date:  05/11/2014   Anticipated DC Plan:  HOME/SELF CARE  In-house referral  Clinical Social Worker      DC Planning Services  CM consult      Choice offered to / List presented to:             Status of service:  In process, will continue to follow Medicare Important Message given?   (If response is "NO", the following Medicare IM given date fields will be blank) Date Medicare IM given:   Medicare IM given by:   Date Additional Medicare IM given:   Additional Medicare IM given by:    Discharge Disposition:    Per UR Regulation:  Reviewed for med. necessity/level of care/duration of stay  If discussed at [place] of Stay Meetings, dates discussed:    Comments:  05-10-14 1229 Jacqlyn Krauss, RN,BSN [id number] CM did speak to Tonette Bihari with [facility] and is trying to assist CSW with the placement of pt to a [facility]. CM did call Barnett Applebaum CSW and relayed to her the information. No further needs from CM at this time.

## 2014-05-09 NOTE — ED Notes (Addendum)
Please call Daughter, [name]  [phone] with patient's room assignment when known. A voice message may be allowed.

## 2014-05-09 NOTE — Progress Notes (Signed)
UR Completed Taylen Osorto Graves-Bigelow, RN,BSN [phone]  

## 2014-05-10 MED ORDER — LEVOFLOXACIN 250 MG PO TABS: 250.0000 mg | ORAL_TABLET | Freq: Every day | ORAL | Status: DC

## 2014-05-10 MED ORDER — ACETAMINOPHEN 325 MG PO TABS: 650.0000 mg | ORAL_TABLET | Freq: Four times a day (QID) | ORAL | Status: DC | PRN

## 2014-05-10 MED ORDER — LEVOFLOXACIN 250 MG PO TABS
250.0000 mg | ORAL_TABLET | Freq: Every day | ORAL | Status: DC
  Administered 2014-05-10: 250 mg via ORAL
  Filled 2014-05-10: qty 1

## 2014-05-10 NOTE — Progress Notes (Signed)
Alexis Mclean to be D/C'd Alexis Mclean per MD order.  Discussed with the patient and all questions fully answered.    Medication List         acetaminophen 325 MG tablet  Commonly known as:  TYLENOL  Take 2 tablets (650 mg total) by mouth every 6 (six) hours as needed for mild pain (or Fever >/= 101).     levofloxacin 250 MG tablet  Commonly known as:  LEVAQUIN  Take 1 tablet (250 mg total) by mouth daily.        VVS, Skin clean, dry and intact without evidence of skin break down, no evidence of skin tears noted. IV catheter discontinued intact. Site without signs and symptoms of complications. Dressing and pressure applied.  An After Visit Summary was printed and given to the patient.  D/c education completed with patient/family including follow up instructions, medication list, d/c activities limitations if indicated, with other d/c instructions as indicated by MD - patient able to verbalize understanding, all questions fully answered.   Patient instructed to return to ED, call 911, or call MD for any changes in condition.   Patient escorted via [place], and D/C to [facility] psych via [employer].   Wonda Cerise D 05/10/2014 6:34 PM

## 2014-05-10 NOTE — Clinical Social Work Psych Note (Signed)
Psych CSW spoke with [name] at [facility] who reports assisting with placement for pt.  [facility] currently at capacity.  [name], [place] [phone]  Clinical Social Work

## 2014-05-10 NOTE — Clinical Social Work Psych Note (Addendum)
Psych CSW spoke with [name] at [facility] who reports that pt has been accepted to [facility] by Dr. Ronnald Ramp.  Alexis Mclean is requesting a little time prior to pt being transported.  [name] to coordinate transport with unit ([phone]).    RN to call (once [facility] states bed ready) Report # [phone] Transport [phone]  MD to discuss disposition with pt prior to dc.  MD and RN aware and agreeable for dc today.  [name], [place] [phone]  Clinical Social Work

## 2014-05-10 NOTE — Progress Notes (Signed)
Poison control has signed off.

## 2014-05-10 NOTE — Discharge Summary (Signed)
DISCHARGE SUMMARY  Alexis Mclean  MR#: [id number]  DOB:1946-08-25  Date of Admission: 05/08/2014 Date of Discharge: 05/10/2014  Attending Physician:Alexis Mclean  Patient's ZDG:[id number], Alexis Saxon, MD  Consults: Psychiatry  Disposition: D/C to inpatient psychiatric facility      Follow-up Information   Follow up with Alexis Frees, MD. (Contact your MD after you are discharged from the [facility] facility to arrange for a general medical follow up.  )    Specialty:  Family Medicine   Contact information:   [place] [address] [phone]      Discharge Diagnoses: Intentional drug overdose with suicidal ideation and depression  Major Depression, Recurrent severe HLD HTN T8 and T11 compression fractures w/ chronic back pain Tobacco abuse  UTI  Initial presentation: 68 y.o. female with history of chronic pain from vertebral fracture and hypertension who was brought to the ER after patient's family found she was getting increasingly confused and restless.  As per patient's daughter patient had been telling them that her pain was becoming unbearable and she was stating "it's not worth living." When daughter checked the patient's pill box 32 tablets of oxycodone 15 mg strength as well as an unspecified amount of trazodone and Benadryl liquid were all believed to have been consumed. Patient called EMS and patient was brought to the ER.   Hospital Course:  Intentional drug overdose with suicidal ideation and depression  Pt has been medically cleared from her intentional drug overdose, with no evidence of ongoing permanent physical sequela - she agrees to inpatient tx at [place] facility - she will be transferred today to begin her Psych tx - with her permission I have discussed this with her daughter Alexis Mclean who agrees with this tx plan   HLD Resume usual home medical tx  HTN BP modestly elevated, but pt also quite anxious -  does not appear to have been on meds prior to admit - follow for now w/o adding medical tx   T8 and T11 compression fractures w/ chronic back pain Psych team at tx facility to determine if narcotic pain meds safe to resume in future - for now will utilize only non-narcotic tx options   Tobacco abuse  Counseled on need to stop smoking  UTI UA +, but pt w/o sx - will give short 3 day course to assure does not interfere w/ inpatient Pshcy tx    Medication List         acetaminophen 325 MG tablet  Commonly known as:  TYLENOL  Take 2 tablets (650 mg total) by mouth every 6 (six) hours as needed for mild pain (or Fever >/= 101).     levofloxacin 250 MG tablet  Commonly known as:  LEVAQUIN  Take 1 tablet (250 mg total) by mouth daily.       Day of Discharge BP 147/87  Pulse 70  Temp(Src) 98 F (36.7 C) (Oral)  Resp 18  Ht 4\' 10"  (1.473 m)  Wt 63.4 kg (139 lb 12.4 oz)  BMI 29.22 kg/m2  SpO2 96%  Physical Exam: General: No acute respiratory distress Lungs: Clear to auscultation bilaterally without wheezes or crackles Cardiovascular: Regular rate and rhythm without murmur gallop or rub normal S1 and S2 Abdomen: Nontender, nondistended, soft, bowel sounds positive, no rebound, no ascites, no appreciable mass Extremities: No significant cyanosis, clubbing, or edema bilateral lower extremities  Time spent in discharge (includes decision making & examination of pt): >35 minutes  05/10/2014, 3:43  PM   Alexis Altes, MD [facility] Office  [phone] Pager [phone]  On-Call/Text Page:      [email or web address]      password [facility]

## 2014-05-10 NOTE — BH Assessment (Signed)
[place] Assessment Progress Note  At 14:15 I spoke to Morrow at [facility].  Pt has been accepted to their facility after they have discharges.  The accepting physician is Geanie Kenning, MD, and the number to call for nurse-to-nurse report is [phone].  Nunzio Cory will call the nursing station where the pt is located [phone]) when they are ready to receive the pt.  At 14:20 I spoke to Donia Pounds, the pt's social worker, to notify her.  Jalene Mullet, MA Triage Specialist 05/10/2014 @ 14:24

## 2014-05-17 ENCOUNTER — Ambulatory Visit (HOSPITAL_COMMUNITY)
Admission: RE | Admit: 2014-05-17 | Discharge: 2014-05-17 | Disposition: A | Discharge status: Home / Self Care | Payer: Medicare Other | Attending: Psychiatry | Admitting: Psychiatry

## 2014-05-17 NOTE — [facility] Counselor (Signed)
Writer called and spoke w/ Dr Kenton Kingfisher. Dr Kenton Kingfisher reports that pt's daughter called him re: continuing care for pt after her discharge from [facility] 05/16/14. Writer and [employer] discussed pt seeing a psychiatrist on a regular basis. Writer tells Kenton Kingfisher that Dr. Casimiro Needle, gero psychiatrist, first appt is late Aug. Dr Kenton Kingfisher reports he will be glad to help with her psych meds until she is able to see a psychiatrist. Writer will update [place] after assessment completed.  Arnold Long, [place] Assessment Counselor
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
## 2014-05-17 NOTE — [facility] Counselor (Signed)
Writer called and spoke w/ Dr Kenton Kingfisher. Dr Kenton Kingfisher reports that pt's daughter called him re: continuing care for pt after her discharge from [facility] 05/16/14. Writer and [employer] discussed pt seeing a psychiatrist on a regular basis. Writer tells Kenton Kingfisher that Dr. Casimiro Needle, gero psychiatrist, first appt is late Aug. Dr Kenton Kingfisher reports he will be glad to help with her psych meds until she is able to see a psychiatrist. Writer will update [place] after assessment completed.  Arnold Long, [place]  Assessment Counselor

## 2014-05-17 NOTE — [facility] Counselor (Signed)
TC from pt's daughter, Rolley Sims. Writer discussed options for psychiatry appointments as Dr Casimiro Needle no longer takes pt's [employer].Writer called Levada Dy at [facility] outpatient. Pt now has initial appointment with Dr. Adele Schilder 07/04/14 at 1 pm. Pt to arrive 15 mins early to do paperwork. Writer called Judeen Hammans back and Heritage manager.  Arnold Long, [place] Assessment Counselor

## 2014-07-04 ENCOUNTER — Ambulatory Visit (INDEPENDENT_AMBULATORY_CARE_PROVIDER_SITE_OTHER): Payer: Medicare Other | Admitting: Psychiatry

## 2014-07-04 ENCOUNTER — Encounter (HOSPITAL_COMMUNITY): Payer: Self-pay | Admitting: Psychiatry

## 2014-07-04 VITALS — BP 142/89 | HR 79 | Ht <= 58 in | Wt 139.0 lb

## 2014-07-04 DIAGNOSIS — F321 Major depressive disorder, single episode, moderate: Secondary | ICD-10-CM

## 2014-07-04 DIAGNOSIS — F339 Major depressive disorder, recurrent, unspecified: Secondary | ICD-10-CM
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
## 2014-07-19 ENCOUNTER — Ambulatory Visit (INDEPENDENT_AMBULATORY_CARE_PROVIDER_SITE_OTHER): Payer: BC Managed Care – PPO | Admitting: Psychology

## 2014-07-19 ENCOUNTER — Encounter (HOSPITAL_COMMUNITY): Payer: Self-pay | Admitting: Psychology

## 2014-07-19 DIAGNOSIS — F331 Major depressive disorder, recurrent, moderate: Secondary | ICD-10-CM

## 2014-07-20 ENCOUNTER — Encounter (HOSPITAL_COMMUNITY): Payer: Self-pay | Admitting: Psychology

## 2014-07-20 DIAGNOSIS — F339 Major depressive disorder, recurrent, unspecified: Secondary | ICD-10-CM | POA: Insufficient documentation
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
## 2014-08-01 ENCOUNTER — Encounter (HOSPITAL_COMMUNITY): Payer: Self-pay | Admitting: Psychiatry

## 2014-08-01 ENCOUNTER — Ambulatory Visit (INDEPENDENT_AMBULATORY_CARE_PROVIDER_SITE_OTHER): Payer: BC Managed Care – PPO | Admitting: Psychiatry

## 2014-08-01 VITALS — BP 109/76 | HR 77 | Ht 59.5 in | Wt 140.0 lb

## 2014-08-01 DIAGNOSIS — F339 Major depressive disorder, recurrent, unspecified: Secondary | ICD-10-CM

## 2014-08-01 DIAGNOSIS — F321 Major depressive disorder, single episode, moderate: Secondary | ICD-10-CM

## 2014-08-01 MED ORDER — QUETIAPINE FUMARATE 100 MG PO TABS: 100.0000 mg | ORAL_TABLET | Freq: Every day | ORAL | Status: DC

## 2014-08-01 MED ORDER — DULOXETINE HCL 60 MG PO CPEP: 60.0000 mg | ORAL_CAPSULE | Freq: Every day | ORAL | Status: DC
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
## 2014-08-09 ENCOUNTER — Ambulatory Visit (INDEPENDENT_AMBULATORY_CARE_PROVIDER_SITE_OTHER): Payer: No Typology Code available for payment source | Admitting: Psychology

## 2014-08-09 DIAGNOSIS — F331 Major depressive disorder, recurrent, moderate: Secondary | ICD-10-CM

## 2014-08-09 NOTE — Progress Notes (Signed)
   THERAPIST PROGRESS NOTE  Session Time: 11.05am-12:02pm  Participation Level: Active  Behavioral Response: Well GroomedAlertDepressed  Type of Therapy: Individual Therapy  Treatment Goals addressed: Diagnosis: MDD and goal 1.  Interventions: CBT and Other: Mindfulness  Summary: Alexis Mclean is a 68 y.o. female who presents with report of improvement w/ mood- feeling more freeing moments recently.  Pt reports she is sleeping about 9 hours and easy to get up w/ reduced Klonopin.  Pt reports pain has been reducing some days and feeling hopeful about healing.  Pt discussed getting outside and enjoying her time w/ this.  Pt reports that she misses being able to the yard work and would like to have something she feels like she is in control, capable of and caring for. Pt discussed her mental "blocks" and how she is aware that she has been doing this since childhood as coping- but bothered by not be able to recall and remember things that she does w/ others at later time.  Pt discussed awareness also of struggles w/ trust and how this impacts building closeness in relationships.  Pt expressed felt comfortable to discuss w/ therapist.  Pt discussed her spiritual beliefs and how childhood taught beliefs she doesn't agree and how these were challenged for self throughout life.  Pt discussed her belief in reincarnation.  Pt discussed that use of breath work and mindfulness will be important to present to experiences in day to day.   Suicidal/Homicidal: Nowithout intent/plan  Therapist Response: Assessed pt current functioning per her report.  Discussed w/ pt increased energy and improvements w/ mood and potential contributing factors.  Explored w/pt use of self care and how to identify things that give her sense of control.  Explored w/pt mental blocks and psycho education about trauma and how likely developed coping skills that served purpose during trauma but now not adaptive.  Discussed use of  mindfulness through breath work and use of senses to stay present in interactions.     Plan: Return again in 2 weeks.  Diagnosis: Axis I: MDD, recurrent moderate    Axis II: No diagnosis    Prabhjot Maddux, LPC 08/09/2014

## 2014-08-15 ENCOUNTER — Telehealth (HOSPITAL_COMMUNITY): Payer: Self-pay

## 2014-08-15 ENCOUNTER — Other Ambulatory Visit (HOSPITAL_COMMUNITY): Payer: Self-pay | Admitting: Psychiatry

## 2014-08-15 NOTE — Telephone Encounter (Signed)
I return patient phone call and left message.

## 2014-08-22 ENCOUNTER — Telehealth (HOSPITAL_COMMUNITY): Payer: Self-pay | Admitting: *Deleted

## 2014-08-22 ENCOUNTER — Telehealth (HOSPITAL_COMMUNITY): Payer: Self-pay | Admitting: Psychiatry

## 2014-08-22 NOTE — Telephone Encounter (Signed)
Patient left VM: Stopped Klonopin a few weeks ago. Continuing to take Seroquel at night. Still sleeping 11- 12 hours at night. Wants to know what is the next step? Wants to speak with provider.

## 2014-08-22 NOTE — Telephone Encounter (Signed)
I returned patient's phone call.  Despite stopping Klonopin she continues to sleep about 12 hours.  I recommended to cut down Seroquel half tablet to see if that improves.  Recommended to call us back if she continues to have hypersomnia.

## 2014-08-30 ENCOUNTER — Ambulatory Visit (HOSPITAL_COMMUNITY): Payer: Self-pay | Admitting: Psychology

## 2014-09-05 ENCOUNTER — Ambulatory Visit (HOSPITAL_COMMUNITY): Payer: Self-pay | Admitting: Psychology

## 2014-09-13 ENCOUNTER — Ambulatory Visit (HOSPITAL_COMMUNITY): Payer: Self-pay | Admitting: Psychology

## 2014-09-18 ENCOUNTER — Telehealth (HOSPITAL_COMMUNITY): Payer: Self-pay

## 2014-09-18 NOTE — Telephone Encounter (Signed)
I returned patient's phone call.  She tried one fourth Seroquel and she is still sleeping too much.  She wants to come off from Seroquel.  She also read about side effects and she does not want to take it.  She is taking Cymbalta as prescribed.  I recommended to call us back if she feels worst after stopping the Seroquel otherwise I will see her on her appointment.

## 2014-09-20 ENCOUNTER — Ambulatory Visit (INDEPENDENT_AMBULATORY_CARE_PROVIDER_SITE_OTHER): Payer: No Typology Code available for payment source | Admitting: Psychology

## 2014-09-20 DIAGNOSIS — F331 Major depressive disorder, recurrent, moderate: Secondary | ICD-10-CM

## 2014-09-22 NOTE — Progress Notes (Signed)
   THERAPIST PROGRESS NOTE  Session Time: 11am-11.55am  Participation Level: Active  Behavioral Response: Well GroomedAlertDepressed  Type of Therapy: Individual Therapy  Treatment Goals addressed: Diagnosis: MDD and goal 1.  Interventions: CBT and Other: Mindfulness  Summary: Alexis Mclean is a 68 y.o. female who presents with depressed mood and affect.  Pt reported that still struggling w/ pain, sleep disturbance is less- but feels that depressed mood is not improving.  Pt discussed awareness that winter months are typically a struggle for her and also is being bothered by lack of remembering.  Pt increased awareness of past childhood experiences and impact that has likely had on her patterns of thinking and unhealthy coping developed.  Pt increased awareness of need to work w/ body, breathe, mindfulness for making changes.  Pt intially focused on barriers w/ this do to limitations of her body and dislike for winter- but receptive to finding ways for modified gardening, using mindfulness to see beauty and life during winter months.  Pt also aware for need to keep connected w/ supports and use of self compassion.    Suicidal/Homicidal: Nowithout intent/plan  Therapist Response: Assessed pt current functioning per pt report.  Processed w/ pt mood and contributing factors.  psychoeducation w/ pt on effects of trauma and working w/ trauma through "bottom up processes" that focus on body work, breath, mindfulness meditation.  Assisted pt in identify ways to make these accessible w/her limitations.  Discussed w/ pt self compassion and using this w/ self talk, accepting self where she is and not placing expectations on self.   Plan: Return again in 2 weeks.  Diagnosis:  Major Depression, Recurrent      YATES,LEANNE, LPC 09/22/2014

## 2014-09-25 ENCOUNTER — Ambulatory Visit (HOSPITAL_COMMUNITY): Payer: Self-pay | Admitting: Psychiatry

## 2014-10-11 ENCOUNTER — Other Ambulatory Visit (HOSPITAL_COMMUNITY): Payer: Self-pay | Admitting: Psychiatry

## 2014-10-13 ENCOUNTER — Other Ambulatory Visit (HOSPITAL_COMMUNITY): Payer: Self-pay | Admitting: Psychiatry

## 2014-10-18 ENCOUNTER — Ambulatory Visit (INDEPENDENT_AMBULATORY_CARE_PROVIDER_SITE_OTHER): Payer: No Typology Code available for payment source | Admitting: Psychology

## 2014-10-18 DIAGNOSIS — F3341 Major depressive disorder, recurrent, in partial remission: Secondary | ICD-10-CM

## 2014-10-18 NOTE — Progress Notes (Signed)
THERAPIST PROGRESS NOTE  Session Time: 12.35pm-1.27pm  Participation Level: Active  Behavioral Response: Well GroomedAlert, Alexis Mclean  Type of Therapy: Individual Therapy  Treatment Goals addressed: Diagnosis: MDD and goal 1.   Interventions: CBT and Supportive  Summary: Alexis Mclean is a 68 y.o. female who presents with full and bright Alexis.  Pt reported that she has been driving herself now and feels good about that.  Pt reported that she is working w/ her PCP for her medications now and has stopped the Seroquel.  Pt reports since stopping feels better w/ her mood and energy. Pt also reports that she is tapering off the Cymbalta under doctor supervision.  Pt reports she is not experiencing depressed mood. Pt reports she has been engaged and doing things that she enjoys. Pt reports that she does experience pain and that the inhibits her ability to do certain things but she is accepting this and modifying things in her life. Pt discussed feeling good during the holidays and really enjoys the interactions and small things. Pt discussed interactions w/ her daughter and how she has become more aware of how daughter actions are external to who she is.  Pt expressed that she want to call if needed for further counseling at this time because feels that she has met what she has needed at this time.   Suicidal/Homicidal: Nowithout intent/plan  Therapist Response: Assessed pt current functioning per pt report.  Explored w/pt her mood and steps pt has been taking w/ self care and interventions.  Processed w/pt role of pain in functioning and how she is managing with her self care.  Encouraged pt to still find ways to enjoy garden w/her physical limitations.   Explored w/pt upcoming holidays and discussed pt plan of care.   Plan: Return again in if needed with in the next 8 weeks.  Pt to call if needed- return of depressive symptoms.  Pt to f/u w/ PCP who pt reports is now her prescribing  provider.   Diagnosis:  MDD        Jan Fireman, [facility] 10/18/2014

## 2014-10-19 ENCOUNTER — Ambulatory Visit (HOSPITAL_COMMUNITY): Payer: Self-pay | Admitting: Psychiatry

## 2014-10-30 ENCOUNTER — Ambulatory Visit (HOSPITAL_COMMUNITY): Payer: Self-pay | Admitting: Psychiatry

## 2014-11-01 ENCOUNTER — Ambulatory Visit (HOSPITAL_COMMUNITY): Payer: Self-pay | Admitting: Psychology

## 2014-11-15 ENCOUNTER — Ambulatory Visit (HOSPITAL_COMMUNITY): Payer: Self-pay | Admitting: Psychology

## 2014-11-29 ENCOUNTER — Ambulatory Visit (HOSPITAL_COMMUNITY): Payer: Self-pay | Admitting: Psychology

## 2015-01-03 ENCOUNTER — Other Ambulatory Visit: Payer: Self-pay

## 2015-01-03 DIAGNOSIS — Z1231 Encounter for screening mammogram for malignant neoplasm of breast: Secondary | ICD-10-CM

## 2015-01-05 ENCOUNTER — Ambulatory Visit
Admission: RE | Admit: 2015-01-05 | Discharge: 2015-01-05 | Disposition: A | Discharge status: Home / Self Care | Payer: Medicare Other | Source: Ambulatory Visit

## 2015-01-05 DIAGNOSIS — Z1231 Encounter for screening mammogram for malignant neoplasm of breast: Secondary | ICD-10-CM

## 2015-05-09 ENCOUNTER — Encounter (HOSPITAL_COMMUNITY): Payer: Self-pay | Admitting: Psychology

## 2015-05-09 NOTE — Progress Notes (Signed)
Alexis Mclean is a 69 y.o. female patient who was discharged from counseling as met her goals.  Outpatient Therapist Discharge Summary  GRADY LUCCI    December 05, 1945   Admission Date: 07/19/14   Discharge Date:  01/17/15 Reason for Discharge:  Pt feels she has meet goals for counseling Diagnosis:  MDD, in partial remission at last session   Comments:  Last seen 10/18/14  Jenne Campus, [facility]

## 2015-12-28 ENCOUNTER — Other Ambulatory Visit: Payer: Self-pay

## 2015-12-28 DIAGNOSIS — Z1231 Encounter for screening mammogram for malignant neoplasm of breast: Secondary | ICD-10-CM

## 2016-01-25 ENCOUNTER — Ambulatory Visit: Payer: Self-pay

## 2017-03-19 ENCOUNTER — Ambulatory Visit (INDEPENDENT_AMBULATORY_CARE_PROVIDER_SITE_OTHER): Payer: Medicare Other | Admitting: Family Medicine

## 2017-03-19 ENCOUNTER — Encounter: Payer: Self-pay | Admitting: Family Medicine

## 2017-03-19 ENCOUNTER — Telehealth: Payer: Self-pay | Admitting: Family Medicine

## 2017-03-19 VITALS — BP 146/82 | HR 77 | Temp 98.8°F | Ht <= 58 in | Wt 139.7 lb

## 2017-03-19 DIAGNOSIS — E1169 Type 2 diabetes mellitus with other specified complication: Secondary | ICD-10-CM | POA: Insufficient documentation

## 2017-03-19 DIAGNOSIS — G894 Chronic pain syndrome: Secondary | ICD-10-CM | POA: Diagnosis not present

## 2017-03-19 DIAGNOSIS — E119 Type 2 diabetes mellitus without complications: Secondary | ICD-10-CM | POA: Diagnosis not present

## 2017-03-19 DIAGNOSIS — E785 Hyperlipidemia, unspecified: Secondary | ICD-10-CM | POA: Diagnosis not present

## 2017-03-19 DIAGNOSIS — M792 Neuralgia and neuritis, unspecified: Secondary | ICD-10-CM | POA: Insufficient documentation

## 2017-03-19 DIAGNOSIS — I150 Renovascular hypertension: Secondary | ICD-10-CM | POA: Diagnosis not present

## 2017-03-19 DIAGNOSIS — F339 Major depressive disorder, recurrent, unspecified: Secondary | ICD-10-CM

## 2017-03-19 DIAGNOSIS — Z720 Tobacco use: Secondary | ICD-10-CM | POA: Diagnosis not present

## 2017-03-19 DIAGNOSIS — Z716 Tobacco abuse counseling: Secondary | ICD-10-CM | POA: Insufficient documentation

## 2017-03-19 DIAGNOSIS — R45851 Suicidal ideations: Secondary | ICD-10-CM

## 2017-03-19 MED ORDER — GLUCOSE BLOOD VI STRP
ORAL_STRIP | 12 refills | Status: DC
Start: 2017-03-19 — End: 2017-03-19

## 2017-03-19 MED ORDER — ONETOUCH ULTRA SYSTEM W/DEVICE KIT: 1.0000 | PACK | Freq: Once | 0 refills | Status: DC

## 2017-03-19 NOTE — Assessment & Plan Note (Signed)
Told pt that due to her multiple allergies and need for narcotic mgt; if she does not want to be referred to a ortho spine or interventional pain doc for mgt of her pain, then she would have to stay with her current PCP who gives her these meds, as I would not be able to do so.   Explained my limitations as a family doc and that I do not tx chronic pain as she needs.   - pt understood completely and was appreciative of my explaination.  - She decided to keep with her current PCP and NOT tx care to [place].

## 2017-03-19 NOTE — Progress Notes (Signed)
New patient office visit note:  SHE WILL NOT BE transferring her care here!!  Impression and Recommendations:    1. Diet-Controlled type 2 diabetes mellitus without complication, without long-term current use of insulin (Alexis Mclean)   2. Chronic pain syndrome   3. Renovascular hypertension   4. Episode of recurrent major depressive disorder, unspecified depression episode severity (Alexis Mclean)   5. Tobacco abuse   6. Tobacco abuse counseling   7. Neuropathic pain of chest   8. Hyperlipidemia, unspecified hyperlipidemia type   9. Suicidal ideation    HTN (hypertension) Goal BP's r/w pt   rec walking on a forgiving surface such as a track  Low salt  Controlled type 2 diabetes mellitus without complication, without long-term current use of insulin ([name]) Pt's A1c is at goal-- 7.3 today.  Handouts given after counseling done re: goals of A1c  7.5- 8.0  Diet and lifesytle counsleing  Tobacco abuse Discussion had and hadnouts given to pt  Pre-comtemplative stages- not ready  Info given   Tobacco abuse counseling 5-5min counseling given  Suicidal ideation long h/o signficant depression including SI  Chronic pain syndrome Told pt that due to her multiple allergies and need for narcotic mgt; if she does not want to be referred to a ortho spine or interventional pain doc for mgt of her pain, then she would have to stay with her current PCP who gives her these meds, as I would not be able to do so.   Explained my limitations as a family doc and that I do not tx chronic pain as she needs.   - pt understood completely and was appreciative of my explaination.  - She decided to keep with her current PCP and NOT tx care to [place].    Major depressive disorder, recurrent episode (Alexis Mclean) Stable, well adjusted today, appropriate   The patient was counseled, risk factors were discussed, anticipatory guidance given.    Orders Placed This Encounter  Procedures  . POCT HgB A1C     Gross  side effects, risk and benefits, and alternatives of medications discussed with patient.  Patient is aware that all medications have potential side effects and we are unable to predict every side effect or drug-drug interaction that may occur.  Expresses verbal understanding and consents to current therapy plan and treatment regimen.  Return for f/up with PCP for chronic care mgt and pain mgt.  Please see AVS handed out to patient at the end of our visit for further patient instructions/ counseling done pertaining to today's office visit.    Note: This document was prepared using Dragon voice recognition software and may include unintentional dictation errors.  ----------------------------------------------------------------------------------------------------------------------    Subjective:    Chief complaint:   Chief Complaint  Patient presents with  . Establish Care  . Neck Pain    for years, getting worse     HPI: Alexis Mclean is a pleasant 71 y.o. female who presents to [facility] today to review their medical history with me and establish care.   I asked the patient to review their chronic problem list with me to ensure everything was updated and accurate.    All recent office visits with other providers, any medical records that patient brought in etc  - I reviewed today.     ---> Current PCP:   Dr Alexis Mclean at [facility].    1) DM:  About 3 yrs she has had it now.  -  last A1c 7.2 about 2 months ago.   Today 7.3.     - meter broken at home this AM- now doesn't have one.  Diet controlled.   FBS--->  130's-170's, she brings in log today.  No highs or lows  2) Chronic pain:  - her current PCP gives her narcotic pain meds for chronic back pain.  She doesn't want to drive into [place] for specialist.  She has failed intrathecal pain pump, she was sent to Pain mgt and they would not give her her narcotics/ said nothing else they could do for  her.    Wt Readings from Last 3 Encounters:  03/19/17 139 lb 11.2 oz (63.4 kg)  05/09/14 139 lb 12.4 oz (63.4 kg)  03/22/13 162 lb (73.5 kg)   BP Readings from Last 3 Encounters:  03/19/17 (!) 146/82  05/10/14 (!) 146/86  03/24/14 158/96   Pulse Readings from Last 3 Encounters:  03/19/17 77  05/10/14 70  03/24/14 78   BMI Readings from Last 3 Encounters:  03/19/17 29.20 kg/m  05/09/14 29.21 kg/m  03/22/13 32.72 kg/m    Patient Care Team    Relationship Specialty Notifications Start End  Shirline Frees, MD PCP - General Family Medicine  03/19/17     Patient Active Problem List   Diagnosis Date Noted  . Controlled type 2 diabetes mellitus without complication, without long-term current use of insulin (Alexis Mclean) 03/19/2017    Priority: High  . Tobacco abuse counseling 03/19/2017    Priority: High  . HTN (hypertension) 05/09/2014    Priority: High  . Tobacco abuse 05/09/2014    Priority: High  . Major depressive disorder, recurrent episode ([place]) 07/20/2014    Priority: Medium  . Suicidal ideation 05/09/2014    Priority: Medium  . Chronic pain syndrome 05/09/2014    Priority: Medium  . Neuropathic pain of chest 03/19/2017  . Hyperlipemia 03/19/2017  . Low back pain 05/09/2014     Past Medical History:  Diagnosis Date  . Back pain   . Depression   . Fracture of T11 vertebra ([place]) may 2015  . Fracture of T8 vertebra ([place]) 12/2013  . Hyperlipemia   . Hypertension   . Neuropathic pain of chest      Past Medical History:  Diagnosis Date  . Back pain   . Depression   . Fracture of T11 vertebra ([place]) may 2015  . Fracture of T8 vertebra ([place]) 12/2013  . Hyperlipemia   . Hypertension   . Neuropathic pain of chest      Past Surgical History:  Procedure Laterality Date  . ABDOMINAL HYSTERECTOMY    . APPENDECTOMY    . BACK SURGERY  03/2013   fusion  . BLADDER REPAIR    . salpingoorrphorectomy Right   . TONSILLECTOMY    . WISDOM TOOTH EXTRACTION        Family History  Problem Relation Age of Onset  . Stroke Mother   . Diabetes Mother   . Depression Mother   . Diabetes Father   . Emphysema Father   . Cancer Father        Lung  . Asthma Father   . Diabetes Sister   . Diabetes Brother      History  Drug Use No     History  Alcohol Use No     History  Smoking Status  . Current Every Day Smoker  . Packs/day: 1.33  . Years: 30.00  . Types: Cigarettes  . Last  attempt to quit: 03/26/2014  Smokeless Tobacco  . Never Used    Allergies: Morphine and related; Fluocinolone; Latex; Neurontin [gabapentin]; Penicillins; Sulfa drugs cross reactors; Xanax [alprazolam]; Ivp dye [iodinated diagnostic agents]; and Lyrica [pregabalin]   Review of Systems  Constitutional: Negative for chills, diaphoresis, fever, malaise/fatigue and weight loss.  HENT: Negative for congestion, sore throat and tinnitus.   Eyes: Negative for blurred vision, double vision and photophobia.  Respiratory: Negative for cough and wheezing.   Cardiovascular: Negative for chest pain and palpitations.  Gastrointestinal: Negative for blood in stool, diarrhea, nausea and vomiting.  Genitourinary: Negative for dysuria, frequency and urgency.  Musculoskeletal: Negative for joint pain and myalgias.  Skin: Negative for itching and rash.  Neurological: Negative for dizziness, focal weakness, weakness and headaches.  Endo/Heme/Allergies: Negative for environmental allergies and polydipsia. Does not bruise/bleed easily.  Psychiatric/Behavioral: Negative for depression and memory loss. The patient is not nervous/anxious and does not have insomnia.      Objective:   Blood pressure (!) 146/82, pulse 77, temperature 98.8 F (37.1 C), temperature source Oral, height 4\' 10"  (1.473 m), weight 139 lb 11.2 oz (63.4 kg), SpO2 98 %. Body mass index is 29.2 kg/m. General: Well Developed, well nourished, and in no acute distress.  Neuro: Alert and oriented x3,  extra-ocular muscles intact, sensation grossly intact.  HEENT:[place]/AT, PERRLA, neck supple, No carotid bruits Skin: no gross rashes  Cardiac: Regular rate and rhythm Respiratory: Essentially clear to auscultation bilaterally. Not using accessory muscles, speaking in full sentences.  Abdominal: not grossly distended Musculoskeletal: Ambulates w/o diff, FROM * 4 ext.  Vasc: less 2 sec cap RF, warm and pink  Psych:  No HI/SI, judgement and insight good, Euthymic mood. Full Affect.    No results found for this or any previous visit (from the past 2160 hour(s)).

## 2017-03-19 NOTE — Patient Instructions (Signed)
Glycemic targets - There are few data specifically addressing optimal glycemic goals in medication-treated older patients [3-5]. Goals for glycemic control, as well as risk factor management, should be based upon the individual's overall health and projected period of survival since the risk of complications is duration dependent. The appropriate target for glycated hemoglobin (A1C) in fit, older patients who have a life expectancy of over 10 years should be individualized based on the factors above, on identified patient-specific risks for hypoglycemia, and on the ability of the patients to adopt and adhere to specific treatment regimens. (See 'Drug therapy' below and 'Persistent hyperglycemia' below.)  ?In the absence of any long-term, clinical trial data in fit older populations, an A1C goal of <7.5 percent (58.5 mmol/mol) should be considered in medication-treated patients. To achieve this goal, fasting and preprandial glucoses should be between 140 and 150 mg/dL (7.8 to 8.3 mmol/L) (calculator 1) [6].  ?The glycemic goal should be somewhat higher (A1C ?8.0 percent, fasting and preprandial glucoses between 160 and 170 mg/dL [8.9 to 9.4 mmol/L]) in medication-treated, frail, older adults with medical and functional comorbidities and in those whose life expectancy is less than 10 years.  ?Individualized goals for the very old may be even higher (A1C <8.5 percent) and should include efforts to preserve quality of life and avoid hypoglycemia and related complications. An A1C of 8.5 percent equates to an estimated average glucose of 200 mg/dL (11.1 mmol/L).  These goals are consistent with the [place] (AGS), the American Diabetes Association (ADA), the International Diabetes Federation (IDF), and the European Diabetes Working Party guidelines [1,5,7-11]. (See "Glycemic control and vascular complications in type 2 diabetes mellitus", section on 'Glycemic targets'.)   Guidelines for  Losing Weight   We want weight loss that will last so you should lose 1-2 pounds a week.  THAT IS IT! Please pick THREE things a month to change. Once it is a habit check off the item. Then pick another three items off the list to become habits.  If you are already doing a habit on the list GREAT!  Cross that item off!  Don't drink your calories. Ie, alcohol, soda, fruit juice, and sweet tea.   Drink more water. Drink a glass when you feel hungry or before each meal.   Eat breakfast - Complex carb and protein (likeDannon light and fit yogurt, oatmeal, fruit, eggs, [place] bacon).  Measure your cereal.  Eat no more than one cup a day. (ie Kashi)  Eat an apple a day.  Add a vegetable a day.  Try a new vegetable a month.  Use Pam! Stop using oil or butter to cook.  Don't finish your plate or use smaller plates.  Share your dessert.  Eat sugar free Jello for dessert or frozen grapes.  Don't eat 2-3 hours before bed.  Switch to whole wheat bread, pasta, and brown rice.  Make healthier choices when you eat out. No fries!  Pick baked chicken, NOT fried.  Don't forget to SLOW DOWN when you eat. It is not going anywhere.   Take the stairs.  Park far away in the parking lot  Lift soup cans (or weights) for 10 minutes while watching TV.  Walk at work for 10 minutes during break.  Walk outside 1 time a week with your friend, kids, dog, or significant other.  Start a walking group at church.  Walk the mall as much as you can tolerate.   Keep a food diary.  Weigh yourself daily.  Walk for 15 minutes 3 days per week.  Cook at home more often and eat out less. If life happens and you go back to old habits, it is okay.  Just start over. You can do it!  If you experience chest pain, get short of breath, or tired during the exercise, please stop immediately and inform your doctor.    Before you even begin to attack a weight-loss plan, it pays to remember this: You are not  fat. You have fat. Losing weight isn't about blame or shame; it's simply another achievement to accomplish. Dieting is like any other skill-you have to buckle down and work at it. As long as you act in a smart, reasonable way, you'll ultimately get where you want to be. Here are some weight loss pearls for you.   1. It's Not a Diet. It's a Lifestyle Thinking of a diet as something you're on and suffering through only for the short term doesn't work. To shed weight and keep it off, you need to make permanent changes to the way you eat. It's OK to indulge occasionally, of course, but if you cut calories temporarily and then revert to your old way of eating, you'll gain back the weight quicker than you can say yo-yo. Use it to lose it. Research shows that one of the best predictors of long-term weight loss is how many pounds you drop in the first month. For that reason, nutritionists often suggest being stricter for the first two weeks of your new eating strategy to build momentum. Cut out added sugar and alcohol and avoid unrefined carbs. After that, figure out how you can reincorporate them in a way that's healthy and maintainable.  2. There's a Right Way to Exercise Working out burns calories and fat and boosts your metabolism by building muscle. But those trying to lose weight are notorious for overestimating the number of calories they burn and underestimating the amount they take in. Unfortunately, your system is biologically programmed to hold on to extra pounds and that means when you start exercising, your body senses the deficit and ramps up its hunger signals. If you're not diligent, you'll eat everything you burn and then some. Use it, to lose it. Cardio gets all the exercise glory, but strength and interval training are the real heroes. They help you build lean muscle, which in turn increases your metabolism and calorie-burning ability 3. Don't Overreact to Mild Hunger Some people have a hard time  losing weight because of hunger anxiety. To them, being hungry is bad-something to be avoided at all costs-so they carry snacks with them and eat when they don't need to. Others eat because they're stressed out or bored. While you never want to get to the point of being ravenous (that's when bingeing is likely to happen), a hunger pang, a craving, or the fact that it's 3:00 p.m. should not send you racing for the vending machine or obsessing about the energy bar in your purse. Ideally, you should put off eating until your stomach is growling and it's difficult to concentrate.  Use it to lose it. When you feel the urge to eat, use the HALT method. Ask yourself, Am I really hungry? Or am I angry or anxious, lonely or bored, or tired? If you're still not certain, try the apple test. If you're truly hungry, an apple should seem delicious; if it doesn't, something else is going on. Or you can try drinking water and making yourself busy, if  you are still hungry try a healthy snack.  4. Not All Calories Are Created Equal The mechanics of weight loss are pretty simple: Take in fewer calories than you use for energy. But the kind of food you eat makes all the difference. Processed food that's high in saturated fat and refined starch or sugar can cause inflammation that disrupts the hormone signals that tell your brain you're full. The result: You eat a lot more.  Use it to lose it. Clean up your diet. Swap in whole, unprocessed foods, including vegetables, lean protein, and healthy fats that will fill you up and give you the biggest nutritional bang for your calorie buck. In a few weeks, as your brain starts receiving regular hunger and fullness signals once again, you'll notice that you feel less hungry overall and naturally start cutting back on the amount you eat.  5. Protein, Produce, and Plant-Based Fats Are Your Weight-Loss Trinity Here's why eating the three Ps regularly will help you drop pounds. Protein fills  you up. You need it to build lean muscle, which keeps your metabolism humming so that you can torch more fat. People in a weight-loss program who ate double the recommended daily allowance for protein (about 110 grams for a 150-pound woman) lost 70 percent of their weight from fat, while people who ate the RDA lost only about 40 percent, one study found. Produce is packed with filling fiber. "It's very difficult to consume too many calories if you're eating a lot of vegetables. Example: Three cups of broccoli is a lot of food, yet only 93 calories. (Fruit is another story. It can be easy to overeat and can contain a lot of calories from sugar, so be sure to monitor your intake.) Plant-based fats like olive oil and those in avocados and nuts are healthy and extra satiating.  Use it to lose it. Aim to incorporate each of the three Ps into every meal and snack. People who eat protein throughout the day are able to keep weight off, according to a study in the [place] of Clinical Nutrition. In addition to meat, poultry and seafood, good sources are beans, lentils, eggs, tofu, and yogurt. As for fat, keep portion sizes in check by measuring out salad dressing, oil, and nut butters (shoot for one to two tablespoons). Finally, eat veggies or a little fruit at every meal. People who did that consumed 308 fewer calories but didn't feel any hungrier than when they didn't eat more produce.  7. How You Eat Is As Important As What You Eat In order for your brain to register that you're full, you need to focus on what you're eating. Sit down whenever you eat, preferably at a table. Turn off the TV or computer, put down your phone, and look at your food. Smell it. Chew slowly, and don't put another bite on your fork until you swallow. When women ate lunch this attentively, they consumed 30 percent less when snacking later than those who listened to an audiobook at lunchtime, according to a study in the [place] of Nutrition. 8. Weighing Yourself Really Works The scale provides the best evidence about whether your efforts are paying off. Seeing the numbers tick up or down or stagnate is motivation to keep going-or to rethink your approach. A 2015 study at [facility] found that daily weigh-ins helped people lose more weight, keep it off, and maintain that loss, even after two years. Use it to lose it. Step on  the scale at the same time every day for the best results. If your weight shoots up several pounds from one weigh-in to the next, don't freak out. Eating a lot of salt the night before or having your period is the likely culprit. The number should return to normal in a day or two. It's a steady climb that you need to do something about. 9. Too Much Stress and Too Little Sleep Are Your Enemies When you're tired and frazzled, your body cranks up the production of cortisol, the stress hormone that can cause carb cravings. Not getting enough sleep also boosts your levels of ghrelin, a hormone associated with hunger, while suppressing leptin, a hormone that signals fullness and satiety. People on a diet who slept only five and a half hours a night for two weeks lost 55 percent less fat and were hungrier than those who slept eight and a half hours, according to a study in the [place]. Use it to lose it. Prioritize sleep, aiming for seven hours or more a night, which research shows helps lower stress. And make sure you're getting quality zzz's. If a snoring spouse or a fidgety cat wakes you up frequently throughout the night, you may end up getting the equivalent of just four hours of sleep, according to a study from [facility]. Keep pets out of the bedroom, and use a white-noise app to drown out snoring. 10. You Will Hit a plateau-And You Can Bust Through It As you slim down, your body releases much less leptin, the fullness hormone.  If you're not strength  training, start right now. Building muscle can raise your metabolism to help you overcome a plateau. To keep your body challenged and burning calories, incorporate new moves and more intense intervals into your workouts or add another sweat session to your weekly routine. Alternatively, cut an extra 100 calories or so a day from your diet. Now that you've lost weight, your body simply doesn't need as much fuel.      Since food equals calories, in order to lose weight you must either eat fewer calories, exercise more to burn off calories with activity, or both. Food that is not used to fuel the body is stored as fat. A major component of losing weight is to make smarter food choices. Here's how:  1)   Limit non-nutritious foods, such as: Sugar, honey, syrups and candy Pastries, donuts, pies, cakes and cookies Soft drinks, sweetened juices and alcoholic beverages  2)  Cut down on high-fat foods by: - Choosing poultry, fish or lean red meat - Choosing low-fat cooking methods, such as baking, broiling, steaming, grilling and boiling - Using low-fat or non-fat dairy products - Using vinaigrette, herbs, lemon or fat-free salad dressings - Avoiding fatty meats, such as bacon, sausage, franks, ribs and luncheon meats - Avoiding high-fat snacks like nuts, chips and chocolate - Avoiding fried foods - Using less butter, margarine, oil and mayonnaise - Avoiding high-fat gravies, cream sauces and cream-based soups  3) Eat a variety of foods, including: - Fruit and vegetables that are raw, steamed or baked - Whole grains, breads, cereal, rice and pasta - Dairy products, such as low-fat or non-fat milk or yogurt, low-fat cottage cheese and low-fat cheese - Protein-rich foods like chicken, [place], fish, lean meat and legumes, or beans  4) Change your eating habits by: - Eat three balanced meals a day to help control your hunger - Watch portion sizes and eat small  servings of a variety of foods -  Choose low-calorie snacks - Eat only when you are hungry and stop when you are satisfied - Eat slowly and try not to perform other tasks while eating - Find other activities to distract you from food, such as walking, taking up a hobby or being involved in the community - Include regular exercise in your daily routine ( minimum of 20 min of moderate-intensity exercise at least 5 days/week)  - Find a support group, if necessary, for emotional support in your weight loss journey         Easy ways to cut 100 calories   1. Eat your eggs with hot sauce OR salsa instead of cheese.  Eggs are great for breakfast, but many people consider eggs and cheese to be BFFs. Instead of cheese-1 oz. of cheddar has 114 calories-top your eggs with hot sauce, which contains no calories and helps with satiety and metabolism. Salsa is also a great option!!  2. Top your toast, waffles or pancakes with fresh berries instead of jelly or syrup. Half a cup of berries-fresh, frozen or thawed-has about 40 calories, compared with 2 tbsp. of maple syrup or jelly, which both have about 100 calories. The berries will also give you a good punch of fiber, which helps keep you full and satisfied and won't spike blood sugar quickly like the jelly or syrup. 3. Swap the non-fat latte for black coffee with a splash of half-and-half. Contrary to its name, that non-fat latte has 130 calories and a startling 19g of carbohydrates per 16 oz. serving. Replacing that 'light' drinkable dessert with a black coffee with a splash of half-and-half saves you more than 100 calories per 16 oz. serving. 4. Sprinkle salads with freeze-dried raspberries instead of dried cranberries. If you want a sweet addition to your nutritious salad, stay away from dried cranberries. They have a whopping 130 calories per  cup and 30g carbohydrates. Instead, sprinkle freeze-dried raspberries guilt-free and save more than 100 calories per  cup serving, adding 3g of  belly-filling fiber. 5. Go for mustard in place of mayo on your sandwich. Mustard can add really nice flavor to any sandwich, and there are tons of varieties, from spicy to honey. A serving of mayo is 95 calories, versus 10 calories in a serving of mustard.  Or try an avocado mayo spread: You can find the recipe few click this link: https://www.californiaavocado.com/recipes/recipe-container/[place]-avocado-mayo 6. Choose a DIY salad dressing instead of the store-bought kind. Mix Dijon or whole grain mustard with low-fat Kefir or red wine vinegar and garlic. 7. Use hummus as a spread instead of a dip. Use hummus as a spread on a high-fiber cracker or tortilla with a sandwich and save on calories without sacrificing taste. 8. Pick just one salad "accessory." Salad isn't automatically a calorie winner. It's easy to over-accessorize with toppings. Instead of topping your salad with nuts, avocado and cranberries (all three will clock in at 313 calories), just pick one. The next day, choose a different accessory, which will also keep your salad interesting. You don't wear all your jewelry every day, right? 9. Ditch the white pasta in favor of spaghetti squash. One cup of cooked spaghetti squash has about 40 calories, compared with traditional spaghetti, which comes with more than 200. Spaghetti squash is also nutrient-dense. It's a good source of fiber and Vitamins A and C, and it can be eaten just like you would eat pasta-with a great tomato sauce and [place] meatballs or  with pesto, tofu and spinach, for example. 10. Dress up your chili, soups and stews with non-fat [place] yogurt instead of sour cream. Just a 'dollop' of sour cream can set you back 115 calories and a whopping 12g of fat-seven of which are of the artery-clogging variety. Added bonus: [place] yogurt is packed with muscle-building protein, calcium and B Vitamins. 11. Mash cauliflower instead of mashed potatoes. One cup of traditional mashed  potatoes-in all their creamy goodness-has more than 200 calories, compared to mashed cauliflower, which you can typically eat for less than 100 calories per 1 cup serving. Cauliflower is a great source of the antioxidant indole-3-carbinol (I3C), which may help reduce the risk of some cancers, like breast cancer. 12. Ditch the ice cream sundae in favor of a [place] yogurt parfait. Instead of a cup of ice cream or fro-yo for dessert, try 1 cup of nonfat Greek yogurt topped with fresh berries and a sprinkle of cacao nibs. Both toppings are packed with antioxidants, which can help reduce cellular inflammation and oxidative damage. And the comparison is a no-brainer: One cup of ice cream has about 275 calories; one cup of frozen yogurt has about 230; and a cup of Greek yogurt has just 130, plus twice the protein, so you're less likely to return to the freezer for a second helping. 13. Put olive oil in a spray container instead of using it directly from the bottle. Each tablespoon of olive oil is 120 calories and 15g of fat. Use a mister instead of pouring it straight into the pan or onto a salad. This allows for portion control and will save you more than 100 calories. 14. When baking, substitute canned pumpkin for butter or oil. Canned pumpkin-not pumpkin pie mix-is loaded with Vitamin A, which is important for skin and eye health, as well as immunity. And the comparisons are pretty crazy:  cup of canned pumpkin has about 40 calories, compared to butter or oil, which has more than 800 calories. Yes, 800 calories. Applesauce and mashed banana can also serve as good substitutions for butter or oil, usually in a 1:1 ratio. 15. Top casseroles with high-fiber cereal instead of breadcrumbs. Breadcrumbs are typically made with white bread, while breakfast cereals contain 5-9g of fiber per serving. Not only will you save more than 150 calories per  cup serving, the swap will also keep you more full and you'll get a  metabolism boost from the added fiber. 16. Snack on pistachios instead of macadamia nuts. Believe it or not, you get the same amount of calories from 35 pistachios (100 calories) as you would from only five macadamia nuts. 17. Chow down on kale chips rather than potato chips. This is my favorite 'don't knock it 'till you try it' swap. Kale chips are so easy to make at home, and you can spice them up with a little grated parmesan or chili powder. Plus, they're a mere fraction of the calories of potato chips, but with the same crunch factor we crave so often. 18. Add seltzer and some fruit slices to your cocktail instead of soda or fruit juice. One cup of soda or fruit juice can pack on as much as 140 calories. Instead, use seltzer and fruit slices. The fruit provides valuable phytochemicals, such as flavonoids and anthocyanins, which help to combat cancer and stave off the aging process.

## 2017-03-19 NOTE — Telephone Encounter (Signed)
Please result pt's recent A1c so that I can close the chart.     Thank you very much.  It was 7.3

## 2017-03-19 NOTE — Assessment & Plan Note (Signed)
Discussion had and hadnouts given to pt  Pre-comtemplative stages- not ready  Info given

## 2017-03-19 NOTE — Assessment & Plan Note (Signed)
Goal BP's r/w pt   rec walking on a forgiving surface such as a track  Low salt

## 2017-03-19 NOTE — Assessment & Plan Note (Signed)
Pt's A1c is at goal-- 7.3 today.  Handouts given after counseling done re: goals of A1c  7.5- 8.0  Diet and lifesytle counsleing

## 2017-03-19 NOTE — Assessment & Plan Note (Signed)
Stable, well adjusted today, appropriate

## 2017-03-19 NOTE — Assessment & Plan Note (Signed)
5-65min counseling given

## 2017-03-19 NOTE — Assessment & Plan Note (Signed)
long h/o signficant depression including SI

## 2017-03-20 LAB — POCT GLYCOSYLATED HEMOGLOBIN (HGB A1C): HEMOGLOBIN A1C: 7.3

## 2017-03-23 DIAGNOSIS — G894 Chronic pain syndrome: Secondary | ICD-10-CM | POA: Diagnosis not present

## 2017-03-26 DIAGNOSIS — L03317 Cellulitis of buttock: Secondary | ICD-10-CM | POA: Diagnosis not present

## 2017-04-01 ENCOUNTER — Encounter: Payer: Self-pay | Admitting: Family Medicine

## 2018-02-24 LAB — HEMOGLOBIN A1C: Hemoglobin A1C: 6.7

## 2018-05-20 LAB — LIPID PANEL
Cholesterol: 307 — AB (ref 0–200)
HDL: 1 — AB (ref 35–70)
LDL Cholesterol: 193
Triglycerides: 379 — AB (ref 40–160)

## 2018-05-20 LAB — HEPATIC FUNCTION PANEL
ALT: 13 (ref 7–35)
AST: 15 (ref 13–35)
Alkaline Phosphatase: 76 (ref 25–125)
Bilirubin, Total: 0.5

## 2018-05-20 LAB — BASIC METABOLIC PANEL
BUN: 19 (ref 4–21)
Creatinine: 1.3 — AB (ref 0.5–1.1)
Glucose: 142
Potassium: 4.1 (ref 3.4–5.3)
Sodium: 136 — AB (ref 137–147)

## 2018-05-20 LAB — CBC AND DIFFERENTIAL
HCT: 45 (ref 36–46)
Hemoglobin: 14.9 (ref 12.0–16.0)
Platelets: 304 (ref 150–399)
WBC: 9.9

## 2018-05-20 LAB — HEMOGLOBIN A1C: Hemoglobin A1C: 7.2

## 2018-09-09 LAB — HEMOGLOBIN A1C: Hemoglobin A1C: 7.4

## 2018-11-10 ENCOUNTER — Ambulatory Visit
Admission: RE | Admit: 2018-11-10 | Discharge: 2018-11-10 | Disposition: A | Discharge status: Home / Self Care | Payer: Medicare Other | Source: Ambulatory Visit | Attending: Family Medicine | Admitting: Family Medicine

## 2018-11-10 ENCOUNTER — Other Ambulatory Visit: Payer: Self-pay | Admitting: Family Medicine

## 2018-11-10 DIAGNOSIS — R0602 Shortness of breath: Secondary | ICD-10-CM

## 2018-11-15 LAB — BASIC METABOLIC PANEL
BUN: 25 — AB (ref 4–21)
Creatinine: 1.5 — AB (ref 0.5–1.1)
Glucose: 154
Potassium: 4.5 (ref 3.4–5.3)
Sodium: 137 (ref 137–147)

## 2018-11-15 LAB — HEPATIC FUNCTION PANEL
ALT: 16 (ref 7–35)
AST: 35 (ref 13–35)
Alkaline Phosphatase: 77 (ref 25–125)
Bilirubin, Total: 0.5

## 2019-04-28 ENCOUNTER — Ambulatory Visit (INDEPENDENT_AMBULATORY_CARE_PROVIDER_SITE_OTHER): Payer: Medicare Other | Admitting: Family Medicine

## 2019-04-28 ENCOUNTER — Encounter: Payer: Self-pay | Admitting: Family Medicine

## 2019-04-28 ENCOUNTER — Other Ambulatory Visit: Payer: Self-pay

## 2019-04-28 VITALS — BP 129/78 | HR 86 | Temp 98.0°F | Ht <= 58 in | Wt 140.9 lb

## 2019-04-28 DIAGNOSIS — E1159 Type 2 diabetes mellitus with other circulatory complications: Secondary | ICD-10-CM | POA: Diagnosis not present

## 2019-04-28 DIAGNOSIS — E785 Hyperlipidemia, unspecified: Secondary | ICD-10-CM

## 2019-04-28 DIAGNOSIS — E1169 Type 2 diabetes mellitus with other specified complication: Secondary | ICD-10-CM | POA: Diagnosis not present

## 2019-04-28 DIAGNOSIS — N183 Chronic kidney disease, stage 3 unspecified: Secondary | ICD-10-CM | POA: Insufficient documentation

## 2019-04-28 DIAGNOSIS — F321 Major depressive disorder, single episode, moderate: Secondary | ICD-10-CM

## 2019-04-28 DIAGNOSIS — G8929 Other chronic pain: Secondary | ICD-10-CM

## 2019-04-28 DIAGNOSIS — E119 Type 2 diabetes mellitus without complications: Secondary | ICD-10-CM

## 2019-04-28 DIAGNOSIS — M549 Dorsalgia, unspecified: Secondary | ICD-10-CM

## 2019-04-28 DIAGNOSIS — G894 Chronic pain syndrome: Secondary | ICD-10-CM

## 2019-04-28 DIAGNOSIS — G4700 Insomnia, unspecified: Secondary | ICD-10-CM | POA: Insufficient documentation

## 2019-04-28 DIAGNOSIS — I10 Essential (primary) hypertension: Secondary | ICD-10-CM

## 2019-04-28 DIAGNOSIS — Z87891 Personal history of nicotine dependence: Secondary | ICD-10-CM

## 2019-04-28 DIAGNOSIS — Z79899 Other long term (current) drug therapy: Secondary | ICD-10-CM

## 2019-04-28 DIAGNOSIS — Z720 Tobacco use: Secondary | ICD-10-CM

## 2019-04-28 DIAGNOSIS — M542 Cervicalgia: Secondary | ICD-10-CM

## 2019-04-28 DIAGNOSIS — I152 Hypertension secondary to endocrine disorders: Secondary | ICD-10-CM

## 2019-04-28 NOTE — Progress Notes (Signed)
Impression and Recommendations:    1. Controlled type 2 diabetes mellitus without complication, without long-term current use of insulin ([place])   2. Hypertension associated with diabetes ([place])   3. Hyperlipidemia associated with type 2 diabetes mellitus ([name])   4. Stage III chronic kidney disease ([place])   5. Chronic pain syndrome   6. Chronic neck pain   7. Chronic back pain, unspecified back location, unspecified back pain laterality   8. Depression, major, single episode, moderate ([place])   9. Insomnia, unspecified type   10. High risk medication use   11. Tobacco abuse-current use   12. more than 50-pack-year history      Controlled type 2 diabetes mellitus without complication, without long-term current use of insulin ([place]) - Plan:  Well controlled for age - Counseled patient on pathophysiology of disease and discussed various treatment options, which often includes dietary and lifestyle modifications as first line.  Importance of low carb/ketogenic diet discussed with patient in addition to regular exercise.   - Check FBS and 2 hours after the biggest meal of your day.  Keep log and bring in next OV for my review.   Also, if you ever feel poorly, please check your blood pressure and blood sugar, as one or the other could be the cause of your symptoms.  - Being a diabetic, you need yearly eye and foot exams. Make appt.for diabetic eye exam   - Continue current treatment plan.     Also, risks and benefits of medications discussed with patient, including alternative treatments.   Encouraged patient to read drug information handouts to further educate self about the medicine prior to starting it. Contact us prior with any questions/ concerns.   Hypertension associated with diabetes ([place]) - Plan:  -Controlled, reminded patient importance of constant monitoring at home etc.  - Lifestyle changes such as dash diet and engaging in a regular exercise program discussed with patient.   Educational handouts provided  Ambulatory BP monitoring encouraged. Keep log and bring in next OV  Continue current medication(s).   Hyperlipidemia associated with type 2 diabetes mellitus ([place]) - Plan:  -Dietary changes such as low saturated & trans fat and low carb/ ketogenic diets discussed with patient.  Encouraged regular exercise and weight loss when appropriate.   Educational handouts provided at patient's desire.  Continue current medication(s).      Stage III chronic kidney disease ([place]) - Plan: Stable, will continue to monitor    Chronic pain syndrome - Plan: Pain clinic referral-patient understands I cannot treat her chronic pain and we our practice that is not set up to do so here.  She understands she cannot get any chronic narcotics through [place].  Chronic neck pain - Plan: Ambulatory referral to Pain Clinic,  Chronic back pain, unspecified back location, unspecified back pain laterality - Plan: Ambulatory referral to Pain Clinic,   Depression, major, single episode, moderate ([place]) - Plan: Ambulatory referral to Psychiatry,  -Especially with patient's history and complicated medication history, patient understands this will need to be monitored and managed by a psychiatrist for her mood, sleep, etc.   Insomnia, unspecified type - Plan: Ambulatory referral to Psychiatry,   High risk medication use - Plan: Ambulatory referral to Psychiatry   Tobacco abuse-current use - Plan: - Told pt to think seriously about quitting smoking!  Told pt it is very important for his/her health and well being.    - Smoking cessation instruction/counseling given:  counseled patient on  the dangers of tobacco use, advised patient to stop smoking, and reviewed strategies to maximize success  - Discussed with patient that there are multiple treatments to aid in quitting smoking, however I explained none will work unless pt really want to quit  - Told to call 1-800-QUIT-NOW (865) 071-1022)  for free smoking cessation counseling and support, or pt can go online to www.heart.org - the American Heart Association website and search "quit smoking ".    more than 50-pack-year history - patient understands the significant risks associated with this.   Orders Placed This Encounter  Procedures  . Ambulatory referral to Pain Clinic  . Ambulatory referral to Psychiatry   Gross side effects, risk and benefits, and alternatives of medications and treatment plan in general discussed with patient.  Patient is aware that all medications have potential side effects and we are unable to predict every side effect or drug-drug interaction that may occur.   Patient will call with any questions prior to using medication if they have concerns.    Expresses verbal understanding and consents to current therapy and treatment regimen.  No barriers to understanding were identified.  Red flag symptoms and signs discussed in detail.  Patient expressed understanding regarding what to do in case of emergency\urgent symptoms  Please see AVS handed out to patient at the end of our visit for further patient instructions/ counseling done pertaining to today's office visit.   Return for DM, HTN, HLD follow up every 34mo; also needs FBW next OV.     Note:  This note was prepared with assistance of Dragon voice recognition software. Occasional wrong-word or sound-a-like substitutions may have occurred due to the inherent limitations of voice recognition software.  Marjory Sneddon, DO 04/28/2019 8:32 PM   --------------------------------------------------------------------------------------------------------------------------------------------------------------------------------------------------------------------------------------------    Subjective:     HPI: Alexis Mclean is a 73 y.o. female who presents to [place] [facility] today for issues as discussed below.   Patient was  only seen 1 time as a new patient on 03/19/2017.  At that time she told me she was not going to transfer her care to [place].  Please see reasons below:  "- her current PCP gives her narcotic pain meds for chronic back pain.  She doesn't want to drive into [place] for specialist.  She has failed intrathecal pain pump, she was sent to Pain mgt and they would not give her her narcotics/ said nothing else they could do for her."  "Told pt that due to her multiple allergies and need for narcotic mgt; if she does not want to be referred to a ortho spine or interventional pain doc for mgt of her pain, then she would have to stay with her current PCP who gives her these meds, as I would not be able to do so.   Explained my limitations as a family doc and that I do not tx chronic pain as she needs.   - pt understood completely and was appreciative of my explaination.  - She decided to keep with her current PCP and NOT tx care to [place]." -----------------------------------------------------------------------------------------------------------  -Today patient presents to the office as she now has decided to transfer her care to [place].     -Chronic neck/back pain management:  - [facility] in [place]-  Would like to see chronic pain mgt docs/ providers there.   -Diabetes:   A1c was 7.3 over 2 years ago.   Diet controlled.   A1c is  7.1.  She checks BS- 130's- 140's fasting - checks it daily.  Occ PP- less 180.     --HTN:   132/84,  130/75, checks it daily,  131/75 pulse-    -- HLD:  No meds- they all make her pain worse.     -- Smoking 1 pACK OR LESS DAILY NOW.   Smoking since 73 yo.     -Mood:  Patient was admitted to [facility] due to major depression back in 05/10/2014.     - Insomnia: trouble sleeping due to narcotics.  Needs to use clonazepam for sleep.        Depression screen [facility] 2/9 05/10/2019 04/28/2019 03/19/2017  Decreased Interest 0 2 3  Down, Depressed, Hopeless 0 2 1  PHQ - 2  Score 0 4 4  Altered sleeping 0 3 2  Tired, decreased energy 0 3 2  Change in appetite 0 3 3  Feeling bad or failure about yourself  0 1 2  Trouble concentrating 0 1 1  Moving slowly or fidgety/restless 0 2 2  Suicidal thoughts 0 - 0  PHQ-9 Score 0 17 16  Difficult doing work/chores Not difficult at all - -     Wt Readings from Last 3 Encounters:  05/10/19 135 lb 11.2 oz (61.6 kg)  04/28/19 140 lb 14.4 oz (63.9 kg)  03/19/17 139 lb 11.2 oz (63.4 kg)   BP Readings from Last 3 Encounters:  05/10/19 129/78  04/28/19 129/78  03/19/17 (!) 146/82   Pulse Readings from Last 3 Encounters:  05/10/19 (!) 59  04/28/19 86  03/19/17 77   BMI Readings from Last 3 Encounters:  05/10/19 28.36 kg/m  04/28/19 29.45 kg/m  03/19/17 29.20 kg/m     Patient Care Team    Relationship Specialty Notifications Start End  Mellody Dance, DO PCP - General Family Medicine  04/20/19   Becky Sax, MD Referring Physician Orthopedic Surgery  04/28/19    Comment: shoulder     Patient Active Problem List   Diagnosis Date Noted  . Controlled type 2 diabetes mellitus without complication, without long-term current use of insulin ([place]) 03/19/2017    Priority: High  . Tobacco abuse counseling 03/19/2017    Priority: High  . Hyperlipidemia associated with type 2 diabetes mellitus ([place]) 03/19/2017    Priority: High  . Hypertension associated with diabetes ([place]) 05/09/2014    Priority: High  . Tobacco abuse 05/09/2014    Priority: High  . Major depressive disorder, recurrent episode ([place]) 07/20/2014    Priority: Medium  . Suicidal ideation 05/09/2014    Priority: Medium  . Chronic pain syndrome 05/09/2014    Priority: Medium  . Stage III chronic kidney disease ([place]) 04/28/2019  . Chronic neck pain 04/28/2019  . Depression, major, single episode, moderate ([place]) 04/28/2019  . Insomnia 04/28/2019  . Chronic back pain 04/28/2019  . High risk medication use 04/28/2019  . Neuropathic pain of  chest 03/19/2017  . Low back pain 05/09/2014  . Fracture of T8 vertebra ([place]) 12/2013    Past Medical history, Surgical history, Family history, Social history, Allergies and Medications have been entered into the medical record, reviewed and changed as needed.    Current Meds  Medication Sig  . amLODipine (NORVASC) 10 MG tablet 10 mg every evening. Takes half daily  . Calcium 600-200 MG-UNIT tablet Take 1 tablet by mouth daily.  . clonazePAM (KLONOPIN) 0.5 MG tablet 0.5 mg at bedtime.   . diclofenac sodium (VOLTAREN) 1 %  GEL APPLY TO AFFECTED AREA FOUR TIMES A DAY AS NEEDED (TRANSDERMAL)  . INCRUSE ELLIPTA 62.5 MCG/INH AEPB as needed.  . irbesartan (AVAPRO) 300 MG tablet Take 1 tablet by mouth daily.  Marland Kitchen lidocaine (XYLOCAINE) 5 % ointment Apply 1 application topically as needed.  . liver oil-zinc oxide (DESITIN) 40 % ointment Apply 1 application topically as needed for irritation.  Marland Kitchen Lysine 500 MG TABS Take by mouth daily.  Marland Kitchen MAGNESIUM PO Take 500 mg by mouth daily.  . vitamin C (ASCORBIC ACID) 500 MG tablet Take 500 mg by mouth daily.  Marland Kitchen XTAMPZA ER 13.5 MG C12A Take 1 capsule by mouth 2 (two) times a day.  . [DISCONTINUED] Coenzyme Q10 (CO Q 10 PO) Take 100 mg by mouth daily.  . [DISCONTINUED] losartan (COZAAR) 100 MG tablet Take 100 mg by mouth daily.    Allergies:  Allergies  Allergen Reactions  . Morphine And Related Other (See Comments)    hypotension  . Fluocinolone Other (See Comments)  . Latex Rash  . Neurontin [Gabapentin] Other (See Comments)    dizziness  . Penicillins Diarrhea and Other (See Comments)    Cramping in abdomen  . Sulfa Drugs Cross Reactors Nausea Only    Really sick feeling  . Xanax [Alprazolam] Other (See Comments)    insomnia  . Ivp Dye [Iodinated Diagnostic Agents]     Red/hot  . Lyrica [Pregabalin] Other (See Comments)    unknown     Review of Systems:  A fourteen system review of systems was performed and found to be positive as per HPI.    Objective:   Blood pressure 129/78, pulse 86, temperature 98 F (36.7 C), height 4\' 10"  (1.473 m), weight 140 lb 14.4 oz (63.9 kg), SpO2 98 %. Body mass index is 29.45 kg/m. General:  Well Developed, well nourished, appropriate for stated age.  Neuro:  Alert and oriented,  extra-ocular muscles intact  HEENT:  Normocephalic, atraumatic, neck supple, no carotid bruits appreciated  Skin:  no gross rash, warm, pink. Cardiac:  RRR, S1 S2 Respiratory:  ECTA B/L and A/P, Not using accessory muscles, speaking in full sentences- unlabored. Vascular:  Ext warm, no cyanosis apprec.; cap RF less 2 sec. Psych:  No HI/SI, judgement and insight good, Euthymic mood. Full Affect.

## 2019-05-10 ENCOUNTER — Other Ambulatory Visit: Payer: Self-pay

## 2019-05-10 ENCOUNTER — Encounter: Payer: Self-pay | Admitting: Family Medicine

## 2019-05-10 ENCOUNTER — Ambulatory Visit (INDEPENDENT_AMBULATORY_CARE_PROVIDER_SITE_OTHER): Payer: Medicare Other | Admitting: Family Medicine

## 2019-05-10 VITALS — BP 129/78 | HR 59 | Temp 98.2°F | Ht <= 58 in | Wt 135.7 lb

## 2019-05-10 DIAGNOSIS — R350 Frequency of micturition: Secondary | ICD-10-CM | POA: Diagnosis not present

## 2019-05-10 LAB — POCT URINALYSIS DIPSTICK
Bilirubin, UA: NEGATIVE
Glucose, UA: NEGATIVE
Ketones, UA: NEGATIVE
Nitrite, UA: POSITIVE
Protein, UA: NEGATIVE
Spec Grav, UA: 1.02 (ref 1.010–1.025)
Urobilinogen, UA: 0.2 E.U./dL
pH, UA: 6 (ref 5.0–8.0)

## 2019-05-10 MED ORDER — NITROFURANTOIN MONOHYD MACRO 100 MG PO CAPS: 100.0000 mg | ORAL_CAPSULE | Freq: Two times a day (BID) | ORAL | 0 refills | Status: AC

## 2019-05-10 NOTE — Progress Notes (Signed)
Impression and Recommendations:    1. Urinary frequency     1. Urinary frequency - POCT urinalysis dipstick - Urine Culture  - abx given - change if needed re: CX and Sensitivities   Education and routine counseling performed. Handouts provided.  Allergies  Allergen Reactions  . Morphine And Related Other (See Comments)    hypotension  . Fluocinolone Other (See Comments)  . Latex Rash  . Neurontin [Gabapentin] Other (See Comments)    dizziness  . Penicillins Diarrhea and Other (See Comments)    Cramping in abdomen  . Sulfa Drugs Cross Reactors Nausea Only    Really sick feeling  . Xanax [Alprazolam] Other (See Comments)    insomnia  . Ivp Dye [Iodinated Diagnostic Agents]     Red/hot  . Lyrica [Pregabalin] Other (See Comments)    unknown    Orders Placed This Encounter  Procedures  . Urine Culture  . POCT urinalysis dipstick    Medications Discontinued During This Encounter  Medication Reason  . Coenzyme Q10 (CO Q 10 PO) Patient Preference  . losartan (COZAAR) 100 MG tablet Change in therapy     Meds ordered this encounter  Medications  . nitrofurantoin, macrocrystal-monohydrate, (MACROBID) 100 MG capsule    Sig: Take 1 capsule (100 mg total) by mouth 2 (two) times daily for 7 days.    Dispense:  14 capsule    Refill:  0    The patient was counseled, risk factors were discussed, anticipatory guidance given.  Gross side effects, risk and benefits, and alternatives of medications discussed with patient.  Patient is aware that all medications have potential side effects and we are unable to predict every side effect or drug-drug interaction that may occur.  Expresses verbal understanding and consents to current therapy plan and treatment regimen.   Return if symptoms worsen or fail to improve, for Patient was told at [place] 04/28/19 to follow-up 3 months-will  need FBW.   Mellody Dance 05/10/19 10:09 PM     Subjective:    HPI: Alexis Mclean is a 73 y.o. female who presents to [place] at [facility] today for c/o urinary frequency  Sx for 3-5 days.     C/O: Mild lower pressure / SPT   Denies: Fever chills, nausea vomiting, back pain-that is new, denies any dysuria or hematuria.  Feels like urinary tract infections she has had in the past.   Urinalysis    Component Value Date/Time   COLORURINE YELLOW 05/09/2014 0008   APPEARANCEUR CLOUDY (A) 05/09/2014 0008   LABSPEC 1.018 05/09/2014 0008   PHURINE 5.5 05/09/2014 0008   GLUCOSEU NEGATIVE 05/09/2014 0008   HGBUR NEGATIVE 05/09/2014 0008   BILIRUBINUR negative 05/10/2019 1157   KETONESUR NEGATIVE 05/09/2014 0008   PROTEINUR Negative 05/10/2019 1157   PROTEINUR NEGATIVE 05/09/2014 0008   UROBILINOGEN 0.2 05/10/2019 1157   UROBILINOGEN 0.2 05/09/2014 0008   NITRITE positive 05/10/2019 1157   NITRITE NEGATIVE 05/09/2014 0008   LEUKOCYTESUR Small (1+) (A) 05/10/2019 1157    Wt Readings from Last 3 Encounters:  05/10/19 135 lb 11.2 oz (61.6 kg)  04/28/19 140 lb 14.4 oz (63.9 kg)  03/19/17 139 lb 11.2 oz (63.4 kg)   BP Readings from Last 3 Encounters:  05/10/19 129/78  04/28/19 129/78  03/19/17 (!) 146/82   Pulse Readings from Last 3 Encounters:  05/10/19 (!) 59  04/28/19 86  03/19/17 77   BMI Readings from Last  3 Encounters:  05/10/19 28.36 kg/m  04/28/19 29.45 kg/m  03/19/17 29.20 kg/m     Patient Active Problem List   Diagnosis Date Noted  . Controlled type 2 diabetes mellitus without complication, without long-term current use of insulin ([place]) 03/19/2017    Priority: High  . Tobacco abuse counseling 03/19/2017    Priority: High  . Hyperlipidemia associated with type 2 diabetes mellitus ([place]) 03/19/2017    Priority: High  . Hypertension associated with diabetes ([place]) 05/09/2014    Priority: High  . Tobacco abuse 05/09/2014    Priority: High  . Major depressive disorder, recurrent episode ([place]) 07/20/2014    Priority:  Medium  . Suicidal ideation 05/09/2014    Priority: Medium  . Chronic pain syndrome 05/09/2014    Priority: Medium  . Stage III chronic kidney disease ([place]) 04/28/2019  . Chronic neck pain 04/28/2019  . Depression, major, single episode, moderate ([place]) 04/28/2019  . Insomnia 04/28/2019  . Chronic back pain 04/28/2019  . High risk medication use 04/28/2019  . Neuropathic pain of chest 03/19/2017  . Low back pain 05/09/2014  . Fracture of T8 vertebra ([place]) 12/2013    Past Surgical History:  Procedure Laterality Date  . ABDOMINAL HYSTERECTOMY    . APPENDECTOMY    . BACK SURGERY  03/2013   fusion  . BLADDER REPAIR    . salpingoorrphorectomy Right   . TONSILLECTOMY    . WISDOM TOOTH EXTRACTION      Family History  Problem Relation Age of Onset  . Stroke Mother   . Diabetes Mother   . Depression Mother   . Diabetes Father   . Emphysema Father   . Cancer Father        Lung  . Asthma Father   . Diabetes Sister   . Diabetes Brother     Social History   Substance and Sexual Activity  Drug Use No  ,  Social History   Substance and Sexual Activity  Alcohol Use No  ,  Social History   Tobacco Use  Smoking Status Current Every Day Smoker  . Packs/day: 1.33  . Years: 30.00  . Pack years: 39.90  . Types: Cigarettes  . Last attempt to quit: 03/26/2014  . Years since quitting: 5.1  Smokeless Tobacco Never Used  ,  Social History   Substance and Sexual Activity  Sexual Activity Not on file    Patient's Medications  New Prescriptions   NITROFURANTOIN, MACROCRYSTAL-MONOHYDRATE, (MACROBID) 100 MG CAPSULE    Take 1 capsule (100 mg total) by mouth 2 (two) times daily for 7 days.  Previous Medications   AMLODIPINE (NORVASC) 10 MG TABLET    10 mg every evening. Takes half daily   CALCIUM 600-200 MG-UNIT TABLET    Take 1 tablet by mouth daily.   CLONAZEPAM (KLONOPIN) 0.5 MG TABLET    0.5 mg at bedtime.    DICLOFENAC SODIUM (VOLTAREN) 1 % GEL    APPLY TO AFFECTED AREA  FOUR TIMES A DAY AS NEEDED (TRANSDERMAL)   INCRUSE ELLIPTA 62.5 MCG/INH AEPB    as needed.   IRBESARTAN (AVAPRO) 300 MG TABLET    Take 1 tablet by mouth daily.   LIDOCAINE (XYLOCAINE) 5 % OINTMENT    Apply 1 application topically as needed.   LIVER OIL-ZINC OXIDE (DESITIN) 40 % OINTMENT    Apply 1 application topically as needed for irritation.   LYSINE 500 MG TABS    Take by mouth daily.   MAGNESIUM PO  Take 500 mg by mouth daily.   VITAMIN C (ASCORBIC ACID) 500 MG TABLET    Take 500 mg by mouth daily.   XTAMPZA ER 13.5 MG C12A    Take 1 capsule by mouth 2 (two) times a day.  Modified Medications   No medications on file  Discontinued Medications   COENZYME Q10 (CO Q 10 PO)    Take 100 mg by mouth daily.   LOSARTAN (COZAAR) 100 MG TABLET    Take 100 mg by mouth daily.    Morphine and related, Fluocinolone, Latex, Neurontin [gabapentin], Penicillins, Sulfa drugs cross reactors, Xanax [alprazolam], Ivp dye [iodinated diagnostic agents], and Lyrica [pregabalin]  Current Meds  Medication Sig  . amLODipine (NORVASC) 10 MG tablet 10 mg every evening. Takes half daily  . Calcium 600-200 MG-UNIT tablet Take 1 tablet by mouth daily.  . clonazePAM (KLONOPIN) 0.5 MG tablet 0.5 mg at bedtime.   . diclofenac sodium (VOLTAREN) 1 % GEL APPLY TO AFFECTED AREA FOUR TIMES A DAY AS NEEDED (TRANSDERMAL)  . INCRUSE ELLIPTA 62.5 MCG/INH AEPB as needed.  . irbesartan (AVAPRO) 300 MG tablet Take 1 tablet by mouth daily.  Marland Kitchen lidocaine (XYLOCAINE) 5 % ointment Apply 1 application topically as needed.  . liver oil-zinc oxide (DESITIN) 40 % ointment Apply 1 application topically as needed for irritation.  Marland Kitchen Lysine 500 MG TABS Take by mouth daily.  Marland Kitchen MAGNESIUM PO Take 500 mg by mouth daily.  . vitamin C (ASCORBIC ACID) 500 MG tablet Take 500 mg by mouth daily.  Marland Kitchen XTAMPZA ER 13.5 MG C12A Take 1 capsule by mouth 2 (two) times a day.    Review of Systems: General:   No F/C, wt loss Pulm:   No DIB, pleuritic  chest pain Card:  No CP, palpitations Abd:  No n/v/d or pain GU:  Dysuria, increased frequency and urgency; no vaginal discharge Ext:  No inc edema from baseline   Objective:  Blood pressure 129/78, pulse (!) 59, temperature 98.2 F (36.8 C), height 4\' 10"  (1.473 m), weight 135 lb 11.2 oz (61.6 kg), SpO2 98 %. Body mass index is 28.36 kg/m.  General: Well Developed, well nourished, and in no acute distress.  HEENT: Normocephalic, atraumatic Skin: Warm and dry, cap RF less 2 sec, good turgor CV: +S1, S2 Respiratory: ECTA B/L; speaking in full sentences, no conversational dyspnea Abd: Soft, NT, ND, No G/R/R, no SPT, No flank pain NeuroM-Sk: Ambulates w/o assistance, moves * 4 Psych: A and O *3

## 2019-05-10 NOTE — Patient Instructions (Signed)
Pyelonephritis, Adult Pyelonephritis is an infection that occurs in the kidney. The kidneys are the organs that filter a person's blood and move waste out of the bloodstream and into the urine. Urine passes from the kidneys, through tubes called ureters, and into the bladder. There are two main types of pyelonephritis:  Infections that come on quickly without any warning (acute pyelonephritis).  Infections that last for a long period of time (chronic pyelonephritis). In most cases, the infection clears up with treatment and does not cause further problems. More severe infections or chronic infections can sometimes spread to the bloodstream or lead to other problems with the kidneys. What are the causes? This condition is usually caused by:  Bacteria traveling from the bladder up to the kidney. This may occur after having a bladder infection (cystitis) or urinary tract infection (UTI).  Bladder infections caused from bacteria traveling from the bloodstream to the kidney. What increases the risk? This condition is more likely to develop in:  Pregnant women.  Older people.  People who have any of these conditions: ? Diabetes. ? Inflammation of the prostate gland (prostatitis), in males. ? Kidney stones or bladder stones. ? Other abnormalities of the kidney or ureter. ? Cancer.  People who have a catheter placed in the bladder.  People who are sexually active.  Women who use spermicides.  People who have had a prior UTI. What are the signs or symptoms? Symptoms of this condition include:  Frequent urination.  Strong or persistent urge to urinate.  Burning or stinging when urinating.  Abdominal pain.  Back pain.  Pain in the side or flank area.  Fever or chills.  Blood in the urine, or dark urine.  Nausea or vomiting. How is this diagnosed? This condition may be diagnosed based on:  Your medical history and a physical exam.  Urine tests.  Blood tests. You may  also have imaging tests of the kidneys, such as an ultrasound or CT scan. How is this treated? Treatment for this condition may depend on the severity of the infection.  If the infection is mild and is found early, you may be treated with antibiotic medicines taken by mouth (orally). You will need to drink fluids to remain hydrated.  If the infection is more severe, you may need to stay in the hospital and receive antibiotics given directly into a vein through an IV. You may also need to receive fluids through an IV if you are not able to remain hydrated. After your hospital stay, you may need to take oral antibiotics for a period of time. Other treatments may be required, depending on the cause of the infection. Follow these instructions at home: Medicines  Take your antibiotic medicine as told by your health care provider. Do not stop taking the antibiotic even if you start to feel better.  Take over-the-counter and prescription medicines only as told by your health care provider. General instructions   Drink enough fluid to keep your urine pale yellow.  Avoid caffeine, tea, and carbonated beverages. They tend to irritate the bladder.  Urinate often. Avoid holding in urine for long periods of time.  Urinate before and after sex.  After a bowel movement, women should cleanse from front to back. Use each tissue only once.  Keep all follow-up visits as told by your health care provider. This is important. Contact a health care provider if:  Your symptoms do not get better after 2 days of treatment.  Your symptoms get worse.  You have a fever. Get help right away if you:  Are unable to take your antibiotics or fluids.  Have shaking chills.  Vomit.  Have severe flank or back pain.  Have extreme weakness or fainting. Summary  Pyelonephritis is a urinary tract infection (UTI) that occurs in the kidney.  Treatment for this condition may depend on the severity of the  infection.  Take your antibiotic medicine as told by your health care provider. Do not stop taking the antibiotic even if you start to feel better.  Drink enough fluid to keep your urine pale yellow.  Keep all follow-up visits as told by your health care provider. This is important. This information is not intended to replace advice given to you by your health care provider. Make sure you discuss any questions you have with your health care provider. Document Released: 10/20/2005 Document Revised: 08/24/2018 Document Reviewed: 08/24/2018 Elsevier Patient Education  2020 [employer].

## 2019-05-13 LAB — URINE CULTURE

## 2019-05-16 ENCOUNTER — Ambulatory Visit (HOSPITAL_COMMUNITY): Payer: Self-pay | Admitting: Licensed Clinical Social Worker

## 2019-08-02 ENCOUNTER — Ambulatory Visit: Payer: Medicare Other | Admitting: Family Medicine

## 2019-11-03 ENCOUNTER — Ambulatory Visit: Payer: Self-pay | Admitting: Podiatry

## 2019-11-07 ENCOUNTER — Other Ambulatory Visit: Payer: Self-pay

## 2019-11-07 ENCOUNTER — Encounter: Payer: Self-pay | Admitting: Podiatry

## 2019-11-07 ENCOUNTER — Ambulatory Visit (INDEPENDENT_AMBULATORY_CARE_PROVIDER_SITE_OTHER): Payer: Medicare Other | Admitting: Podiatry

## 2019-11-07 VITALS — BP 124/73

## 2019-11-07 DIAGNOSIS — L600 Ingrowing nail: Secondary | ICD-10-CM

## 2019-11-07 MED ORDER — NEOMYCIN-POLYMYXIN-HC 3.5-10000-1 OT SOLN: OTIC | 1 refills | Status: AC

## 2019-11-07 NOTE — Patient Instructions (Signed)

## 2019-11-07 NOTE — Progress Notes (Signed)
Subjective:   Patient ID: Alexis Mclean, female   DOB: 74 y.o.   MRN: [id number]   HPI Patient presents stating the second nail on the right foot is incurvated on the border and it sore and it makes it hard for me to wear shoe gear.  Patient states that she is tried to trim it and soak it without relief and has noted some blistering around the toe nail.  Has had previous ingrown toenail removed by me years ago patient does not smoke likes to be active   Review of Systems  All other systems reviewed and are negative.       Objective:  Physical Exam Vitals and nursing note reviewed.  Constitutional:      Appearance: She is well-developed.  Pulmonary:     Effort: Pulmonary effort is normal.  Musculoskeletal:        General: Normal range of motion.  Skin:    General: Skin is warm.  Neurological:     Mental Status: She is alert.     Neurovascular status intact muscle strength found to be adequate range of motion within normal limits.  Patient is found to have incurvated right second digit medial border that is painful when pressed irritated in the corner with no active redness or drainage noted currently.  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  I explained the procedure risk and she signed consent form and today I infiltrated the right second toe 60 mg like Marcaine mixture sterile prep applied to the toe and using sterile instrumentation I remove the medial border exposed matrix applied phenol 3 applications 30 seconds followed by alcohol lavage sterile dressing.  Gave instructions on soaks and reappoint and encouraged to call with questions and wrote prescription for drops for the postoperative.

## 2020-11-16 DIAGNOSIS — M150 Primary generalized (osteo)arthritis: Secondary | ICD-10-CM | POA: Diagnosis not present

## 2020-11-16 DIAGNOSIS — E119 Type 2 diabetes mellitus without complications: Secondary | ICD-10-CM | POA: Diagnosis not present

## 2020-11-16 DIAGNOSIS — E7800 Pure hypercholesterolemia, unspecified: Secondary | ICD-10-CM | POA: Diagnosis not present

## 2020-11-16 DIAGNOSIS — E785 Hyperlipidemia, unspecified: Secondary | ICD-10-CM | POA: Diagnosis not present

## 2020-11-16 DIAGNOSIS — I10 Essential (primary) hypertension: Secondary | ICD-10-CM | POA: Diagnosis not present

## 2020-11-16 DIAGNOSIS — J449 Chronic obstructive pulmonary disease, unspecified: Secondary | ICD-10-CM | POA: Diagnosis not present

## 2020-11-16 DIAGNOSIS — N183 Chronic kidney disease, stage 3 unspecified: Secondary | ICD-10-CM | POA: Diagnosis not present

## 2020-11-16 DIAGNOSIS — M810 Age-related osteoporosis without current pathological fracture: Secondary | ICD-10-CM | POA: Diagnosis not present

## 2020-11-22 DIAGNOSIS — F5101 Primary insomnia: Secondary | ICD-10-CM | POA: Diagnosis not present

## 2020-11-22 DIAGNOSIS — J449 Chronic obstructive pulmonary disease, unspecified: Secondary | ICD-10-CM | POA: Diagnosis not present

## 2020-11-22 DIAGNOSIS — N183 Chronic kidney disease, stage 3 unspecified: Secondary | ICD-10-CM | POA: Diagnosis not present

## 2020-11-22 DIAGNOSIS — G894 Chronic pain syndrome: Secondary | ICD-10-CM | POA: Diagnosis not present

## 2020-11-22 DIAGNOSIS — E119 Type 2 diabetes mellitus without complications: Secondary | ICD-10-CM | POA: Diagnosis not present

## 2020-11-22 DIAGNOSIS — E7800 Pure hypercholesterolemia, unspecified: Secondary | ICD-10-CM | POA: Diagnosis not present

## 2020-11-22 DIAGNOSIS — I10 Essential (primary) hypertension: Secondary | ICD-10-CM | POA: Diagnosis not present

## 2020-12-03 DIAGNOSIS — E119 Type 2 diabetes mellitus without complications: Secondary | ICD-10-CM | POA: Diagnosis not present

## 2020-12-17 ENCOUNTER — Emergency Department (HOSPITAL_COMMUNITY)
Admission: EM | Admit: 2020-12-17 | Discharge: 2020-12-17 | Disposition: A | Discharge status: Home / Self Care | Payer: Medicare Other | Attending: Emergency Medicine | Admitting: Emergency Medicine

## 2020-12-17 ENCOUNTER — Emergency Department (HOSPITAL_COMMUNITY): Payer: Medicare Other

## 2020-12-17 ENCOUNTER — Encounter (HOSPITAL_COMMUNITY): Payer: Self-pay

## 2020-12-17 ENCOUNTER — Other Ambulatory Visit: Payer: Self-pay

## 2020-12-17 DIAGNOSIS — Z91040 Latex allergy status: Secondary | ICD-10-CM | POA: Insufficient documentation

## 2020-12-17 DIAGNOSIS — F17210 Nicotine dependence, cigarettes, uncomplicated: Secondary | ICD-10-CM | POA: Diagnosis not present

## 2020-12-17 DIAGNOSIS — E1165 Type 2 diabetes mellitus with hyperglycemia: Secondary | ICD-10-CM | POA: Diagnosis not present

## 2020-12-17 DIAGNOSIS — Z79899 Other long term (current) drug therapy: Secondary | ICD-10-CM | POA: Insufficient documentation

## 2020-12-17 DIAGNOSIS — R0602 Shortness of breath: Secondary | ICD-10-CM | POA: Diagnosis not present

## 2020-12-17 DIAGNOSIS — N183 Chronic kidney disease, stage 3 unspecified: Secondary | ICD-10-CM | POA: Diagnosis not present

## 2020-12-17 DIAGNOSIS — I129 Hypertensive chronic kidney disease with stage 1 through stage 4 chronic kidney disease, or unspecified chronic kidney disease: Secondary | ICD-10-CM | POA: Insufficient documentation

## 2020-12-17 DIAGNOSIS — R0902 Hypoxemia: Secondary | ICD-10-CM | POA: Diagnosis not present

## 2020-12-17 DIAGNOSIS — Z743 Need for continuous supervision: Secondary | ICD-10-CM | POA: Diagnosis not present

## 2020-12-17 DIAGNOSIS — E1122 Type 2 diabetes mellitus with diabetic chronic kidney disease: Secondary | ICD-10-CM | POA: Diagnosis not present

## 2020-12-17 DIAGNOSIS — I499 Cardiac arrhythmia, unspecified: Secondary | ICD-10-CM | POA: Diagnosis not present

## 2020-12-17 LAB — CBC WITH DIFFERENTIAL/PLATELET
Abs Immature Granulocytes: 0.05 10*3/uL (ref 0.00–0.07)
Basophils Absolute: 0.1 10*3/uL (ref 0.0–0.1)
Basophils Relative: 0 %
Eosinophils Absolute: 0.1 10*3/uL (ref 0.0–0.5)
Eosinophils Relative: 1 %
HCT: 52.2 % — ABNORMAL HIGH (ref 36.0–46.0)
Hemoglobin: 17.1 g/dL — ABNORMAL HIGH (ref 12.0–15.0)
Immature Granulocytes: 0 %
Lymphocytes Relative: 12 %
Lymphs Abs: 1.6 10*3/uL (ref 0.7–4.0)
MCH: 28.7 pg (ref 26.0–34.0)
MCHC: 32.8 g/dL (ref 30.0–36.0)
MCV: 87.7 fL (ref 80.0–100.0)
Monocytes Absolute: 0.8 10*3/uL (ref 0.1–1.0)
Monocytes Relative: 6 %
Neutro Abs: 11 10*3/uL — ABNORMAL HIGH (ref 1.7–7.7)
Neutrophils Relative %: 81 %
Platelets: 350 10*3/uL (ref 150–400)
RBC: 5.95 MIL/uL — ABNORMAL HIGH (ref 3.87–5.11)
RDW: 12.8 % (ref 11.5–15.5)
WBC: 13.5 10*3/uL — ABNORMAL HIGH (ref 4.0–10.5)
nRBC: 0 % (ref 0.0–0.2)

## 2020-12-17 LAB — COMPREHENSIVE METABOLIC PANEL
ALT: 17 U/L (ref 0–44)
AST: 19 U/L (ref 15–41)
Albumin: 4.8 g/dL (ref 3.5–5.0)
Alkaline Phosphatase: 77 U/L (ref 38–126)
Anion gap: 12 (ref 5–15)
BUN: 21 mg/dL (ref 8–23)
CO2: 24 mmol/L (ref 22–32)
Calcium: 10 mg/dL (ref 8.9–10.3)
Chloride: 100 mmol/L (ref 98–111)
Creatinine, Ser: 1.25 mg/dL — ABNORMAL HIGH (ref 0.44–1.00)
GFR, Estimated: 45 mL/min — ABNORMAL LOW (ref >60)
Glucose, Bld: 171 mg/dL — ABNORMAL HIGH (ref 70–99)
Potassium: 4.1 mmol/L (ref 3.5–5.1)
Sodium: 136 mmol/L (ref 135–145)
Total Bilirubin: 0.7 mg/dL (ref 0.3–1.2)
Total Protein: 7.7 g/dL (ref 6.5–8.1)

## 2020-12-17 LAB — D-DIMER, QUANTITATIVE: D-Dimer, Quant: 0.57 ug/mL-FEU — ABNORMAL HIGH (ref 0.00–0.50)

## 2020-12-17 LAB — TROPONIN I (HIGH SENSITIVITY): Troponin I (High Sensitivity): 14 ng/L (ref <18)

## 2020-12-17 LAB — BRAIN NATRIURETIC PEPTIDE: B Natriuretic Peptide: 57.7 pg/mL (ref 0.0–100.0)

## 2020-12-17 MED ORDER — ALBUTEROL SULFATE HFA 108 (90 BASE) MCG/ACT IN AERS
2.0000 | INHALATION_SPRAY | Freq: Once | RESPIRATORY_TRACT | Status: AC
  Administered 2020-12-17: 2 via RESPIRATORY_TRACT
  Filled 2020-12-17: qty 6.7

## 2020-12-17 NOTE — ED Triage Notes (Signed)
Pt BIB EMS from home. Pt reports SHOB since 2014. Pt denies SHOB becoming worse since then. Pt has been seen multiple times for this before.

## 2020-12-17 NOTE — Discharge Instructions (Signed)
Your workup was very reassuring today without any abnormalities.  We have placed an ambulatory referral to pulmonology - they will call you to schedule an appointment for further workup of your shortness of breath.   Use the albuterol inhaler as needed for shortness of breath. Smoking cessation may also help significantly.   Return to the ED for any worsening symptoms

## 2020-12-17 NOTE — ED Provider Notes (Signed)
[facility] DEPT Provider Note   CSN: [id number] Arrival date & time: 12/17/20  1308     History Chief Complaint  Patient presents with  . Shortness of Breath    Alexis Mclean is a 75 y.o. female with PMHx HTN, HLD, Diabetes, chronic pain syndrome who presents to the ED via EMS with complaint of SOB. Per triage report SOB has been present since 2014. Pt reports she had back surgery in 2014 and a couple of months afterwards began having a lot of complications including multiple fractures vertebrae. She states that since then she has had some shortness of breath specifically with ambulating. Pt reports that a few years ago she was told she had COPD however states she began feeling short of breath due to specific trees in her backyard - she states she did not have the same issue after the trees were cut down and was never told she had COPD again. Pt did have an inhaler in the past however states that it never seemed to help. She states she has mentioned her SOB to her PCP several times however they have never done anything to treat the SOB. Pt is a current everyday smoker. Has been smoking for > 30 years with average 1 ppd. Pt states the shortness of breath has gotten worse over the past few months and severe today prompting her to call 911. Pt denies fevers, chills, cough, hemoptysis, chest pain, unilateral leg swelling, abdominal distention, or any other associated symptoms. No hx DVT/PE. No recent prolonged travel or immobilization. No hemoptysis. No active malignancy. No exogenous hormone use. Denies recent sick contacts or concern for COVID 19.   The history is provided by the patient, the EMS personnel and medical records.       Past Medical History:  Diagnosis Date  . Back pain   . Depression   . Fracture of T11 vertebra ([place]) may 2015  . Fracture of T8 vertebra ([place]) 12/2013  . Hyperlipemia   . Hypertension   . Neuropathic pain of chest      Patient Active Problem List   Diagnosis Date Noted  . Stage III chronic kidney disease ([place]) 04/28/2019  . Chronic neck pain 04/28/2019  . Depression, major, single episode, moderate ([place]) 04/28/2019  . Insomnia 04/28/2019  . Chronic back pain 04/28/2019  . High risk medication use 04/28/2019  . Controlled type 2 diabetes mellitus without complication, without long-term current use of insulin ([place]) 03/19/2017  . Tobacco abuse counseling 03/19/2017  . Neuropathic pain of chest 03/19/2017  . Hyperlipidemia associated with type 2 diabetes mellitus ([place]) 03/19/2017  . Major depressive disorder, recurrent episode ([place]) 07/20/2014  . Suicidal ideation 05/09/2014  . Hypertension associated with diabetes ([place]) 05/09/2014  . Low back pain 05/09/2014  . Chronic pain syndrome 05/09/2014  . Tobacco abuse 05/09/2014  . Fracture of T8 vertebra ([place]) 12/2013    Past Surgical History:  Procedure Laterality Date  . ABDOMINAL HYSTERECTOMY    . APPENDECTOMY    . BACK SURGERY  03/2013   fusion  . BLADDER REPAIR    . salpingoorrphorectomy Right   . TONSILLECTOMY    . WISDOM TOOTH EXTRACTION       OB History   No obstetric history on file.     Family History  Problem Relation Age of Onset  . Stroke Mother   . Diabetes Mother   . Depression Mother   . Diabetes Father   . Emphysema Father   .  Cancer Father        Lung  . Asthma Father   . Diabetes Sister   . Diabetes Brother     Social History   Tobacco Use  . Smoking status: Current Every Day Smoker    Packs/day: 1.33    Years: 30.00    Pack years: 39.90    Types: Cigarettes    Last attempt to quit: 03/26/2014    Years since quitting: 6.7  . Smokeless tobacco: Never Used  Substance Use Topics  . Alcohol use: No  . Drug use: No    Home Medications Prior to Admission medications   Medication Sig Start Date End Date Taking? Authorizing Provider  amLODipine (NORVASC) 10 MG tablet 10 mg every evening. Takes half daily  02/09/17   [provider]  amLODipine (NORVASC) 5 MG tablet Take 5 mg by mouth daily. 11/02/19   [provider]  Calcium 600-200 MG-UNIT tablet Take 1 tablet by mouth daily.    [provider]  clonazePAM (KLONOPIN) 0.5 MG tablet 0.5 mg at bedtime.  06/12/14   [provider]  diclofenac sodium (VOLTAREN) 1 % GEL APPLY TO AFFECTED AREA FOUR TIMES A DAY AS NEEDED (TRANSDERMAL) 02/24/17   [provider]  INCRUSE ELLIPTA 62.5 MCG/INH AEPB as needed. 02/19/17   [provider]  irbesartan (AVAPRO) 300 MG tablet Take 1 tablet by mouth daily. 04/21/19   [provider]  lidocaine (XYLOCAINE) 5 % ointment Apply 1 application topically as needed.    [provider]  liver oil-zinc oxide (DESITIN) 40 % ointment Apply 1 application topically as needed for irritation.    [provider]  Lysine 500 MG TABS Take by mouth daily.    [provider]  MAGNESIUM PO Take 500 mg by mouth daily.    [provider]  neomycin-polymyxin-hydrocortisone (CORTISPORIN) OTIC solution Apply 1-2 drops to toe after soaking BID 11/07/19   Wallene Huh, DPM  rosuvastatin (CRESTOR) 10 MG tablet  11/03/19   [provider]  vitamin C (ASCORBIC ACID) 500 MG tablet Take 500 mg by mouth daily.    [provider]  XTAMPZA ER 13.5 MG C12A Take 1 capsule by mouth 2 (two) times a day. 01/09/19   [provider]    Allergies    Morphine and related, Fluocinolone, Latex, Neurontin [gabapentin], Penicillins, Sulfa drugs cross reactors, Xanax [alprazolam], Ivp dye [iodinated diagnostic agents], and Lyrica [pregabalin]  Review of Systems   Review of Systems  Constitutional: Negative for chills and fever.  Respiratory: Positive for shortness of breath. Negative for cough, chest tightness and wheezing.   Cardiovascular: Negative for chest pain, palpitations and leg swelling.  Gastrointestinal: Negative for abdominal  distention and abdominal pain.  All other systems reviewed and are negative.   Physical Exam Updated Vital Signs BP 139/90 (BP Location: Right Arm)   Pulse 88   Temp 98.2 F (36.8 C) (Oral)   Resp (!) 23   Ht 4\' 10"  (1.473 m)   Wt 49.9 kg   SpO2 94%   BMI 22.99 kg/m   Physical Exam Vitals and nursing note reviewed.  Constitutional:      Appearance: She is not ill-appearing or diaphoretic.  HENT:     Head: Normocephalic and atraumatic.  Eyes:     Conjunctiva/sclera: Conjunctivae normal.  Cardiovascular:     Rate and Rhythm: Normal rate and regular rhythm.     Pulses: Normal pulses.  Pulmonary:     Effort:  Pulmonary effort is normal. No tachypnea.     Breath sounds: Normal breath sounds. No decreased breath sounds, wheezing, rhonchi or rales.     Comments: Speaking in full sentences without difficulty. Satting 94-96% at rest. Pt ambulated with some increased work of breathing however O2 sats remained above 92%. LCTAB.  Chest:     Chest wall: No tenderness.  Abdominal:     Palpations: Abdomen is soft.     Tenderness: There is no abdominal tenderness. There is no guarding or rebound.  Musculoskeletal:     Cervical back: Neck supple.     Right lower leg: No edema.     Left lower leg: No edema.  Skin:    General: Skin is warm and dry.  Neurological:     Mental Status: She is alert.     ED Results / Procedures / Treatments   Labs (all labs ordered are listed, but only abnormal results are displayed) Labs Reviewed  COMPREHENSIVE METABOLIC PANEL - Abnormal; Notable for the following components:      Result Value   Glucose, Bld 171 (*)    Creatinine, Ser 1.25 (*)    GFR, Estimated 45 (*)    All other components within normal limits  CBC WITH DIFFERENTIAL/PLATELET - Abnormal; Notable for the following components:   WBC 13.5 (*)    RBC 5.95 (*)    Hemoglobin 17.1 (*)    HCT 52.2 (*)    Neutro Abs 11.0 (*)    All other components within normal limits  D-DIMER,  QUANTITATIVE (NOT AT [facility]) - Abnormal; Notable for the following components:   D-Dimer, Quant 0.57 (*)    All other components within normal limits  BRAIN NATRIURETIC PEPTIDE  TROPONIN I (HIGH SENSITIVITY)  TROPONIN I (HIGH SENSITIVITY)    EKG EKG Interpretation  Date/Time:  Monday December 17 2020 13:28:19 EST Ventricular Rate:  91 PR Interval:    QRS Duration: 82 QT Interval:  351 QTC Calculation: 432 R Axis:   -53 Text Interpretation: Sinus rhythm LAD, consider left anterior fascicular block Anterior infarct, old Nonspecific T abnormalities, lateral leads 12 Lead; [place] similar to 2015 Confirmed by Sherwood Gambler [phone]) on 12/17/2020 2:33:43 PM   Radiology DG Chest 2 View  Result Date: 12/17/2020 CLINICAL DATA:  Shortness of breath EXAM: CHEST - 2 VIEW COMPARISON:  December 20, 2019; November 10, 2018. FINDINGS: The lungs are borderline hyperexpanded but clear. Heart size and pulmonary vascularity are normal. No adenopathy appreciable. There is marked collapse of the T11 vertebral body, stable. There is moderate anterior wedging at T8 and to a lesser degree at T7, stable. There is milder anterior wedging at T4. No new fracture evident. IMPRESSION: Stable fractures in the thoracic spine, most marked at T11. Lungs mildly hyperexpanded without edema or airspace opacity. Heart size normal. Electronically Signed   By: Lowella Grip III M.D.   On: 12/17/2020 14:07    Procedures Procedures   Medications Ordered in ED Medications  albuterol (VENTOLIN HFA) 108 (90 Base) MCG/ACT inhaler 2 puff (2 puffs Inhalation Given 12/17/20 1444)    ED Course  I have reviewed the triage vital signs and the nursing notes.  Pertinent labs & imaging results that were available during my care of the patient were reviewed by me and considered in my medical decision making (see chart for details).    MDM Rules/Calculators/A&P  75 year old female who presents to the  ED today complaining of chronic shortness of breath for the past several years, worsening recently.  She is a current every day smoker, greater than 30-pack-year history.  Reports she was told she had COPD at one point however her symptoms seem to improve after cutting down the trees in her backyard and never followed back up.  On arrival to the ED vitals are stable.  Patient is afebrile, nontachycardic.  She is noted to have tachypnea on vital signs with respirations of 23 however while in the room she has no tachypnea present at rest.  She is able speak in full sentences without difficulty and satting between 94 to 96% on room air.  I personally ambulated patient in the room, she did have some slight increased work of breathing with ambulation however O2 sats remained above 92%.  Her lungs are clear to auscultation bilaterally.  She denies any chest pain, abdominal distention, leg swelling.  No history of CHF.  Does not appear patient has ever had an echo in our system.  SPECT patient's smoking history and likely undiagnosed COPD is causing her shortness of breath today.  Will obtain an EKG and chest x-ray, will also provide albuterol inhaler.  And symptoms have been ongoing for multiple years I do not feel she requires additional work-up at this time.  If any abnormalities on chest x-ray may reconsider with lab work.  We will plan to discharge patient home with pulmonology follow-up as this will likely benefit her with confirmed diagnosis of COPD and treatment.  She denies any new cough or fevers.  She is not concerned regarding COVID-19 at this time.   EKG without acute ischemic changes CXR without signs of pneumonia, vascular congestion, pleural effusion, or other abnormalities.   Given pt's age with complaint of worsening SOB labwork to be obtained including CBC, CMP, d dimer, troponin, and BNP. If no abnormalities will plan to discharge home. Attending physician Dr. Regenia Skeeter has evaluated patient as well  and agrees with plan.   CBC with leukocytosis 13.5 today however pt does appear to have chronic elevations from previous labs. CXR without signs of infection and pt without any other infectious like complaints at this time.   WBC  Date Value Ref Range Status  12/17/2020 13.5 (H) 4.0 - 10.5 K/uL Final  05/20/2018 9.9  Final  05/09/2014 12.2 (H) 4.0 - 10.5 K/uL Final  05/08/2014 17.2 (H) 4.0 - 10.5 K/uL Final  03/24/2014 12.0 (H) 4.0 - 10.5 K/uL Final   D dimer 0.57; with age adjustment it is negative  CMP with creatinine 1.25 however appears to be baseline for patient. No other acute abnormalities.  Troponin 14  BNP still in process however pt requesting to leave at this time. I have low suspicion for CHF at this time. Will place ambulatory referral to pulmonology for further work up. Pt instructed to use inhaler as needed and to cut back on cigarette use. Stable for discharge.   This note was prepared using Dragon voice recognition software and may include unintentional dictation errors due to the inherent limitations of voice recognition software.  Final Clinical Impression(s) / ED Diagnoses Final diagnoses:  SOB (shortness of breath)    Rx / DC Orders ED Discharge Orders         Ordered    Ambulatory referral to Pulmonology        12/17/20 1606           Discharge Instructions  Your workup was very reassuring today without any abnormalities.  We have placed an ambulatory referral to pulmonology - they will call you to schedule an appointment for further workup of your shortness of breath.   Use the albuterol inhaler as needed for shortness of breath. Smoking cessation may also help significantly.   Return to the ED for any worsening symptoms       Eustaquio Maize, PA-C 12/17/20 1609    Sherwood Gambler, MD 12/18/20 [phone]

## 2020-12-17 NOTE — ED Notes (Signed)
Pt came out of room and asked this RN to come talk to her. Pt stated that she had to leave and could not wait any longer. PA made aware.

## 2020-12-17 NOTE — ED Notes (Signed)
Pt given discharge paperwork, but left prior to having vital signs checked.

## 2020-12-18 ENCOUNTER — Telehealth: Payer: Self-pay

## 2020-12-18 DIAGNOSIS — M150 Primary generalized (osteo)arthritis: Secondary | ICD-10-CM | POA: Diagnosis not present

## 2020-12-18 DIAGNOSIS — E7800 Pure hypercholesterolemia, unspecified: Secondary | ICD-10-CM | POA: Diagnosis not present

## 2020-12-18 DIAGNOSIS — N183 Chronic kidney disease, stage 3 unspecified: Secondary | ICD-10-CM | POA: Diagnosis not present

## 2020-12-18 DIAGNOSIS — J449 Chronic obstructive pulmonary disease, unspecified: Secondary | ICD-10-CM | POA: Diagnosis not present

## 2020-12-18 DIAGNOSIS — I10 Essential (primary) hypertension: Secondary | ICD-10-CM | POA: Diagnosis not present

## 2020-12-18 DIAGNOSIS — E119 Type 2 diabetes mellitus without complications: Secondary | ICD-10-CM | POA: Diagnosis not present

## 2020-12-18 DIAGNOSIS — E785 Hyperlipidemia, unspecified: Secondary | ICD-10-CM | POA: Diagnosis not present

## 2020-12-18 DIAGNOSIS — M810 Age-related osteoporosis without current pathological fracture: Secondary | ICD-10-CM | POA: Diagnosis not present

## 2020-12-18 NOTE — Telephone Encounter (Signed)
Transition Care Management Follow-up Telephone Call  Date of discharge and from where: 12/17/20 from [facility]  How have you been since you were released from the hospital? Pt states that she is feeling better than she did at the hospital.    Any questions or concerns? No  Items Reviewed:  Did the pt receive and understand the discharge instructions provided? Yes   Medications obtained and verified? Yes   Other? No   Any new allergies since your discharge? No   Dietary orders reviewed? Heart healty  Do you have support at home? Yes   Functional Questionnaire: (I = Independent and D = Dependent) ADLs: I  Bathing/Dressing- I  Meal Prep- I  Eating- I  Maintaining continence- I  Transferring/Ambulation- I  Managing Meds- I  Follow up appointments reviewed:   PCP Hospital f/u appt confirmed? No  Pt states that she sees [employer].  Are transportation arrangements needed? No  If their condition worsens, is the pt aware to call PCP or go to the Emergency Dept.? Yes Was the patient provided with contact information for the PCP's office or ED? Yes Was to pt encouraged to call back with questions or concerns? Yes

## 2021-01-01 DIAGNOSIS — Z7189 Other specified counseling: Secondary | ICD-10-CM | POA: Diagnosis not present

## 2021-01-01 DIAGNOSIS — Z8781 Personal history of (healed) traumatic fracture: Secondary | ICD-10-CM | POA: Diagnosis not present

## 2021-01-01 DIAGNOSIS — M810 Age-related osteoporosis without current pathological fracture: Secondary | ICD-10-CM | POA: Diagnosis not present

## 2021-01-08 DIAGNOSIS — M810 Age-related osteoporosis without current pathological fracture: Secondary | ICD-10-CM | POA: Diagnosis not present

## 2021-01-08 DIAGNOSIS — I10 Essential (primary) hypertension: Secondary | ICD-10-CM | POA: Diagnosis not present

## 2021-01-08 DIAGNOSIS — E119 Type 2 diabetes mellitus without complications: Secondary | ICD-10-CM | POA: Diagnosis not present

## 2021-01-08 DIAGNOSIS — J449 Chronic obstructive pulmonary disease, unspecified: Secondary | ICD-10-CM | POA: Diagnosis not present

## 2021-01-08 DIAGNOSIS — M150 Primary generalized (osteo)arthritis: Secondary | ICD-10-CM | POA: Diagnosis not present

## 2021-01-08 DIAGNOSIS — N183 Chronic kidney disease, stage 3 unspecified: Secondary | ICD-10-CM | POA: Diagnosis not present

## 2021-01-08 DIAGNOSIS — E785 Hyperlipidemia, unspecified: Secondary | ICD-10-CM | POA: Diagnosis not present

## 2021-01-22 DIAGNOSIS — M810 Age-related osteoporosis without current pathological fracture: Secondary | ICD-10-CM | POA: Diagnosis not present

## 2021-01-22 DIAGNOSIS — Z7189 Other specified counseling: Secondary | ICD-10-CM | POA: Diagnosis not present

## 2021-01-22 DIAGNOSIS — T50995A Adverse effect of other drugs, medicaments and biological substances, initial encounter: Secondary | ICD-10-CM | POA: Diagnosis not present

## 2021-01-22 DIAGNOSIS — Z8781 Personal history of (healed) traumatic fracture: Secondary | ICD-10-CM | POA: Diagnosis not present

## 2021-02-06 DIAGNOSIS — N390 Urinary tract infection, site not specified: Secondary | ICD-10-CM | POA: Diagnosis not present

## 2021-02-16 DIAGNOSIS — F17210 Nicotine dependence, cigarettes, uncomplicated: Secondary | ICD-10-CM | POA: Diagnosis not present

## 2021-02-16 DIAGNOSIS — N183 Chronic kidney disease, stage 3 unspecified: Secondary | ICD-10-CM | POA: Diagnosis not present

## 2021-02-16 DIAGNOSIS — R0602 Shortness of breath: Secondary | ICD-10-CM | POA: Diagnosis not present

## 2021-02-16 DIAGNOSIS — E1122 Type 2 diabetes mellitus with diabetic chronic kidney disease: Secondary | ICD-10-CM | POA: Diagnosis not present

## 2021-02-16 DIAGNOSIS — G8929 Other chronic pain: Secondary | ICD-10-CM | POA: Diagnosis not present

## 2021-02-16 DIAGNOSIS — I129 Hypertensive chronic kidney disease with stage 1 through stage 4 chronic kidney disease, or unspecified chronic kidney disease: Secondary | ICD-10-CM | POA: Diagnosis not present

## 2021-02-16 DIAGNOSIS — M546 Pain in thoracic spine: Secondary | ICD-10-CM | POA: Diagnosis not present

## 2021-02-16 DIAGNOSIS — R300 Dysuria: Secondary | ICD-10-CM | POA: Diagnosis not present

## 2021-02-16 DIAGNOSIS — J441 Chronic obstructive pulmonary disease with (acute) exacerbation: Secondary | ICD-10-CM | POA: Diagnosis not present

## 2021-02-16 DIAGNOSIS — I700 Atherosclerosis of aorta: Secondary | ICD-10-CM | POA: Diagnosis not present

## 2021-02-16 DIAGNOSIS — J449 Chronic obstructive pulmonary disease, unspecified: Secondary | ICD-10-CM | POA: Diagnosis not present

## 2021-02-16 DIAGNOSIS — S2241XA Multiple fractures of ribs, right side, initial encounter for closed fracture: Secondary | ICD-10-CM | POA: Diagnosis not present

## 2021-02-16 DIAGNOSIS — N3000 Acute cystitis without hematuria: Secondary | ICD-10-CM | POA: Diagnosis not present

## 2021-03-01 DIAGNOSIS — E7800 Pure hypercholesterolemia, unspecified: Secondary | ICD-10-CM | POA: Diagnosis not present

## 2021-03-01 DIAGNOSIS — M150 Primary generalized (osteo)arthritis: Secondary | ICD-10-CM | POA: Diagnosis not present

## 2021-03-01 DIAGNOSIS — M810 Age-related osteoporosis without current pathological fracture: Secondary | ICD-10-CM | POA: Diagnosis not present

## 2021-03-01 DIAGNOSIS — E119 Type 2 diabetes mellitus without complications: Secondary | ICD-10-CM | POA: Diagnosis not present

## 2021-03-01 DIAGNOSIS — N183 Chronic kidney disease, stage 3 unspecified: Secondary | ICD-10-CM | POA: Diagnosis not present

## 2021-03-01 DIAGNOSIS — E785 Hyperlipidemia, unspecified: Secondary | ICD-10-CM | POA: Diagnosis not present

## 2021-03-01 DIAGNOSIS — J449 Chronic obstructive pulmonary disease, unspecified: Secondary | ICD-10-CM | POA: Diagnosis not present

## 2021-03-01 DIAGNOSIS — I10 Essential (primary) hypertension: Secondary | ICD-10-CM | POA: Diagnosis not present

## 2021-03-13 DIAGNOSIS — G894 Chronic pain syndrome: Secondary | ICD-10-CM | POA: Diagnosis not present

## 2021-03-13 DIAGNOSIS — J449 Chronic obstructive pulmonary disease, unspecified: Secondary | ICD-10-CM | POA: Diagnosis not present

## 2021-03-13 DIAGNOSIS — N183 Chronic kidney disease, stage 3 unspecified: Secondary | ICD-10-CM | POA: Diagnosis not present

## 2021-03-13 DIAGNOSIS — E7800 Pure hypercholesterolemia, unspecified: Secondary | ICD-10-CM | POA: Diagnosis not present

## 2021-03-13 DIAGNOSIS — I10 Essential (primary) hypertension: Secondary | ICD-10-CM | POA: Diagnosis not present

## 2021-03-13 DIAGNOSIS — E119 Type 2 diabetes mellitus without complications: Secondary | ICD-10-CM | POA: Diagnosis not present

## 2021-03-13 DIAGNOSIS — J300 Vasomotor rhinitis: Secondary | ICD-10-CM | POA: Diagnosis not present

## 2021-04-02 ENCOUNTER — Other Ambulatory Visit: Payer: Self-pay | Admitting: Family Medicine

## 2021-04-02 DIAGNOSIS — M79604 Pain in right leg: Secondary | ICD-10-CM

## 2021-04-02 DIAGNOSIS — E119 Type 2 diabetes mellitus without complications: Secondary | ICD-10-CM | POA: Diagnosis not present

## 2021-04-02 DIAGNOSIS — M79605 Pain in left leg: Secondary | ICD-10-CM

## 2021-04-11 ENCOUNTER — Ambulatory Visit
Admission: RE | Admit: 2021-04-11 | Discharge: 2021-04-11 | Disposition: A | Discharge status: Home / Self Care | Payer: Medicare Other | Source: Ambulatory Visit | Attending: Family Medicine | Admitting: Family Medicine

## 2021-04-11 ENCOUNTER — Other Ambulatory Visit: Payer: Self-pay | Admitting: Family Medicine

## 2021-04-11 ENCOUNTER — Other Ambulatory Visit: Payer: Self-pay

## 2021-04-11 DIAGNOSIS — M4317 Spondylolisthesis, lumbosacral region: Secondary | ICD-10-CM | POA: Diagnosis not present

## 2021-04-11 DIAGNOSIS — M25552 Pain in left hip: Secondary | ICD-10-CM

## 2021-04-11 DIAGNOSIS — M79605 Pain in left leg: Secondary | ICD-10-CM

## 2021-04-11 DIAGNOSIS — Z981 Arthrodesis status: Secondary | ICD-10-CM | POA: Diagnosis not present

## 2021-04-11 DIAGNOSIS — M545 Low back pain, unspecified: Secondary | ICD-10-CM

## 2021-04-11 DIAGNOSIS — R202 Paresthesia of skin: Secondary | ICD-10-CM | POA: Diagnosis not present

## 2021-04-11 DIAGNOSIS — M4327 Fusion of spine, lumbosacral region: Secondary | ICD-10-CM | POA: Diagnosis not present

## 2021-04-11 DIAGNOSIS — I70203 Unspecified atherosclerosis of native arteries of extremities, bilateral legs: Secondary | ICD-10-CM | POA: Diagnosis not present

## 2021-04-11 DIAGNOSIS — M79604 Pain in right leg: Secondary | ICD-10-CM

## 2021-04-11 DIAGNOSIS — M25551 Pain in right hip: Secondary | ICD-10-CM

## 2021-04-11 DIAGNOSIS — I10 Essential (primary) hypertension: Secondary | ICD-10-CM | POA: Diagnosis not present

## 2021-04-11 DIAGNOSIS — M160 Bilateral primary osteoarthritis of hip: Secondary | ICD-10-CM | POA: Diagnosis not present

## 2021-04-30 DIAGNOSIS — E785 Hyperlipidemia, unspecified: Secondary | ICD-10-CM | POA: Diagnosis not present

## 2021-04-30 DIAGNOSIS — M810 Age-related osteoporosis without current pathological fracture: Secondary | ICD-10-CM | POA: Diagnosis not present

## 2021-04-30 DIAGNOSIS — I10 Essential (primary) hypertension: Secondary | ICD-10-CM | POA: Diagnosis not present

## 2021-04-30 DIAGNOSIS — E1122 Type 2 diabetes mellitus with diabetic chronic kidney disease: Secondary | ICD-10-CM | POA: Diagnosis not present

## 2021-04-30 DIAGNOSIS — M150 Primary generalized (osteo)arthritis: Secondary | ICD-10-CM | POA: Diagnosis not present

## 2021-04-30 DIAGNOSIS — N183 Chronic kidney disease, stage 3 unspecified: Secondary | ICD-10-CM | POA: Diagnosis not present

## 2021-04-30 DIAGNOSIS — J449 Chronic obstructive pulmonary disease, unspecified: Secondary | ICD-10-CM | POA: Diagnosis not present

## 2021-04-30 DIAGNOSIS — N1832 Chronic kidney disease, stage 3b: Secondary | ICD-10-CM | POA: Diagnosis not present

## 2021-04-30 DIAGNOSIS — E7800 Pure hypercholesterolemia, unspecified: Secondary | ICD-10-CM | POA: Diagnosis not present

## 2021-05-01 DIAGNOSIS — E1122 Type 2 diabetes mellitus with diabetic chronic kidney disease: Secondary | ICD-10-CM | POA: Diagnosis not present

## 2021-05-28 DIAGNOSIS — M150 Primary generalized (osteo)arthritis: Secondary | ICD-10-CM | POA: Diagnosis not present

## 2021-05-28 DIAGNOSIS — N1832 Chronic kidney disease, stage 3b: Secondary | ICD-10-CM | POA: Diagnosis not present

## 2021-05-28 DIAGNOSIS — J449 Chronic obstructive pulmonary disease, unspecified: Secondary | ICD-10-CM | POA: Diagnosis not present

## 2021-05-28 DIAGNOSIS — E1122 Type 2 diabetes mellitus with diabetic chronic kidney disease: Secondary | ICD-10-CM | POA: Diagnosis not present

## 2021-05-28 DIAGNOSIS — E7800 Pure hypercholesterolemia, unspecified: Secondary | ICD-10-CM | POA: Diagnosis not present

## 2021-05-28 DIAGNOSIS — M810 Age-related osteoporosis without current pathological fracture: Secondary | ICD-10-CM | POA: Diagnosis not present

## 2021-05-28 DIAGNOSIS — I10 Essential (primary) hypertension: Secondary | ICD-10-CM | POA: Diagnosis not present

## 2021-05-31 DIAGNOSIS — E1122 Type 2 diabetes mellitus with diabetic chronic kidney disease: Secondary | ICD-10-CM | POA: Diagnosis not present

## 2021-06-13 DIAGNOSIS — I10 Essential (primary) hypertension: Secondary | ICD-10-CM | POA: Diagnosis not present

## 2021-06-13 DIAGNOSIS — E7800 Pure hypercholesterolemia, unspecified: Secondary | ICD-10-CM | POA: Diagnosis not present

## 2021-06-13 DIAGNOSIS — N1832 Chronic kidney disease, stage 3b: Secondary | ICD-10-CM | POA: Diagnosis not present

## 2021-06-13 DIAGNOSIS — J449 Chronic obstructive pulmonary disease, unspecified: Secondary | ICD-10-CM | POA: Diagnosis not present

## 2021-06-13 DIAGNOSIS — E1122 Type 2 diabetes mellitus with diabetic chronic kidney disease: Secondary | ICD-10-CM | POA: Diagnosis not present

## 2021-06-13 DIAGNOSIS — G894 Chronic pain syndrome: Secondary | ICD-10-CM | POA: Diagnosis not present

## 2021-06-19 DIAGNOSIS — H2513 Age-related nuclear cataract, bilateral: Secondary | ICD-10-CM | POA: Diagnosis not present

## 2021-06-19 DIAGNOSIS — E119 Type 2 diabetes mellitus without complications: Secondary | ICD-10-CM | POA: Diagnosis not present

## 2021-06-19 DIAGNOSIS — H40033 Anatomical narrow angle, bilateral: Secondary | ICD-10-CM | POA: Diagnosis not present

## 2021-07-02 DIAGNOSIS — E1122 Type 2 diabetes mellitus with diabetic chronic kidney disease: Secondary | ICD-10-CM | POA: Diagnosis not present

## 2021-07-03 DIAGNOSIS — E7800 Pure hypercholesterolemia, unspecified: Secondary | ICD-10-CM | POA: Diagnosis not present

## 2021-07-03 DIAGNOSIS — I10 Essential (primary) hypertension: Secondary | ICD-10-CM | POA: Diagnosis not present

## 2021-07-03 DIAGNOSIS — E1122 Type 2 diabetes mellitus with diabetic chronic kidney disease: Secondary | ICD-10-CM | POA: Diagnosis not present

## 2021-07-03 DIAGNOSIS — M150 Primary generalized (osteo)arthritis: Secondary | ICD-10-CM | POA: Diagnosis not present

## 2021-07-03 DIAGNOSIS — M810 Age-related osteoporosis without current pathological fracture: Secondary | ICD-10-CM | POA: Diagnosis not present

## 2021-07-03 DIAGNOSIS — J449 Chronic obstructive pulmonary disease, unspecified: Secondary | ICD-10-CM | POA: Diagnosis not present

## 2021-07-03 DIAGNOSIS — N1832 Chronic kidney disease, stage 3b: Secondary | ICD-10-CM | POA: Diagnosis not present

## 2021-07-09 DIAGNOSIS — N390 Urinary tract infection, site not specified: Secondary | ICD-10-CM | POA: Diagnosis not present

## 2021-07-12 DIAGNOSIS — N1832 Chronic kidney disease, stage 3b: Secondary | ICD-10-CM | POA: Diagnosis not present

## 2021-07-12 DIAGNOSIS — E1122 Type 2 diabetes mellitus with diabetic chronic kidney disease: Secondary | ICD-10-CM | POA: Diagnosis not present

## 2021-07-12 DIAGNOSIS — M150 Primary generalized (osteo)arthritis: Secondary | ICD-10-CM | POA: Diagnosis not present

## 2021-07-12 DIAGNOSIS — M810 Age-related osteoporosis without current pathological fracture: Secondary | ICD-10-CM | POA: Diagnosis not present

## 2021-07-12 DIAGNOSIS — J449 Chronic obstructive pulmonary disease, unspecified: Secondary | ICD-10-CM | POA: Diagnosis not present

## 2021-07-12 DIAGNOSIS — I10 Essential (primary) hypertension: Secondary | ICD-10-CM | POA: Diagnosis not present

## 2021-07-12 DIAGNOSIS — E7800 Pure hypercholesterolemia, unspecified: Secondary | ICD-10-CM | POA: Diagnosis not present

## 2021-07-30 DIAGNOSIS — H43813 Vitreous degeneration, bilateral: Secondary | ICD-10-CM | POA: Diagnosis not present

## 2021-07-30 DIAGNOSIS — H2513 Age-related nuclear cataract, bilateral: Secondary | ICD-10-CM | POA: Diagnosis not present

## 2021-07-30 DIAGNOSIS — H40033 Anatomical narrow angle, bilateral: Secondary | ICD-10-CM | POA: Diagnosis not present

## 2021-08-07 DIAGNOSIS — N3000 Acute cystitis without hematuria: Secondary | ICD-10-CM | POA: Diagnosis not present

## 2021-08-22 DIAGNOSIS — M549 Dorsalgia, unspecified: Secondary | ICD-10-CM | POA: Diagnosis not present

## 2021-08-22 DIAGNOSIS — M150 Primary generalized (osteo)arthritis: Secondary | ICD-10-CM | POA: Diagnosis not present

## 2021-08-22 DIAGNOSIS — H269 Unspecified cataract: Secondary | ICD-10-CM | POA: Diagnosis not present

## 2021-08-22 DIAGNOSIS — J449 Chronic obstructive pulmonary disease, unspecified: Secondary | ICD-10-CM | POA: Diagnosis not present

## 2021-08-22 DIAGNOSIS — Z01818 Encounter for other preprocedural examination: Secondary | ICD-10-CM | POA: Diagnosis not present

## 2021-08-22 DIAGNOSIS — N1832 Chronic kidney disease, stage 3b: Secondary | ICD-10-CM | POA: Diagnosis not present

## 2021-08-26 DIAGNOSIS — E1122 Type 2 diabetes mellitus with diabetic chronic kidney disease: Secondary | ICD-10-CM | POA: Diagnosis not present

## 2021-08-26 DIAGNOSIS — Z880 Allergy status to penicillin: Secondary | ICD-10-CM | POA: Diagnosis not present

## 2021-08-26 DIAGNOSIS — N183 Chronic kidney disease, stage 3 unspecified: Secondary | ICD-10-CM | POA: Diagnosis not present

## 2021-08-26 DIAGNOSIS — F32A Depression, unspecified: Secondary | ICD-10-CM | POA: Diagnosis not present

## 2021-08-26 DIAGNOSIS — E1136 Type 2 diabetes mellitus with diabetic cataract: Secondary | ICD-10-CM | POA: Diagnosis not present

## 2021-08-26 DIAGNOSIS — E1140 Type 2 diabetes mellitus with diabetic neuropathy, unspecified: Secondary | ICD-10-CM | POA: Diagnosis not present

## 2021-08-26 DIAGNOSIS — Z79899 Other long term (current) drug therapy: Secondary | ICD-10-CM | POA: Diagnosis not present

## 2021-08-26 DIAGNOSIS — H2511 Age-related nuclear cataract, right eye: Secondary | ICD-10-CM | POA: Diagnosis not present

## 2021-08-26 DIAGNOSIS — J449 Chronic obstructive pulmonary disease, unspecified: Secondary | ICD-10-CM | POA: Diagnosis not present

## 2021-08-26 DIAGNOSIS — Z7984 Long term (current) use of oral hypoglycemic drugs: Secondary | ICD-10-CM | POA: Diagnosis not present

## 2021-08-26 DIAGNOSIS — I120 Hypertensive chronic kidney disease with stage 5 chronic kidney disease or end stage renal disease: Secondary | ICD-10-CM | POA: Diagnosis not present

## 2021-08-26 DIAGNOSIS — E785 Hyperlipidemia, unspecified: Secondary | ICD-10-CM | POA: Diagnosis not present

## 2021-08-26 DIAGNOSIS — G4700 Insomnia, unspecified: Secondary | ICD-10-CM | POA: Diagnosis not present

## 2021-08-28 DIAGNOSIS — E7800 Pure hypercholesterolemia, unspecified: Secondary | ICD-10-CM | POA: Diagnosis not present

## 2021-08-28 DIAGNOSIS — H269 Unspecified cataract: Secondary | ICD-10-CM | POA: Diagnosis not present

## 2021-08-28 DIAGNOSIS — M810 Age-related osteoporosis without current pathological fracture: Secondary | ICD-10-CM | POA: Diagnosis not present

## 2021-08-28 DIAGNOSIS — E1122 Type 2 diabetes mellitus with diabetic chronic kidney disease: Secondary | ICD-10-CM | POA: Diagnosis not present

## 2021-08-28 DIAGNOSIS — M150 Primary generalized (osteo)arthritis: Secondary | ICD-10-CM | POA: Diagnosis not present

## 2021-08-28 DIAGNOSIS — I10 Essential (primary) hypertension: Secondary | ICD-10-CM | POA: Diagnosis not present

## 2021-08-28 DIAGNOSIS — N1832 Chronic kidney disease, stage 3b: Secondary | ICD-10-CM | POA: Diagnosis not present

## 2021-08-28 DIAGNOSIS — J449 Chronic obstructive pulmonary disease, unspecified: Secondary | ICD-10-CM | POA: Diagnosis not present

## 2021-08-30 DIAGNOSIS — E1122 Type 2 diabetes mellitus with diabetic chronic kidney disease: Secondary | ICD-10-CM | POA: Diagnosis not present

## 2021-09-17 DIAGNOSIS — J449 Chronic obstructive pulmonary disease, unspecified: Secondary | ICD-10-CM | POA: Diagnosis not present

## 2021-09-17 DIAGNOSIS — E1122 Type 2 diabetes mellitus with diabetic chronic kidney disease: Secondary | ICD-10-CM | POA: Diagnosis not present

## 2021-09-17 DIAGNOSIS — Z880 Allergy status to penicillin: Secondary | ICD-10-CM | POA: Diagnosis not present

## 2021-09-17 DIAGNOSIS — Z885 Allergy status to narcotic agent status: Secondary | ICD-10-CM | POA: Diagnosis not present

## 2021-09-17 DIAGNOSIS — G4700 Insomnia, unspecified: Secondary | ICD-10-CM | POA: Diagnosis not present

## 2021-09-17 DIAGNOSIS — R300 Dysuria: Secondary | ICD-10-CM | POA: Diagnosis not present

## 2021-09-17 DIAGNOSIS — N189 Chronic kidney disease, unspecified: Secondary | ICD-10-CM | POA: Diagnosis not present

## 2021-09-17 DIAGNOSIS — E1140 Type 2 diabetes mellitus with diabetic neuropathy, unspecified: Secondary | ICD-10-CM | POA: Diagnosis not present

## 2021-09-17 DIAGNOSIS — M810 Age-related osteoporosis without current pathological fracture: Secondary | ICD-10-CM | POA: Diagnosis not present

## 2021-09-17 DIAGNOSIS — Z791 Long term (current) use of non-steroidal anti-inflammatories (NSAID): Secondary | ICD-10-CM | POA: Diagnosis not present

## 2021-09-17 DIAGNOSIS — I129 Hypertensive chronic kidney disease with stage 1 through stage 4 chronic kidney disease, or unspecified chronic kidney disease: Secondary | ICD-10-CM | POA: Diagnosis not present

## 2021-09-17 DIAGNOSIS — R42 Dizziness and giddiness: Secondary | ICD-10-CM | POA: Diagnosis not present

## 2021-09-17 DIAGNOSIS — N3000 Acute cystitis without hematuria: Secondary | ICD-10-CM | POA: Diagnosis not present

## 2021-09-21 DIAGNOSIS — N3000 Acute cystitis without hematuria: Secondary | ICD-10-CM | POA: Diagnosis not present

## 2021-09-24 DIAGNOSIS — E1122 Type 2 diabetes mellitus with diabetic chronic kidney disease: Secondary | ICD-10-CM | POA: Diagnosis not present

## 2021-09-24 DIAGNOSIS — I10 Essential (primary) hypertension: Secondary | ICD-10-CM | POA: Diagnosis not present

## 2021-09-24 DIAGNOSIS — R300 Dysuria: Secondary | ICD-10-CM | POA: Diagnosis not present

## 2021-10-01 DIAGNOSIS — M150 Primary generalized (osteo)arthritis: Secondary | ICD-10-CM | POA: Diagnosis not present

## 2021-10-01 DIAGNOSIS — J449 Chronic obstructive pulmonary disease, unspecified: Secondary | ICD-10-CM | POA: Diagnosis not present

## 2021-10-01 DIAGNOSIS — I10 Essential (primary) hypertension: Secondary | ICD-10-CM | POA: Diagnosis not present

## 2021-10-01 DIAGNOSIS — E1122 Type 2 diabetes mellitus with diabetic chronic kidney disease: Secondary | ICD-10-CM | POA: Diagnosis not present

## 2021-10-01 DIAGNOSIS — E7800 Pure hypercholesterolemia, unspecified: Secondary | ICD-10-CM | POA: Diagnosis not present

## 2021-10-01 DIAGNOSIS — M810 Age-related osteoporosis without current pathological fracture: Secondary | ICD-10-CM | POA: Diagnosis not present

## 2021-10-01 DIAGNOSIS — N1832 Chronic kidney disease, stage 3b: Secondary | ICD-10-CM | POA: Diagnosis not present

## 2021-10-02 DIAGNOSIS — E1122 Type 2 diabetes mellitus with diabetic chronic kidney disease: Secondary | ICD-10-CM | POA: Diagnosis not present

## 2021-10-09 DIAGNOSIS — H269 Unspecified cataract: Secondary | ICD-10-CM | POA: Diagnosis not present

## 2021-10-09 DIAGNOSIS — J449 Chronic obstructive pulmonary disease, unspecified: Secondary | ICD-10-CM | POA: Diagnosis not present

## 2021-10-09 DIAGNOSIS — E1122 Type 2 diabetes mellitus with diabetic chronic kidney disease: Secondary | ICD-10-CM | POA: Diagnosis not present

## 2021-10-09 DIAGNOSIS — E7800 Pure hypercholesterolemia, unspecified: Secondary | ICD-10-CM | POA: Diagnosis not present

## 2021-10-09 DIAGNOSIS — M810 Age-related osteoporosis without current pathological fracture: Secondary | ICD-10-CM | POA: Diagnosis not present

## 2021-10-09 DIAGNOSIS — I10 Essential (primary) hypertension: Secondary | ICD-10-CM | POA: Diagnosis not present

## 2021-10-09 DIAGNOSIS — N1832 Chronic kidney disease, stage 3b: Secondary | ICD-10-CM | POA: Diagnosis not present

## 2021-10-09 DIAGNOSIS — M150 Primary generalized (osteo)arthritis: Secondary | ICD-10-CM | POA: Diagnosis not present

## 2021-11-01 DIAGNOSIS — E1122 Type 2 diabetes mellitus with diabetic chronic kidney disease: Secondary | ICD-10-CM | POA: Diagnosis not present

## 2021-11-07 DIAGNOSIS — I10 Essential (primary) hypertension: Secondary | ICD-10-CM | POA: Diagnosis not present

## 2021-11-07 DIAGNOSIS — J449 Chronic obstructive pulmonary disease, unspecified: Secondary | ICD-10-CM | POA: Diagnosis not present

## 2021-11-07 DIAGNOSIS — E1122 Type 2 diabetes mellitus with diabetic chronic kidney disease: Secondary | ICD-10-CM | POA: Diagnosis not present

## 2021-11-07 DIAGNOSIS — E7800 Pure hypercholesterolemia, unspecified: Secondary | ICD-10-CM | POA: Diagnosis not present

## 2021-11-26 DIAGNOSIS — R69 Illness, unspecified: Secondary | ICD-10-CM | POA: Diagnosis not present

## 2021-11-26 DIAGNOSIS — Z961 Presence of intraocular lens: Secondary | ICD-10-CM | POA: Diagnosis not present

## 2021-11-26 DIAGNOSIS — H2512 Age-related nuclear cataract, left eye: Secondary | ICD-10-CM | POA: Diagnosis not present

## 2021-11-26 DIAGNOSIS — H40039 Anatomical narrow angle, unspecified eye: Secondary | ICD-10-CM | POA: Diagnosis not present

## 2021-11-26 DIAGNOSIS — Z01818 Encounter for other preprocedural examination: Secondary | ICD-10-CM | POA: Diagnosis not present

## 2021-12-03 DIAGNOSIS — E1122 Type 2 diabetes mellitus with diabetic chronic kidney disease: Secondary | ICD-10-CM | POA: Diagnosis not present

## 2021-12-19 DIAGNOSIS — H2512 Age-related nuclear cataract, left eye: Secondary | ICD-10-CM | POA: Diagnosis not present

## 2021-12-27 DIAGNOSIS — N1832 Chronic kidney disease, stage 3b: Secondary | ICD-10-CM | POA: Diagnosis not present

## 2021-12-27 DIAGNOSIS — E1122 Type 2 diabetes mellitus with diabetic chronic kidney disease: Secondary | ICD-10-CM | POA: Diagnosis not present

## 2021-12-27 DIAGNOSIS — E7800 Pure hypercholesterolemia, unspecified: Secondary | ICD-10-CM | POA: Diagnosis not present

## 2021-12-27 DIAGNOSIS — M549 Dorsalgia, unspecified: Secondary | ICD-10-CM | POA: Diagnosis not present

## 2021-12-27 DIAGNOSIS — R300 Dysuria: Secondary | ICD-10-CM | POA: Diagnosis not present

## 2021-12-27 DIAGNOSIS — I10 Essential (primary) hypertension: Secondary | ICD-10-CM | POA: Diagnosis not present

## 2021-12-27 DIAGNOSIS — J449 Chronic obstructive pulmonary disease, unspecified: Secondary | ICD-10-CM | POA: Diagnosis not present

## 2021-12-27 DIAGNOSIS — G894 Chronic pain syndrome: Secondary | ICD-10-CM | POA: Diagnosis not present

## 2021-12-31 DIAGNOSIS — E1122 Type 2 diabetes mellitus with diabetic chronic kidney disease: Secondary | ICD-10-CM | POA: Diagnosis not present

## 2021-12-31 DIAGNOSIS — N1832 Chronic kidney disease, stage 3b: Secondary | ICD-10-CM | POA: Diagnosis not present

## 2021-12-31 DIAGNOSIS — E7800 Pure hypercholesterolemia, unspecified: Secondary | ICD-10-CM | POA: Diagnosis not present

## 2021-12-31 DIAGNOSIS — I10 Essential (primary) hypertension: Secondary | ICD-10-CM | POA: Diagnosis not present

## 2022-01-03 DIAGNOSIS — N1832 Chronic kidney disease, stage 3b: Secondary | ICD-10-CM | POA: Diagnosis not present

## 2022-01-03 DIAGNOSIS — E7800 Pure hypercholesterolemia, unspecified: Secondary | ICD-10-CM | POA: Diagnosis not present

## 2022-01-03 DIAGNOSIS — I10 Essential (primary) hypertension: Secondary | ICD-10-CM | POA: Diagnosis not present

## 2022-01-03 DIAGNOSIS — E1122 Type 2 diabetes mellitus with diabetic chronic kidney disease: Secondary | ICD-10-CM | POA: Diagnosis not present

## 2022-01-31 DIAGNOSIS — E1122 Type 2 diabetes mellitus with diabetic chronic kidney disease: Secondary | ICD-10-CM | POA: Diagnosis not present

## 2022-03-02 DIAGNOSIS — E1122 Type 2 diabetes mellitus with diabetic chronic kidney disease: Secondary | ICD-10-CM | POA: Diagnosis not present

## 2022-03-24 DIAGNOSIS — R9431 Abnormal electrocardiogram [ECG] [EKG]: Secondary | ICD-10-CM | POA: Diagnosis not present

## 2022-03-24 DIAGNOSIS — G9389 Other specified disorders of brain: Secondary | ICD-10-CM | POA: Diagnosis not present

## 2022-03-24 DIAGNOSIS — Z885 Allergy status to narcotic agent status: Secondary | ICD-10-CM | POA: Diagnosis not present

## 2022-03-24 DIAGNOSIS — Z881 Allergy status to other antibiotic agents status: Secondary | ICD-10-CM | POA: Diagnosis not present

## 2022-03-24 DIAGNOSIS — J449 Chronic obstructive pulmonary disease, unspecified: Secondary | ICD-10-CM | POA: Diagnosis not present

## 2022-03-24 DIAGNOSIS — R269 Unspecified abnormalities of gait and mobility: Secondary | ICD-10-CM | POA: Diagnosis not present

## 2022-03-24 DIAGNOSIS — I129 Hypertensive chronic kidney disease with stage 1 through stage 4 chronic kidney disease, or unspecified chronic kidney disease: Secondary | ICD-10-CM | POA: Diagnosis not present

## 2022-03-24 DIAGNOSIS — M810 Age-related osteoporosis without current pathological fracture: Secondary | ICD-10-CM | POA: Diagnosis not present

## 2022-03-24 DIAGNOSIS — D72829 Elevated white blood cell count, unspecified: Secondary | ICD-10-CM | POA: Diagnosis not present

## 2022-03-24 DIAGNOSIS — Z043 Encounter for examination and observation following other accident: Secondary | ICD-10-CM | POA: Diagnosis not present

## 2022-03-24 DIAGNOSIS — Z9181 History of falling: Secondary | ICD-10-CM | POA: Diagnosis not present

## 2022-03-24 DIAGNOSIS — Z882 Allergy status to sulfonamides status: Secondary | ICD-10-CM | POA: Diagnosis not present

## 2022-03-24 DIAGNOSIS — R296 Repeated falls: Secondary | ICD-10-CM | POA: Diagnosis not present

## 2022-03-24 DIAGNOSIS — N3000 Acute cystitis without hematuria: Secondary | ICD-10-CM | POA: Diagnosis not present

## 2022-03-24 DIAGNOSIS — Z880 Allergy status to penicillin: Secondary | ICD-10-CM | POA: Diagnosis not present

## 2022-03-24 DIAGNOSIS — M479 Spondylosis, unspecified: Secondary | ICD-10-CM | POA: Diagnosis not present

## 2022-03-24 DIAGNOSIS — E1140 Type 2 diabetes mellitus with diabetic neuropathy, unspecified: Secondary | ICD-10-CM | POA: Diagnosis not present

## 2022-03-24 DIAGNOSIS — M549 Dorsalgia, unspecified: Secondary | ICD-10-CM | POA: Diagnosis not present

## 2022-03-24 DIAGNOSIS — M4854XA Collapsed vertebra, not elsewhere classified, thoracic region, initial encounter for fracture: Secondary | ICD-10-CM | POA: Diagnosis not present

## 2022-03-24 DIAGNOSIS — F32A Depression, unspecified: Secondary | ICD-10-CM | POA: Diagnosis not present

## 2022-03-24 DIAGNOSIS — R531 Weakness: Secondary | ICD-10-CM | POA: Diagnosis not present

## 2022-03-24 DIAGNOSIS — N183 Chronic kidney disease, stage 3 unspecified: Secondary | ICD-10-CM | POA: Diagnosis not present

## 2022-03-24 DIAGNOSIS — M533 Sacrococcygeal disorders, not elsewhere classified: Secondary | ICD-10-CM | POA: Diagnosis not present

## 2022-03-24 DIAGNOSIS — G8911 Acute pain due to trauma: Secondary | ICD-10-CM | POA: Diagnosis not present

## 2022-03-24 DIAGNOSIS — Z743 Need for continuous supervision: Secondary | ICD-10-CM | POA: Diagnosis not present

## 2022-03-24 DIAGNOSIS — G4700 Insomnia, unspecified: Secondary | ICD-10-CM | POA: Diagnosis not present

## 2022-03-24 DIAGNOSIS — R059 Cough, unspecified: Secondary | ICD-10-CM | POA: Diagnosis not present

## 2022-03-24 DIAGNOSIS — E1122 Type 2 diabetes mellitus with diabetic chronic kidney disease: Secondary | ICD-10-CM | POA: Diagnosis not present

## 2022-03-24 DIAGNOSIS — S90122A Contusion of left lesser toe(s) without damage to nail, initial encounter: Secondary | ICD-10-CM | POA: Diagnosis not present

## 2022-03-25 DIAGNOSIS — E1122 Type 2 diabetes mellitus with diabetic chronic kidney disease: Secondary | ICD-10-CM | POA: Diagnosis not present

## 2022-03-25 DIAGNOSIS — J449 Chronic obstructive pulmonary disease, unspecified: Secondary | ICD-10-CM | POA: Diagnosis not present

## 2022-03-25 DIAGNOSIS — I10 Essential (primary) hypertension: Secondary | ICD-10-CM | POA: Diagnosis not present

## 2022-03-25 DIAGNOSIS — E7800 Pure hypercholesterolemia, unspecified: Secondary | ICD-10-CM | POA: Diagnosis not present

## 2022-03-25 DIAGNOSIS — N1832 Chronic kidney disease, stage 3b: Secondary | ICD-10-CM | POA: Diagnosis not present

## 2022-03-27 DIAGNOSIS — R531 Weakness: Secondary | ICD-10-CM | POA: Diagnosis not present

## 2022-03-27 DIAGNOSIS — G894 Chronic pain syndrome: Secondary | ICD-10-CM | POA: Diagnosis not present

## 2022-03-27 DIAGNOSIS — I10 Essential (primary) hypertension: Secondary | ICD-10-CM | POA: Diagnosis not present

## 2022-03-27 DIAGNOSIS — M150 Primary generalized (osteo)arthritis: Secondary | ICD-10-CM | POA: Diagnosis not present

## 2022-03-27 DIAGNOSIS — M549 Dorsalgia, unspecified: Secondary | ICD-10-CM | POA: Diagnosis not present

## 2022-03-27 DIAGNOSIS — E7800 Pure hypercholesterolemia, unspecified: Secondary | ICD-10-CM | POA: Diagnosis not present

## 2022-03-27 DIAGNOSIS — J449 Chronic obstructive pulmonary disease, unspecified: Secondary | ICD-10-CM | POA: Diagnosis not present

## 2022-03-27 DIAGNOSIS — N1832 Chronic kidney disease, stage 3b: Secondary | ICD-10-CM | POA: Diagnosis not present

## 2022-03-27 DIAGNOSIS — E1122 Type 2 diabetes mellitus with diabetic chronic kidney disease: Secondary | ICD-10-CM | POA: Diagnosis not present

## 2022-04-03 DIAGNOSIS — N1832 Chronic kidney disease, stage 3b: Secondary | ICD-10-CM | POA: Diagnosis not present

## 2022-04-03 DIAGNOSIS — E1122 Type 2 diabetes mellitus with diabetic chronic kidney disease: Secondary | ICD-10-CM | POA: Diagnosis not present

## 2022-04-03 DIAGNOSIS — J449 Chronic obstructive pulmonary disease, unspecified: Secondary | ICD-10-CM | POA: Diagnosis not present

## 2022-04-03 DIAGNOSIS — I10 Essential (primary) hypertension: Secondary | ICD-10-CM | POA: Diagnosis not present

## 2022-04-03 DIAGNOSIS — M810 Age-related osteoporosis without current pathological fracture: Secondary | ICD-10-CM | POA: Diagnosis not present

## 2022-04-03 DIAGNOSIS — E7800 Pure hypercholesterolemia, unspecified: Secondary | ICD-10-CM | POA: Diagnosis not present

## 2022-04-07 DIAGNOSIS — R300 Dysuria: Secondary | ICD-10-CM | POA: Diagnosis not present

## 2022-04-17 DIAGNOSIS — R0789 Other chest pain: Secondary | ICD-10-CM | POA: Diagnosis not present

## 2022-04-17 DIAGNOSIS — I214 Non-ST elevation (NSTEMI) myocardial infarction: Secondary | ICD-10-CM | POA: Diagnosis not present

## 2022-04-17 DIAGNOSIS — I5189 Other ill-defined heart diseases: Secondary | ICD-10-CM | POA: Diagnosis not present

## 2022-04-17 DIAGNOSIS — Z880 Allergy status to penicillin: Secondary | ICD-10-CM | POA: Diagnosis not present

## 2022-04-17 DIAGNOSIS — R1310 Dysphagia, unspecified: Secondary | ICD-10-CM | POA: Diagnosis not present

## 2022-04-17 DIAGNOSIS — Z882 Allergy status to sulfonamides status: Secondary | ICD-10-CM | POA: Diagnosis not present

## 2022-04-17 DIAGNOSIS — J9811 Atelectasis: Secondary | ICD-10-CM | POA: Diagnosis not present

## 2022-04-17 DIAGNOSIS — J449 Chronic obstructive pulmonary disease, unspecified: Secondary | ICD-10-CM | POA: Diagnosis not present

## 2022-04-17 DIAGNOSIS — E1165 Type 2 diabetes mellitus with hyperglycemia: Secondary | ICD-10-CM | POA: Diagnosis not present

## 2022-04-17 DIAGNOSIS — I493 Ventricular premature depolarization: Secondary | ICD-10-CM | POA: Diagnosis not present

## 2022-04-17 DIAGNOSIS — R079 Chest pain, unspecified: Secondary | ICD-10-CM | POA: Diagnosis not present

## 2022-04-17 DIAGNOSIS — Z91040 Latex allergy status: Secondary | ICD-10-CM | POA: Diagnosis not present

## 2022-04-17 DIAGNOSIS — J479 Bronchiectasis, uncomplicated: Secondary | ICD-10-CM | POA: Diagnosis not present

## 2022-04-17 DIAGNOSIS — I129 Hypertensive chronic kidney disease with stage 1 through stage 4 chronic kidney disease, or unspecified chronic kidney disease: Secondary | ICD-10-CM | POA: Diagnosis not present

## 2022-04-17 DIAGNOSIS — Z888 Allergy status to other drugs, medicaments and biological substances status: Secondary | ICD-10-CM | POA: Diagnosis not present

## 2022-04-17 DIAGNOSIS — N183 Chronic kidney disease, stage 3 unspecified: Secondary | ICD-10-CM | POA: Diagnosis not present

## 2022-04-17 DIAGNOSIS — G8929 Other chronic pain: Secondary | ICD-10-CM | POA: Diagnosis not present

## 2022-04-17 DIAGNOSIS — M810 Age-related osteoporosis without current pathological fracture: Secondary | ICD-10-CM | POA: Diagnosis not present

## 2022-04-17 DIAGNOSIS — Z743 Need for continuous supervision: Secondary | ICD-10-CM | POA: Diagnosis not present

## 2022-04-17 DIAGNOSIS — M545 Low back pain, unspecified: Secondary | ICD-10-CM | POA: Diagnosis not present

## 2022-04-17 DIAGNOSIS — I2510 Atherosclerotic heart disease of native coronary artery without angina pectoris: Secondary | ICD-10-CM | POA: Diagnosis not present

## 2022-04-17 DIAGNOSIS — I10 Essential (primary) hypertension: Secondary | ICD-10-CM | POA: Diagnosis not present

## 2022-04-17 DIAGNOSIS — Z881 Allergy status to other antibiotic agents status: Secondary | ICD-10-CM | POA: Diagnosis not present

## 2022-04-17 DIAGNOSIS — E1140 Type 2 diabetes mellitus with diabetic neuropathy, unspecified: Secondary | ICD-10-CM | POA: Diagnosis not present

## 2022-04-17 DIAGNOSIS — R54 Age-related physical debility: Secondary | ICD-10-CM | POA: Diagnosis not present

## 2022-04-17 DIAGNOSIS — M546 Pain in thoracic spine: Secondary | ICD-10-CM | POA: Diagnosis not present

## 2022-04-17 DIAGNOSIS — J439 Emphysema, unspecified: Secondary | ICD-10-CM | POA: Diagnosis not present

## 2022-04-17 DIAGNOSIS — Z885 Allergy status to narcotic agent status: Secondary | ICD-10-CM | POA: Diagnosis not present

## 2022-04-17 DIAGNOSIS — E785 Hyperlipidemia, unspecified: Secondary | ICD-10-CM | POA: Diagnosis not present

## 2022-04-17 DIAGNOSIS — R739 Hyperglycemia, unspecified: Secondary | ICD-10-CM | POA: Diagnosis not present

## 2022-04-17 DIAGNOSIS — Z91041 Radiographic dye allergy status: Secondary | ICD-10-CM | POA: Diagnosis not present

## 2022-04-17 DIAGNOSIS — Z20822 Contact with and (suspected) exposure to covid-19: Secondary | ICD-10-CM | POA: Diagnosis not present

## 2022-04-17 DIAGNOSIS — E1122 Type 2 diabetes mellitus with diabetic chronic kidney disease: Secondary | ICD-10-CM | POA: Diagnosis not present

## 2022-04-17 DIAGNOSIS — Z7982 Long term (current) use of aspirin: Secondary | ICD-10-CM | POA: Diagnosis not present

## 2022-04-17 DIAGNOSIS — M1990 Unspecified osteoarthritis, unspecified site: Secondary | ICD-10-CM | POA: Diagnosis not present

## 2022-04-17 DIAGNOSIS — R9389 Abnormal findings on diagnostic imaging of other specified body structures: Secondary | ICD-10-CM | POA: Diagnosis not present

## 2022-04-17 DIAGNOSIS — Z79899 Other long term (current) drug therapy: Secondary | ICD-10-CM | POA: Diagnosis not present

## 2022-04-17 DIAGNOSIS — R9431 Abnormal electrocardiogram [ECG] [EKG]: Secondary | ICD-10-CM | POA: Diagnosis not present

## 2022-04-17 DIAGNOSIS — F17210 Nicotine dependence, cigarettes, uncomplicated: Secondary | ICD-10-CM | POA: Diagnosis not present

## 2022-04-17 DIAGNOSIS — Z01818 Encounter for other preprocedural examination: Secondary | ICD-10-CM | POA: Diagnosis not present

## 2022-04-17 DIAGNOSIS — Z7984 Long term (current) use of oral hypoglycemic drugs: Secondary | ICD-10-CM | POA: Diagnosis not present

## 2022-04-17 DIAGNOSIS — I25119 Atherosclerotic heart disease of native coronary artery with unspecified angina pectoris: Secondary | ICD-10-CM | POA: Diagnosis not present

## 2022-04-18 DIAGNOSIS — Z01818 Encounter for other preprocedural examination: Secondary | ICD-10-CM | POA: Diagnosis not present

## 2022-05-08 DIAGNOSIS — I252 Old myocardial infarction: Secondary | ICD-10-CM | POA: Diagnosis not present

## 2022-05-08 DIAGNOSIS — J449 Chronic obstructive pulmonary disease, unspecified: Secondary | ICD-10-CM | POA: Diagnosis not present

## 2022-05-08 DIAGNOSIS — E1122 Type 2 diabetes mellitus with diabetic chronic kidney disease: Secondary | ICD-10-CM | POA: Diagnosis not present

## 2022-05-08 DIAGNOSIS — I10 Essential (primary) hypertension: Secondary | ICD-10-CM | POA: Diagnosis not present

## 2022-05-08 DIAGNOSIS — I2510 Atherosclerotic heart disease of native coronary artery without angina pectoris: Secondary | ICD-10-CM | POA: Diagnosis not present

## 2022-05-08 DIAGNOSIS — E7800 Pure hypercholesterolemia, unspecified: Secondary | ICD-10-CM | POA: Diagnosis not present

## 2022-05-08 DIAGNOSIS — M810 Age-related osteoporosis without current pathological fracture: Secondary | ICD-10-CM | POA: Diagnosis not present

## 2022-05-19 NOTE — Progress Notes (Unsigned)
Synopsis: Referred for COPD by Shirline Frees, MD  Subjective:   PATIENT ID: Alexis Mclean GENDER: female DOB: [birth year], MRN: [id number]  No chief complaint on file.  75yF with history of back pain, HTN, CAD s/p recent high risk PCI referred for COPD  Otherwise pertinent review of systems is negative.  Past Medical History:  Diagnosis Date   Back pain    Depression    Fracture of T11 vertebra ([place]) may 2015   Fracture of T8 vertebra ([place]) 12/2013   Hyperlipemia    Hypertension    Neuropathic pain of chest      Family History  Problem Relation Age of Onset   Stroke Mother    Diabetes Mother    Depression Mother    Diabetes Father    Emphysema Father    Cancer Father        Lung   Asthma Father    Diabetes Sister    Diabetes Brother      Past Surgical History:  Procedure Laterality Date   ABDOMINAL HYSTERECTOMY     APPENDECTOMY     BACK SURGERY  03/2013   fusion   BLADDER REPAIR     salpingoorrphorectomy Right    TONSILLECTOMY     WISDOM TOOTH EXTRACTION      Social History   Socioeconomic History   Marital status: Divorced    Spouse name: Not on file   Number of children: Not on file   Years of education: Not on file   Highest education level: Not on file  Occupational History   Not on file  Tobacco Use   Smoking status: Every Day    Packs/day: 1.33    Years: 30.00    Total pack years: 39.90    Types: Cigarettes    Last attempt to quit: 03/26/2014    Years since quitting: 8.1   Smokeless tobacco: Never  Substance and Sexual Activity   Alcohol use: No   Drug use: No   Sexual activity: Not on file  Other Topics Concern   Not on file  Social History Narrative   Pt lives at home alone. She is divorced, has two children, works part-time, and has a [employer] education level. She drinks 2-3 cups of caffeine daily.    Social Determinants of Health   Financial Resource Strain: Not on file  Food Insecurity: Not on file  Transportation Needs: Not on  file  Physical Activity: Not on file  Stress: Not on file  Social Connections: Not on file  Intimate Partner Violence: Not on file     Allergies  Allergen Reactions   Morphine And Related Other (See Comments)    hypotension   Fluocinolone Other (See Comments)   Latex Rash   Neurontin [Gabapentin] Other (See Comments)    dizziness   Penicillins Diarrhea and Other (See Comments)    Cramping in abdomen   Sulfa Drugs Cross Reactors Nausea Only    Really sick feeling   Xanax [Alprazolam] Other (See Comments)    insomnia   Ivp Dye [Iodinated Contrast Media]     Red/hot   Lyrica [Pregabalin] Other (See Comments)    unknown     Outpatient Medications Prior to Visit  Medication Sig Dispense Refill   amLODipine (NORVASC) 10 MG tablet 10 mg every evening. Takes half daily  1   amLODipine (NORVASC) 5 MG tablet Take 5 mg by mouth daily.     Calcium 600-200 MG-UNIT tablet Take 1 tablet by mouth  daily.     clonazePAM (KLONOPIN) 0.5 MG tablet 0.5 mg at bedtime.      diclofenac sodium (VOLTAREN) 1 % GEL APPLY TO AFFECTED AREA FOUR TIMES A DAY AS NEEDED (TRANSDERMAL)  0   INCRUSE ELLIPTA 62.5 MCG/INH AEPB as needed.  11   irbesartan (AVAPRO) 300 MG tablet Take 1 tablet by mouth daily.     lidocaine (XYLOCAINE) 5 % ointment Apply 1 application topically as needed.     liver oil-zinc oxide (DESITIN) 40 % ointment Apply 1 application topically as needed for irritation.     Lysine 500 MG TABS Take by mouth daily.     MAGNESIUM PO Take 500 mg by mouth daily.     neomycin-polymyxin-hydrocortisone (CORTISPORIN) OTIC solution Apply 1-2 drops to toe after soaking BID 10 mL 1   rosuvastatin (CRESTOR) 10 MG tablet      vitamin C (ASCORBIC ACID) 500 MG tablet Take 500 mg by mouth daily.     XTAMPZA ER 13.5 MG C12A Take 1 capsule by mouth 2 (two) times a day.     No facility-administered medications prior to visit.       Objective:   Physical Exam:  General appearance: 76 y.o., female, NAD,  conversant  Eyes: anicteric sclerae; PERRL, tracking appropriately HENT: NCAT; MMM Neck: Trachea midline; no lymphadenopathy, no JVD Lungs: CTAB, no crackles, no wheeze, with normal respiratory effort CV: RRR, no murmur  Abdomen: Soft, non-tender; non-distended, BS present  Extremities: No peripheral edema, warm Skin: Normal turgor and texture; no rash Psych: Appropriate affect Neuro: Alert and oriented to person and place, no focal deficit     There were no vitals filed for this visit.   on *** LPM *** RA BMI Readings from Last 3 Encounters:  12/17/20 22.99 kg/m  05/10/19 28.36 kg/m  04/28/19 29.45 kg/m   Wt Readings from Last 3 Encounters:  12/17/20 110 lb (49.9 kg)  05/10/19 135 lb 11.2 oz (61.6 kg)  04/28/19 140 lb 14.4 oz (63.9 kg)     CBC    Component Value Date/Time   WBC 13.5 (H) 12/17/2020 1435   RBC 5.95 (H) 12/17/2020 1435   HGB 17.1 (H) 12/17/2020 1435   HCT 52.2 (H) 12/17/2020 1435   PLT 350 12/17/2020 1435   MCV 87.7 12/17/2020 1435   MCH 28.7 12/17/2020 1435   MCHC 32.8 12/17/2020 1435   RDW 12.8 12/17/2020 1435   LYMPHSABS 1.6 12/17/2020 1435   MONOABS 0.8 12/17/2020 1435   EOSABS 0.1 12/17/2020 1435   BASOSABS 0.1 12/17/2020 1435    ***  Chest Imaging: CXR 12/17/20 reviewed by me remarkable for diaphragm flattening and hyperinflation  Pulmonary Functions Testing Results:     No data to display          (ABNORMAL) Diffusion Studies (04/18/2022 5:16 PM EDT) Results - (ABNORMAL) Diffusion Studies (04/18/2022 5:16 PM EDT) Component Value Ref Range Test Method Analysis Time Performed At Pathologist Signature  DLCO PRE 5.49 (L) 11.46 - 21.07 ml/(min*mmHg)   04/18/2022 7:19 PM EDT [facility]    DLCO/VA POST 2.91 (L) 3.27 - 5.53 ml/(min*mmHg*L)   04/18/2022 7:19 PM EDT [facility]    VA PRE 1.89 (L) 2.83 - 4.49 L   04/18/2022 7:19 PM EDT [facility]    IVC PRE 1.20 (L) 1.50 - 2.76 L   04/18/2022 7:19 PM EDT [facility]    BHT POST 10.20 sec   04/18/2022  7:19 PM EDT [facility]    FVC  PRE 1.44 (L) 1.50 - 2.76 L   04/18/2022 7:19 PM EDT [facility]    FEV1 PRE 0.77 (L) 1.16 - 2.09 L   04/18/2022 7:19 PM EDT [facility]    FEV1/FVC PRE 53.26 (L) 63.82 - 90.59 %   04/18/2022 7:19 PM EDT [facility]    FEF25-75% PRE 0.27 (L) 0.62 - 2.64 L/s   04/18/2022 7:19 PM EDT [facility]    TSV/XBL39 pre 16.39 %   04/18/2022 7:19 PM EDT [facility]    PEF PRE 1.77 (L) 2.87 - 5.68 L/s   04/18/2022 7:19 PM EDT [facility]      Echocardiogram:   TTE 04/18/22:   1. The left ventricle is normal in size with normal wall thickness.    2. The left ventricular systolic function is normal, LVEF is visually  estimated at 55-60%.    3. There is hypokinesis of the inferior wall(s).    4. The right ventricle is normal in size, with normal systolic function.    5. There are no significant valvular abnormalities.   Heart Catheterization 04/22/22:  Findings:  Successful percutaneous coronary intervention to the ostial LAD with 1  Xience Skypoint drug-eluting stent with 0% residual stenosis and TIMI-3  flow.  Successful percutaneous intervention to the OM1 with a Xience Skypoint  drug-eluting stent with 0% residual stenosis and TIMI-3 flow  Normal left ventricular end diastolic pressure of 0-30SPQZ.  No gradient across aortic valve.     Assessment & Plan:    Plan:      Alexis Hurter, MD [facility] 05/19/2022 10:51 AM

## 2022-05-21 ENCOUNTER — Encounter: Payer: Self-pay | Admitting: Student

## 2022-05-21 ENCOUNTER — Ambulatory Visit (INDEPENDENT_AMBULATORY_CARE_PROVIDER_SITE_OTHER): Payer: Medicare Other | Admitting: Student

## 2022-05-21 VITALS — BP 116/76 | HR 72 | Temp 98.2°F | Ht <= 58 in | Wt 126.4 lb

## 2022-05-21 DIAGNOSIS — F17200 Nicotine dependence, unspecified, uncomplicated: Secondary | ICD-10-CM

## 2022-05-21 DIAGNOSIS — J449 Chronic obstructive pulmonary disease, unspecified: Secondary | ICD-10-CM | POA: Diagnosis not present

## 2022-05-21 DIAGNOSIS — J9811 Atelectasis: Secondary | ICD-10-CM

## 2022-05-21 NOTE — Patient Instructions (Addendum)
-   Please reach out to medical records at [facility] to get disc with your CT Chest on it and try to bring it to your next visit - You will be called to schedule CT CHest in 2 weeks - trelegy 1 puff once daily - Try mucinex 600 mg to 1200 mg twice daily for a few days - after trelegy try flutter valve 10 slow but firm puffs through it to see if it helps you cough up phlegm - see you in clinic afterward to discuss

## 2022-05-30 ENCOUNTER — Ambulatory Visit: Payer: Medicare Other | Admitting: Cardiology

## 2022-06-04 ENCOUNTER — Ambulatory Visit
Admission: RE | Admit: 2022-06-04 | Discharge: 2022-06-04 | Disposition: A | Discharge status: Home / Self Care | Payer: Medicare Other | Source: Ambulatory Visit | Attending: Student | Admitting: Student

## 2022-06-04 DIAGNOSIS — I700 Atherosclerosis of aorta: Secondary | ICD-10-CM | POA: Diagnosis not present

## 2022-06-04 DIAGNOSIS — J9811 Atelectasis: Secondary | ICD-10-CM | POA: Diagnosis not present

## 2022-06-04 DIAGNOSIS — J398 Other specified diseases of upper respiratory tract: Secondary | ICD-10-CM | POA: Diagnosis not present

## 2022-06-04 DIAGNOSIS — J9809 Other diseases of bronchus, not elsewhere classified: Secondary | ICD-10-CM | POA: Diagnosis not present

## 2022-06-10 NOTE — Progress Notes (Unsigned)
Synopsis: Referred for COPD by Shirline Frees, MD  Subjective:   PATIENT ID: Alexis Mclean GENDER: female DOB: 24-Aug-1946, MRN: [id number]  No chief complaint on file.  75yF with history of back pain, HTN, CAD s/p recent high risk PCI referred for COPD  She was admitted 04/17/22-04/23/22 for aspiration and NSTEMI s/p high risk PCI  She says she had CT when she was there that showed RBI endobronchial debris. She doesn't recall any macroaspiration event. She occasionally has trouble swallowing food/liquid. She thinks it's due to something in her neck - she has an easier time swallowing when she turns her head to the left. She has had a neck injury in the past. She doesn't have a cough now. She is taking trelegy 1 puff once daily, rinsing mouth afterward. No courses of prednisone this year for COPD.   Otherwise pertinent review of systems is negative.  Her dad had emphysema and then had lung cancer. He was a Glass blower/designer. Sister had lung cancer.  She has smoked for 30-40 py smoker down to 3 cigarettes/day. She worked as a Geophysicist/field seismologist. No mj, vaping.   Interval HPI: At last visit worked on airway clearance, repeat CT Chest apparently with improved patency of RLL/RBI but persistent RML atelectasis.   Past Medical History:  Diagnosis Date   Anxiety    Back pain    Cataract of both eyes    Chronic back pain    Chronic kidney disease, stage 3 ([name])    Chronic pain syndrome    COPD (chronic obstructive pulmonary disease) ([name])    Depression    DM (diabetes mellitus) ([place])    Fracture of T11 vertebra ([place]) 03/2014   Fracture of T8 vertebra ([place]) 12/2013   Hypercholesterolemia    Hyperlipemia    Hypertension    Insomnia    Mononeuropathy    Neuropathic pain of chest    Osteoarthritis    Osteoporosis    PAD (peripheral artery disease) ([place])    Status post myocardial infarction      Family History  Problem Relation Age of Onset   Stroke Mother    Diabetes Mother     Depression Mother    Diabetes Father    Emphysema Father    Cancer Father        Lung   Asthma Father    Diabetes Sister    Diabetes Brother      Past Surgical History:  Procedure Laterality Date   ABDOMINAL HYSTERECTOMY     APPENDECTOMY     BACK SURGERY  03/2013   fusion   BLADDER REPAIR     salpingoorrphorectomy Right    TONSILLECTOMY     WISDOM TOOTH EXTRACTION      Social History   Socioeconomic History   Marital status: Divorced    Spouse name: Not on file   Number of children: Not on file   Years of education: Not on file   Highest education level: Not on file  Occupational History   Not on file  Tobacco Use   Smoking status: Every Day    Packs/day: 0.25    Years: 30.00    Total pack years: 7.50    Types: Cigarettes    Passive exposure: Past   Smokeless tobacco: Never   Tobacco comments:    Pt smokes 3 ciggs on average a day  Substance and Sexual Activity   Alcohol use: No   Drug use: No   Sexual activity: Not on file  Other Topics Concern   Not on file  Social History Narrative   Pt lives at home alone. She is divorced, has two children, works part-time, and has a [employer] education level. She drinks 2-3 cups of caffeine daily.    Social Determinants of Health   Financial Resource Strain: Not on file  Food Insecurity: Not on file  Transportation Needs: Not on file  Physical Activity: Not on file  Stress: Not on file  Social Connections: Not on file  Intimate Partner Violence: Not on file     Allergies  Allergen Reactions   Morphine And Related Other (See Comments)    hypotension   Fluocinolone Other (See Comments)   Latex Rash   Neurontin [Gabapentin] Other (See Comments)    dizziness   Penicillins Diarrhea and Other (See Comments)    Cramping in abdomen   Sulfa Drugs Cross Reactors Nausea Only    Really sick feeling   Xanax [Alprazolam] Other (See Comments)    insomnia   Codeine Swelling    Pt states her body jerks after taking an swelling    Ivp Dye [Iodinated Contrast Media]     Red/hot   Lyrica [Pregabalin] Other (See Comments)    unknown     Outpatient Medications Prior to Visit  Medication Sig Dispense Refill   amLODipine (NORVASC) 10 MG tablet 10 mg every evening. Takes half daily  1   amLODipine (NORVASC) 5 MG tablet Take 5 mg by mouth daily.     Calcium 600-200 MG-UNIT tablet Take 1 tablet by mouth daily.     clonazePAM (KLONOPIN) 0.5 MG tablet 0.5 mg at bedtime.      diclofenac sodium (VOLTAREN) 1 % GEL APPLY TO AFFECTED AREA FOUR TIMES A DAY AS NEEDED (TRANSDERMAL)  0   INCRUSE ELLIPTA 62.5 MCG/INH AEPB as needed.  11   irbesartan (AVAPRO) 300 MG tablet Take 1 tablet by mouth daily.     lidocaine (XYLOCAINE) 5 % ointment Apply 1 application topically as needed.     liver oil-zinc oxide (DESITIN) 40 % ointment Apply 1 application topically as needed for irritation.     Lysine 500 MG TABS Take by mouth daily.     MAGNESIUM PO Take 500 mg by mouth daily.     neomycin-polymyxin-hydrocortisone (CORTISPORIN) OTIC solution Apply 1-2 drops to toe after soaking BID 10 mL 1   rosuvastatin (CRESTOR) 10 MG tablet      vitamin C (ASCORBIC ACID) 500 MG tablet Take 500 mg by mouth daily.     XTAMPZA ER 13.5 MG C12A Take 1 capsule by mouth 2 (two) times a day.     No facility-administered medications prior to visit.       Objective:   Physical Exam:  General appearance: 76 y.o., female, NAD, conversant  Eyes: anicteric sclerae; PERRL, tracking appropriately HENT: NCAT; MMM Neck: Trachea midline; no lymphadenopathy, no JVD Lungs: diminshed right lower lung field, with normal respiratory effort, kyphotic CV: RRR, no murmur  Abdomen: Soft, non-tender; non-distended, BS present  Extremities: No peripheral edema, warm Skin: Normal turgor and texture; no rash Psych: Appropriate affect Neuro: Alert and oriented to person and place, no focal deficit     There were no vitals filed for this visit.    on' \\RA'$  BMI  Readings from Last 3 Encounters:  05/21/22 26.42 kg/m  12/17/20 22.99 kg/m  05/10/19 28.36 kg/m   Wt Readings from Last 3 Encounters:  05/21/22 126 lb 6.4 oz (57.3 kg)  12/17/20 110  lb (49.9 kg)  05/10/19 135 lb 11.2 oz (61.6 kg)     CBC    Component Value Date/Time   WBC 13.5 (H) 12/17/2020 1435   RBC 5.95 (H) 12/17/2020 1435   HGB 17.1 (H) 12/17/2020 1435   HCT 52.2 (H) 12/17/2020 1435   PLT 350 12/17/2020 1435   MCV 87.7 12/17/2020 1435   MCH 28.7 12/17/2020 1435   MCHC 32.8 12/17/2020 1435   RDW 12.8 12/17/2020 1435   LYMPHSABS 1.6 12/17/2020 1435   MONOABS 0.8 12/17/2020 1435   EOSABS 0.1 12/17/2020 1435   BASOSABS 0.1 12/17/2020 1435      Chest Imaging: CXR 12/17/20 reviewed by me remarkable for diaphragm flattening and hyperinflation  CT 04/17/22 OSH report reviewed by me: Endobronchial material in the distal bronchus intermedius as well as proximal right middle lobe and right lower lobe bronchial branches likely due to aspiration. Partial right middle lobe atelectasis with bronchiectasis may be related to chronic aspiration.  CT Chest8/2/23 reviewed by me with patent RBI, RLL, limited endobronchial debris, total RML atelectasis, a few foci of scar in lung bases  Pulmonary Functions Testing Results:     No data to display          (ABNORMAL) Diffusion Studies (04/18/2022 5:16 PM EDT) Results - (ABNORMAL) Diffusion Studies (04/18/2022 5:16 PM EDT) Component Value Ref Range Test Method Analysis Time Performed At Pathologist Signature  DLCO PRE 5.49 (L) 11.46 - 21.07 ml/(min*mmHg)   04/18/2022 7:19 PM EDT [facility]    DLCO/VA POST 2.91 (L) 3.27 - 5.53 ml/(min*mmHg*L)   04/18/2022 7:19 PM EDT [facility]    VA PRE 1.89 (L) 2.83 - 4.49 L   04/18/2022 7:19 PM EDT [facility]    IVC PRE 1.20 (L) 1.50 - 2.76 L   04/18/2022 7:19 PM EDT [facility]    BHT POST 10.20 sec   04/18/2022 7:19 PM EDT [facility]    FVC PRE 1.44 (L) 1.50 - 2.76 L   04/18/2022 7:19 PM EDT [facility]    FEV1  PRE 0.77 (L) 1.16 - 2.09 L   04/18/2022 7:19 PM EDT [facility]    FEV1/FVC PRE 53.26 (L) 63.82 - 90.59 %   04/18/2022 7:19 PM EDT [facility]    FEF25-75% PRE 0.27 (L) 0.62 - 2.64 L/s   04/18/2022 7:19 PM EDT [facility]    ZOX/WRU04 pre 16.39 %   04/18/2022 7:19 PM EDT [facility]    PEF PRE 1.77 (L) 2.87 - 5.68 L/s   04/18/2022 7:19 PM EDT [facility]      Echocardiogram:   TTE 04/18/22:   1. The left ventricle is normal in size with normal wall thickness.    2. The left ventricular systolic function is normal, LVEF is visually  estimated at 55-60%.    3. There is hypokinesis of the inferior wall(s).    4. The right ventricle is normal in size, with normal systolic function.    5. There are no significant valvular abnormalities.   Heart Catheterization 04/22/22:  Findings:  Successful percutaneous coronary intervention to the ostial LAD with 1  Xience Skypoint drug-eluting stent with 0% residual stenosis and TIMI-3  flow.  Successful percutaneous intervention to the OM1 with a Xience Skypoint  drug-eluting stent with 0% residual stenosis and TIMI-3 flow  Normal left ventricular end diastolic pressure of 5-40JWJX.  No gradient across aortic valve.     Assessment & Plan:   # COPD gold B  #  RML atelectasis, RBI occlusion # Possible RML bronchiectasis Sometimes has trouble swallowing and attributes it to neck issues from spondylolisthesis  # Smoking: Smoking cessation encouraged. She's working on it on her own. Would qualify for lung cancer screening if this repeat CT doesn't show anything concerning.  Plan: - repeat CT Chest in 1-2 weeks - trelegy 1 puff once daily - albuterol prn - try mucinex 600 mg to 1200 mg twice daily for a few days - after trelegy try flutter valve 10 slow but firm puffs through it to see if it helps you cough up phlegm   Will discuss ct results in 3 weeks in clinic     Maryjane Hurter, MD [facility] 06/10/2022 6:05 PM

## 2022-06-11 ENCOUNTER — Ambulatory Visit (INDEPENDENT_AMBULATORY_CARE_PROVIDER_SITE_OTHER): Payer: Medicare Other | Admitting: Student

## 2022-06-11 ENCOUNTER — Encounter: Payer: Self-pay | Admitting: Student

## 2022-06-11 VITALS — BP 114/58 | HR 76 | Temp 98.0°F | Ht <= 58 in | Wt 130.0 lb

## 2022-06-11 DIAGNOSIS — J449 Chronic obstructive pulmonary disease, unspecified: Secondary | ICD-10-CM | POA: Diagnosis not present

## 2022-06-11 DIAGNOSIS — F17210 Nicotine dependence, cigarettes, uncomplicated: Secondary | ICD-10-CM | POA: Diagnosis not present

## 2022-06-11 DIAGNOSIS — F17200 Nicotine dependence, unspecified, uncomplicated: Secondary | ICD-10-CM

## 2022-06-11 MED ORDER — STIOLTO RESPIMAT 2.5-2.5 MCG/ACT IN AERS: 2.0000 | INHALATION_SPRAY | Freq: Every day | RESPIRATORY_TRACT | 0 refills | Status: DC

## 2022-06-11 MED ORDER — VARENICLINE TARTRATE 0.5 MG X 11 & 1 MG X 42 PO TBPK: ORAL_TABLET | ORAL | 0 refills | Status: AC

## 2022-06-11 MED ORDER — VARENICLINE TARTRATE 1 MG PO TABS: 1.0000 mg | ORAL_TABLET | Freq: Two times a day (BID) | ORAL | 1 refills | Status: AC

## 2022-06-11 NOTE — Patient Instructions (Addendum)
-   chantix: Take one 0.5 mg tablet by mouth once daily for 3 days, then increase to one 0.5 mg tablet twice daily for 4 days, then increase to one 1 mg tablet twice daily. Can smoke during the first week then ideally stop. If nightmares then skip evening dose.  - try stiolto 2 puffs once daily in the morning. If you feel less short of breath with this than you do with trelegy then send me a message  - referral placed for lung cancer screening - see you in 6 weeks to follow up on smoking

## 2022-06-13 DIAGNOSIS — N390 Urinary tract infection, site not specified: Secondary | ICD-10-CM | POA: Diagnosis not present

## 2022-06-19 ENCOUNTER — Other Ambulatory Visit: Payer: Medicare Other

## 2022-06-19 ENCOUNTER — Telehealth: Payer: Self-pay | Admitting: Student

## 2022-06-19 MED ORDER — INCRUSE ELLIPTA 62.5 MCG/ACT IN AEPB: 1.0000 | INHALATION_SPRAY | Freq: Every day | RESPIRATORY_TRACT | 11 refills | Status: AC

## 2022-06-19 NOTE — Telephone Encounter (Signed)
Patient called to let the doctor know that the inhaler, Stiolto is making her mouth and throat burn and she does not want to use it any longer.  She stated that she has the old inhaler, incruse and would like to know if she can use that one instead.  Please advise.

## 2022-06-19 NOTE — Telephone Encounter (Signed)
Called patient and she states that she started the Stiolto inhaler on the 9th and ever since she started using the inhaler it has been burning her mouth and throat. She states that she would like to stop this inhaler and go back to her old inhaler the Incruse.   Please advise sir on recommendations

## 2022-06-23 ENCOUNTER — Encounter: Payer: Self-pay | Admitting: Cardiovascular Disease

## 2022-06-23 ENCOUNTER — Ambulatory Visit (INDEPENDENT_AMBULATORY_CARE_PROVIDER_SITE_OTHER): Payer: Medicare Other | Admitting: Cardiovascular Disease

## 2022-06-23 VITALS — BP 134/78 | HR 61 | Ht <= 58 in | Wt 129.4 lb

## 2022-06-23 DIAGNOSIS — I2510 Atherosclerotic heart disease of native coronary artery without angina pectoris: Secondary | ICD-10-CM

## 2022-06-23 DIAGNOSIS — E782 Mixed hyperlipidemia: Secondary | ICD-10-CM

## 2022-06-23 MED ORDER — ASPIRIN 81 MG PO TBEC: 81.0000 mg | DELAYED_RELEASE_TABLET | Freq: Every day | ORAL | 3 refills | Status: AC

## 2022-06-23 MED ORDER — DAPAGLIFLOZIN PROPANEDIOL 10 MG PO TABS: 10.0000 mg | ORAL_TABLET | Freq: Every day | ORAL | 3 refills | Status: DC

## 2022-06-23 NOTE — Patient Instructions (Signed)
Medication Instructions:  START Farxiga '10mg'$  daily Continue Aspirin '81mg'$  daily HOLD Rosuvastatin for 2 weeks, then see lipid clinic *If you need a refill on your cardiac medications before your next appointment, please call your pharmacy*   Lab Work: NONE If you have labs (blood work) drawn today and your tests are completely normal, you will receive your results only by: [place] (if you have MyChart) OR A paper copy in the mail If you have any lab test that is abnormal or we need to change your treatment, we will call you to review the results.   Testing/Procedures: Ambulatory Referral to Lipid Clinic (PharmD)  ECHO ( in 3 months) Your physician has requested that you have an echocardiogram. Echocardiography is a painless test that uses sound waves to create images of your heart. It provides your doctor with information about the size and shape of your heart and how well your heart's chambers and valves are working. This procedure takes approximately one hour. There are no restrictions for this procedure.   Follow-Up: At [facility], you and your health needs are our priority.  As part of our continuing mission to provide you with exceptional heart care, we have created designated Provider Care Teams.  These Care Teams include your primary Cardiologist (physician) and Advanced Practice Providers (APPs -  Physician Assistants and Nurse Practitioners) who all work together to provide you with the care you need, when you need it.  We recommend signing up for the patient portal called "MyChart".  Sign up information is provided on this After Visit Summary.  MyChart is used to connect with patients for Virtual Visits (Telemedicine).  Patients are able to view lab/test results, encounter notes, upcoming appointments, etc.  Non-urgent messages can be sent to your provider as well.   To learn more about what you can do with MyChart, go to [email or web address].    Your next  appointment:   3 month(s) (one week after ECHO)  The format for your next appointment:   In Person  Provider:   Mertie Moores, MD   Other Instructions    Important Information About Sugar

## 2022-06-23 NOTE — Progress Notes (Signed)
Cardiology Office Note:    Date:  06/23/2022   ID:  Alexis Mclean, DOB 02-21-1946, MRN [id number]  PCP:  Alexis Frees, MD   [facility] [facility] Providers Cardiologist:    Click to update primary MD,subspecialty MD or APP then REFRESH:1}    Referring MD: Alexis Frees, MD   Chief Complaint  Patient presents with   Coronary Artery Disease         History of Present Illness:    Alexis Mclean is a 76 y.o. female with a hx of MI Was treated at [facility] with son in law, Alexis Mclean   Had 2 cardiac stents Felt well upon DC but now is very fatigued  Thinks the fatigue is related to her Coreg.   She was on rosuvastatin twice a week prior to the heart attack.  Following the heart attack she is now taking rosuvastatin 10 mg a day. She has a history of back issues and has neuropathy in her legs.  She seems to be having more hip and muscle pain after increasing her dose of rosuvastatin.  Heart catheterization on June 16: Severe 3-vessel coronary artery disease, including 99% ostial LAD stenosis, 90% ostial D1 stenosis, 90% stenosis in the proximal section of OM1, 90% stenosis in the mid LCx, and occluded RCA which is left-to-right collaterals  The doctors originally recommended bypass surgery.  After much discussion it was determined that she probably was not a good candidate for surgery.  She had a repeat heart catheterization on June 20 with placement of 2 stents.  She had a 3.0 mm stent placed in her proximal LAD, postdilated with a 3.5 mm balloon. She then had a 2.  Two 5 mm DES placed in the OM1.  Still smoking -advised smoking cessation.  HbA1C is 7.9 She did not tolerate diabetic medicines in the past.  We will try her on Farxiga.   Past Medical History:  Diagnosis Date   Anxiety    Back pain    Cataract of both eyes    Chronic back pain    Chronic kidney disease, stage 3 ([name])    Chronic pain syndrome    COPD (chronic obstructive pulmonary disease)  ([name])    Depression    DM (diabetes mellitus) ([place])    Fracture of T11 vertebra ([place]) 03/2014   Fracture of T8 vertebra ([facility]) 12/2013   Hypercholesterolemia    Hyperlipemia    Hypertension    Insomnia    Mononeuropathy    Neuropathic pain of chest    Osteoarthritis    Osteoporosis    PAD (peripheral artery disease) ([place])    Status post myocardial infarction     Past Surgical History:  Procedure Laterality Date   ABDOMINAL HYSTERECTOMY     APPENDECTOMY     BACK SURGERY  03/2013   fusion   BLADDER REPAIR     salpingoorrphorectomy Right    TONSILLECTOMY     WISDOM TOOTH EXTRACTION      Current Medications: Current Meds  Medication Sig   amLODipine (NORVASC) 5 MG tablet Take 5 mg by mouth daily.   aspirin EC 81 MG tablet Take 1 tablet (81 mg total) by mouth daily. Swallow whole.   carvedilol (COREG) 3.125 MG tablet Take 3.125 mg by mouth 2 (two) times daily.   cephALEXin (KEFLEX) 500 MG capsule Take 500 mg by mouth 3 (three) times daily.   cholecalciferol (VITAMIN D3) 25 MCG (1000 UNIT) tablet Take 1,000 Units by mouth  daily.   clonazePAM (KLONOPIN) 0.5 MG tablet 0.5 mg at bedtime.    clopidogrel (PLAVIX) 75 MG tablet Take 75 mg by mouth daily.   dapagliflozin propanediol (FARXIGA) 10 MG TABS tablet Take 1 tablet (10 mg total) by mouth daily before breakfast.   diclofenac sodium (VOLTAREN) 1 % GEL APPLY TO AFFECTED AREA FOUR TIMES A DAY AS NEEDED (TRANSDERMAL)   DULoxetine (CYMBALTA) 30 MG capsule Take 1 capsule by mouth daily.   irbesartan (AVAPRO) 300 MG tablet Take 1 tablet by mouth daily.   lidocaine (XYLOCAINE) 5 % ointment Apply 1 application topically as needed.   loratadine (CLARITIN) 10 MG tablet Take 1 tablet by mouth daily.   Lysine 500 MG TABS Take by mouth daily.   methenamine (HIPREX) 1 g tablet Take 1 g by mouth daily.   nicotine polacrilex (NICORETTE) 2 MG lozenge 1 lozenge as needed   nitroGLYCERIN (NITROSTAT) 0.4 MG SL tablet Place under the tongue. Place  under the tongue.   rosuvastatin (CRESTOR) 10 MG tablet    umeclidinium bromide (INCRUSE ELLIPTA) 62.5 MCG/ACT AEPB Inhale 1 puff into the lungs daily.   vitamin C (ASCORBIC ACID) 500 MG tablet Take 500 mg by mouth daily.   XTAMPZA ER 13.5 MG C12A Take 1 capsule by mouth 2 (two) times a day.     Allergies:   Morphine and related, Fluocinolone, Latex, Neurontin [gabapentin], Penicillins, Sulfa drugs cross reactors, Xanax [alprazolam], Codeine, Ivp dye [iodinated contrast media], and Lyrica [pregabalin]   Social History   Socioeconomic History   Marital status: Divorced    Spouse name: Not on file   Number of children: Not on file   Years of education: Not on file   Highest education level: Not on file  Occupational History   Not on file  Tobacco Use   Smoking status: Every Day    Packs/day: 1.00    Years: 62.00    Total pack years: 62.00    Types: Cigarettes    Passive exposure: Past   Smokeless tobacco: Never   Tobacco comments:    1/2 ppd Alexis Mclean, [facility] 06/11/22   Vaping Use   Vaping Use: Never used  Substance and Sexual Activity   Alcohol use: No   Drug use: No   Sexual activity: Not on file  Other Topics Concern   Not on file  Social History Narrative   Pt lives at home alone. She is divorced, has two children, works part-time, and has a [employer] education level. She drinks 2-3 cups of caffeine daily.    Social Determinants of Health   Financial Resource Strain: Not on file  Food Insecurity: Not on file  Transportation Needs: Not on file  Physical Activity: Not on file  Stress: Not on file  Social Connections: Not on file     Family History: The patient's family history includes Asthma in her father; Cancer in her father; Depression in her mother; Diabetes in her brother, father, mother, and sister; Emphysema in her father; Stroke in her mother.  ROS:   Please see the history of present illness.     All other systems reviewed and are  negative.  EKGs/Labs/Other Studies Reviewed:    The following studies were reviewed today:   EKG: June 23, 2022: Normal sinus rhythm at 61.  Septal myocardial infarction.  T wave inversions in lead V4 through V6.  Recent Labs: No results found for requested labs within last 365 days.  Recent Lipid Panel    Component  Value Date/Time   CHOL 307 (A) 05/20/2018 0000   TRIG 379 (A) 05/20/2018 0000   HDL 1 (A) 05/20/2018 0000   LDLCALC 193 05/20/2018 0000     Risk Assessment/Calculations:               Physical Exam:    VS:  BP 134/78   Pulse 61   Ht '4\' 10"'$  (1.473 m)   Wt 129 lb 6.4 oz (58.7 kg)   SpO2 97%   BMI 27.04 kg/m     Wt Readings from Last 3 Encounters:  06/23/22 129 lb 6.4 oz (58.7 kg)  06/11/22 130 lb (59 kg)  05/21/22 126 lb 6.4 oz (57.3 kg)     GEN:  Well nourished, well developed in no acute distress HEENT: Normal NECK: No JVD; No carotid bruits LYMPHATICS: No lymphadenopathy CARDIAC: RRR, no murmurs, rubs, gallops RESPIRATORY:  Clear to auscultation without rales, wheezing or rhonchi  ABDOMEN: Soft, non-tender, non-distended MUSCULOSKELETAL:  No edema; No deformity  SKIN: Warm and dry NEUROLOGIC:  Alert and oriented x 3 PSYCHIATRIC:  Normal affect   ASSESSMENT:    1. Coronary artery disease involving native coronary artery of native heart without angina pectoris   2. Mixed hyperlipidemia    PLAN:       Coronary artery disease: She was treated at [facility].  She is status post stenting of her proximal LAD and stenting of her obtuse marginal artery.  She has residual disease in her first diagonal artery that was not stented.  She also has an occluded right coronary artery that was not stented.  She is having lots of bruising on her hands.  Likely this is due to at the aspirin and Plavix.  Of asked her to be patient with [place] and at least take DAPT for 6 months, ideally for 12 months.  She should be careful  with hitting her hands unintentionally.  Unfortunately she continues to smoke.  I strongly advised her to stop smoking.  2.  Hyperlipidemia: The rosuvastatin 10 mg a day seems to be causing lots of muscle and joint pain.  She is not tolerating it at all.  We will have her hold the rosuvastatin for 2 weeks to see if this improves.  We will refer her to the lipid clinic for consideration of a PCSK9 inhibitor.  3.  Diabetes mellitus: Hemoglobin A1c is 7.9.  She has not tolerated diabetic treatments in the past.  Now that she is she is known to have severe coronary artery disease I think that we need to be more aggressive with her diabetic control.  We will try her on Farxiga 10 mg a day.  I will leave further discussion of treatment of her diabetes up to Dr. Kenton Kingfisher.              Medication Adjustments/Labs and Tests Ordered: Current medicines are reviewed at length with the patient today.  Concerns regarding medicines are outlined above.  Orders Placed This Encounter  Procedures   AMB Referral to [facility] Pharm-D   EKG 12-Lead   ECHOCARDIOGRAM COMPLETE   Meds ordered this encounter  Medications   dapagliflozin propanediol (FARXIGA) 10 MG TABS tablet    Sig: Take 1 tablet (10 mg total) by mouth daily before breakfast.    Dispense:  90 tablet    Refill:  3   aspirin EC 81 MG tablet    Sig: Take 1 tablet (81 mg total) by mouth daily.  Swallow whole.    Dispense:  90 tablet    Refill:  3    Patient Instructions  Medication Instructions:  START Farxiga '10mg'$  daily Continue Aspirin '81mg'$  daily HOLD Rosuvastatin for 2 weeks, then see lipid clinic *If you need a refill on your cardiac medications before your next appointment, please call your pharmacy*   Lab Work: NONE If you have labs (blood work) drawn today and your tests are completely normal, you will receive your results only by: [place] (if you have MyChart) OR A paper copy in the mail If you have any lab test that  is abnormal or we need to change your treatment, we will call you to review the results.   Testing/Procedures: Ambulatory Referral to Lipid Clinic (PharmD)  ECHO ( in 3 months) Your physician has requested that you have an echocardiogram. Echocardiography is a painless test that uses sound waves to create images of your heart. It provides your doctor with information about the size and shape of your heart and how well your heart's chambers and valves are working. This procedure takes approximately one hour. There are no restrictions for this procedure.   Follow-Up: At [facility], you and your health needs are our priority.  As part of our continuing mission to provide you with exceptional heart care, we have created designated Provider Care Teams.  These Care Teams include your primary Cardiologist (physician) and Advanced Practice Providers (APPs -  Physician Assistants and Nurse Practitioners) who all work together to provide you with the care you need, when you need it.  We recommend signing up for the patient portal called "MyChart".  Sign up information is provided on this After Visit Summary.  MyChart is used to connect with patients for Virtual Visits (Telemedicine).  Patients are able to view lab/test results, encounter notes, upcoming appointments, etc.  Non-urgent messages can be sent to your provider as well.   To learn more about what you can do with MyChart, go to [email or web address].    Your next appointment:   3 month(s) (one week after ECHO)  The format for your next appointment:   In Person  Provider:   Mertie Moores, MD   Other Instructions    Important Information About Sugar         Signed, Mertie Moores, MD  06/23/2022 11:46 AM    Susan Moore

## 2022-06-27 ENCOUNTER — Telehealth: Payer: Self-pay | Admitting: Student

## 2022-06-27 DIAGNOSIS — N390 Urinary tract infection, site not specified: Secondary | ICD-10-CM | POA: Diagnosis not present

## 2022-07-01 NOTE — Telephone Encounter (Signed)
ATC patient to follow up on Chantix.  LM to call back.

## 2022-07-02 NOTE — Telephone Encounter (Signed)
Patient is returning a call to nurse regarding her Chantix.  She stated again that she cannot take that medication and she is still not smoking.  Would like to know if there is something else she can take.  Please call today if possible because patient will not be at home tomorrow.  CB# [phone]

## 2022-07-03 DIAGNOSIS — I2510 Atherosclerotic heart disease of native coronary artery without angina pectoris: Secondary | ICD-10-CM | POA: Diagnosis not present

## 2022-07-03 DIAGNOSIS — I10 Essential (primary) hypertension: Secondary | ICD-10-CM | POA: Diagnosis not present

## 2022-07-03 DIAGNOSIS — G894 Chronic pain syndrome: Secondary | ICD-10-CM | POA: Diagnosis not present

## 2022-07-03 NOTE — Telephone Encounter (Signed)
Nope, thanks for discussing with her!

## 2022-07-03 NOTE — Telephone Encounter (Signed)
Spoke with the pt  She states that she was taking the chantix 0.5 mg daily- in the am with food  It was causing her to feel groggy and have bad dreams  She has stopped taking this  She hs stopped smoking as of 2 wks ago- congratulated on this!  She has cravings but not overwhelmed by them  She is using the patch and lozenges  She states patch causing her skin to burn  Advised stop patch and use lozenges or gum for cravings  Will forward to NM to see if any other recs, thanks!

## 2022-07-15 ENCOUNTER — Telehealth: Payer: Self-pay | Admitting: Cardiovascular Disease

## 2022-07-15 NOTE — Telephone Encounter (Signed)
Per OV note on 06/23/22: 3.  Diabetes mellitus: Hemoglobin A1c is 7.9.  She has not tolerated diabetic treatments in the past.  Now that she is she is known to have severe coronary artery disease I think that we need to be more aggressive with her diabetic control.  We will try her on Farxiga 10 mg a day.  I will leave further discussion of treatment of her diabetes up to Dr. Kenton Kingfisher.  States since she started taking [place], she developed shortness of breath. States she took it for 1 week because she didn't correlate her symptoms to this, and now believes that it's the [place] causing her SOB. Informed her that the literature I find refers to DKA and SOB, but she states her CBG at home has been "good" and ranging 140's-150's. BP reported at 144/81 and 137/68 (only readings she had). She states that a lot of medications react badly on her. She attests that the SOB seems to be better today, last dose she took was yesterday (half life of drug is 8.9-12.9 hours). Will route to MD for review, but suggested to patient since she has history of recurrent bronchitis, fall allergies, and states she was told at one point that she has COPD, that she may best benefit from holding the medication for 1 week, then re-trialing it to see if same symptoms recur. She states she'd be willing to if that's what MD recommends.

## 2022-07-15 NOTE — Telephone Encounter (Signed)
Pt c/o medication issue:  1. Name of Medication: dapagliflozin propanediol (FARXIGA) 10 MG TABS tablet  2. How are you currently taking this medication (dosage and times per day)? 1 tablet daily  3. Are you having a reaction (difficulty breathing--STAT)? no  4. What is your medication issue? Patient states she had to stop taking the medication, because she was getting SOB while taking it. She says every day she took it her SOB got worse. She says she did not take it today and feels better.

## 2022-07-17 NOTE — Telephone Encounter (Signed)
Called and spoke with patient. She states that she will remain off the [place] for now. Spoke with Dr Kenton Kingfisher, her PCP, who advised her to "just stop taking it and try to eat better and be better with my diet." Asked pt again to consider re-trialing it in a week to confirm that she truly is having dyspnea from the medication. She states that she will and will call back to let me know either way so that I can update her chart if necessary. Wilder Glade removed from medication list for now.

## 2022-07-29 NOTE — Progress Notes (Deleted)
Synopsis: Referred for COPD by Shirline Frees, MD  Subjective:   PATIENT ID: Alexis Mclean GENDER: female DOB: 23-Mar-1946, MRN: [id number]  No chief complaint on file.  75yF with history of back pain, HTN, CAD s/p recent high risk PCI referred for COPD  She was admitted 04/17/22-04/23/22 for aspiration and NSTEMI s/p high risk PCI  She says she had CT when she was there that showed RBI endobronchial debris. She doesn't recall any macroaspiration event. She occasionally has trouble swallowing food/liquid. She thinks it's due to something in her neck - she has an easier time swallowing when she turns her head to the left. She has had a neck injury in the past. She doesn't have a cough now. She is taking trelegy 1 puff once daily, rinsing mouth afterward. No courses of prednisone this year for COPD.   Her dad had emphysema and then had lung cancer. He was a Glass blower/designer. Sister had lung cancer.  She has smoked for 30-40 py smoker down to 3 cigarettes/day. She worked as a Geophysicist/field seismologist. No mj, vaping.   Interval HPI: Tried stiolto but too much dry mouth, back to incruse  Nightmares with chantix, irritation with patch, continued gum.   She thinks farxiga caused dyspnea.  Otherwise pertinent review of systems is negative.  Past Medical History:  Diagnosis Date   Anxiety    Back pain    Cataract of both eyes    Chronic back pain    Chronic kidney disease, stage 3 ([name])    Chronic pain syndrome    COPD (chronic obstructive pulmonary disease) ([name])    Depression    DM (diabetes mellitus) ([place])    Fracture of T11 vertebra ([place]) 03/2014   Fracture of T8 vertebra ([place]) 12/2013   Hypercholesterolemia    Hyperlipemia    Hypertension    Insomnia    Mononeuropathy    Neuropathic pain of chest    Osteoarthritis    Osteoporosis    PAD (peripheral artery disease) ([place])    Status post myocardial infarction      Family History  Problem Relation Age of Onset   Stroke Mother     Diabetes Mother    Depression Mother    Diabetes Father    Emphysema Father    Cancer Father        Lung   Asthma Father    Diabetes Sister    Diabetes Brother      Past Surgical History:  Procedure Laterality Date   ABDOMINAL HYSTERECTOMY     APPENDECTOMY     BACK SURGERY  03/2013   fusion   BLADDER REPAIR     salpingoorrphorectomy Right    TONSILLECTOMY     WISDOM TOOTH EXTRACTION      Social History   Socioeconomic History   Marital status: Divorced    Spouse name: Not on file   Number of children: Not on file   Years of education: Not on file   Highest education level: Not on file  Occupational History   Not on file  Tobacco Use   Smoking status: Every Day    Packs/day: 1.00    Years: 62.00    Total pack years: 62.00    Types: Cigarettes    Passive exposure: Past   Smokeless tobacco: Never   Tobacco comments:    1/2 ppd [place], [facility] 06/11/22   Vaping Use   Vaping Use: Never used  Substance and Sexual Activity   Alcohol use:  No   Drug use: No   Sexual activity: Not on file  Other Topics Concern   Not on file  Social History Narrative   Pt lives at home alone. She is divorced, has two children, works part-time, and has a [employer] education level. She drinks 2-3 cups of caffeine daily.    Social Determinants of Health   Financial Resource Strain: Not on file  Food Insecurity: Not on file  Transportation Needs: Not on file  Physical Activity: Not on file  Stress: Not on file  Social Connections: Not on file  Intimate Partner Violence: Not on file     Allergies  Allergen Reactions   Morphine And Related Other (See Comments)    hypotension   Fluocinolone Other (See Comments)   Latex Rash   Neurontin [Gabapentin] Other (See Comments)    dizziness   Penicillins Diarrhea and Other (See Comments)    Cramping in abdomen   Sulfa Drugs Cross Reactors Nausea Only    Really sick feeling   Xanax [Alprazolam] Other (See Comments)    insomnia   Codeine  Swelling    Pt states her body jerks after taking an swelling   Ivp Dye [Iodinated Contrast Media]     Red/hot   Lyrica [Pregabalin] Other (See Comments)    unknown     Outpatient Medications Prior to Visit  Medication Sig Dispense Refill   amLODipine (NORVASC) 5 MG tablet Take 5 mg by mouth daily.     aspirin EC 81 MG tablet Take 1 tablet (81 mg total) by mouth daily. Swallow whole. 90 tablet 3   carvedilol (COREG) 3.125 MG tablet Take 3.125 mg by mouth 2 (two) times daily.     cephALEXin (KEFLEX) 500 MG capsule Take 500 mg by mouth 3 (three) times daily.     cholecalciferol (VITAMIN D3) 25 MCG (1000 UNIT) tablet Take 1,000 Units by mouth daily.     clonazePAM (KLONOPIN) 0.5 MG tablet 0.5 mg at bedtime.      clopidogrel (PLAVIX) 75 MG tablet Take 75 mg by mouth daily.     diclofenac sodium (VOLTAREN) 1 % GEL APPLY TO AFFECTED AREA FOUR TIMES A DAY AS NEEDED (TRANSDERMAL)  0   DULoxetine (CYMBALTA) 30 MG capsule Take 1 capsule by mouth daily.     irbesartan (AVAPRO) 300 MG tablet Take 1 tablet by mouth daily.     lidocaine (XYLOCAINE) 5 % ointment Apply 1 application topically as needed.     loratadine (CLARITIN) 10 MG tablet Take 1 tablet by mouth daily.     Lysine 500 MG TABS Take by mouth daily.     MAGNESIUM PO Take 500 mg by mouth daily. (Patient not taking: Reported on 06/23/2022)     methenamine (HIPREX) 1 g tablet Take 1 g by mouth daily.     neomycin-polymyxin-hydrocortisone (CORTISPORIN) OTIC solution Apply 1-2 drops to toe after soaking BID (Patient not taking: Reported on 06/23/2022) 10 mL 1   nicotine polacrilex (NICORETTE) 2 MG lozenge 1 lozenge as needed     nitroGLYCERIN (NITROSTAT) 0.4 MG SL tablet Place under the tongue. Place under the tongue.     rosuvastatin (CRESTOR) 10 MG tablet      umeclidinium bromide (INCRUSE ELLIPTA) 62.5 MCG/ACT AEPB Inhale 1 puff into the lungs daily. 1 each 11   varenicline (CHANTIX CONTINUING MONTH PAK) 1 MG tablet Take 1 tablet (1 mg  total) by mouth 2 (two) times daily. (Patient not taking: Reported on 06/23/2022) 60 tablet 1  varenicline (CHANTIX PAK) 0.5 MG X 11 & 1 MG X 42 tablet Take one 0.5 mg tablet by mouth once daily for 3 days, then increase to one 0.5 mg tablet twice daily for 4 days, then increase to one 1 mg tablet twice daily. (Patient not taking: Reported on 06/23/2022) 53 tablet 0   vitamin C (ASCORBIC ACID) 500 MG tablet Take 500 mg by mouth daily.     XTAMPZA ER 13.5 MG C12A Take 1 capsule by mouth 2 (two) times a day.     No facility-administered medications prior to visit.       Objective:   Physical Exam:  General appearance: 76 y.o., female, NAD, conversant  Eyes: anicteric sclerae; PERRL, tracking appropriately HENT: NCAT; MMM Neck: Trachea midline; no lymphadenopathy, no JVD Lungs: diminshed right lower lung field, with normal respiratory effort, kyphotic CV: RRR, no murmur  Abdomen: Soft, non-tender; non-distended, BS present  Extremities: No peripheral edema, warm Skin: Normal turgor and texture; no rash Psych: Appropriate affect Neuro: Alert and oriented to person and place, no focal deficit     There were no vitals filed for this visit.     on' \\RA'$  BMI Readings from Last 3 Encounters:  06/23/22 27.04 kg/m  06/11/22 27.17 kg/m  05/21/22 26.42 kg/m   Wt Readings from Last 3 Encounters:  06/23/22 129 lb 6.4 oz (58.7 kg)  06/11/22 130 lb (59 kg)  05/21/22 126 lb 6.4 oz (57.3 kg)     CBC    Component Value Date/Time   WBC 13.5 (H) 12/17/2020 1435   RBC 5.95 (H) 12/17/2020 1435   HGB 17.1 (H) 12/17/2020 1435   HCT 52.2 (H) 12/17/2020 1435   PLT 350 12/17/2020 1435   MCV 87.7 12/17/2020 1435   MCH 28.7 12/17/2020 1435   MCHC 32.8 12/17/2020 1435   RDW 12.8 12/17/2020 1435   LYMPHSABS 1.6 12/17/2020 1435   MONOABS 0.8 12/17/2020 1435   EOSABS 0.1 12/17/2020 1435   BASOSABS 0.1 12/17/2020 1435      Chest Imaging: CXR 12/17/20 reviewed by me remarkable for  diaphragm flattening and hyperinflation  CT 04/17/22 OSH report reviewed by me: Endobronchial material in the distal bronchus intermedius as well as proximal right middle lobe and right lower lobe bronchial branches likely due to aspiration. Partial right middle lobe atelectasis with bronchiectasis may be related to chronic aspiration.  CT Chest8/2/23 reviewed by me with patent RBI, RLL, limited endobronchial debris, total RML atelectasis, a few foci of scar in lung bases  Pulmonary Functions Testing Results:     No data to display          (ABNORMAL) Diffusion Studies (04/18/2022 5:16 PM EDT) Results - (ABNORMAL) Diffusion Studies (04/18/2022 5:16 PM EDT) Component Value Ref Range Test Method Analysis Time Performed At Pathologist Signature  DLCO PRE 5.49 (L) 11.46 - 21.07 ml/(min*mmHg)   04/18/2022 7:19 PM EDT [facility]    DLCO/VA POST 2.91 (L) 3.27 - 5.53 ml/(min*mmHg*L)   04/18/2022 7:19 PM EDT [facility]    VA PRE 1.89 (L) 2.83 - 4.49 L   04/18/2022 7:19 PM EDT [facility]    IVC PRE 1.20 (L) 1.50 - 2.76 L   04/18/2022 7:19 PM EDT [facility]    BHT POST 10.20 sec   04/18/2022 7:19 PM EDT [facility]    FVC PRE 1.44 (L) 1.50 - 2.76 L   04/18/2022 7:19 PM EDT [facility]    FEV1 PRE 0.77 (L) 1.16 - 2.09 L  04/18/2022 7:19 PM EDT [facility]    FEV1/FVC PRE 53.26 (L) 63.82 - 90.59 %   04/18/2022 7:19 PM EDT [facility]    FEF25-75% PRE 0.27 (L) 0.62 - 2.64 L/s   04/18/2022 7:19 PM EDT [facility]    VOH/YWV37 pre 16.39 %   04/18/2022 7:19 PM EDT [facility]    PEF PRE 1.77 (L) 2.87 - 5.68 L/s   04/18/2022 7:19 PM EDT [facility]      Echocardiogram:   TTE 04/18/22:   1. The left ventricle is normal in size with normal wall thickness.    2. The left ventricular systolic function is normal, LVEF is visually  estimated at 55-60%.    3. There is hypokinesis of the inferior wall(s).    4. The right ventricle is normal in size, with normal systolic function.    5. There are no significant valvular abnormalities.   Heart  Catheterization 04/22/22:  Findings:  Successful percutaneous coronary intervention to the ostial LAD with 1  Xience Skypoint drug-eluting stent with 0% residual stenosis and TIMI-3  flow.  Successful percutaneous intervention to the OM1 with a Xience Skypoint  drug-eluting stent with 0% residual stenosis and TIMI-3 flow  Normal left ventricular end diastolic pressure of 1-06YIRS.  No gradient across aortic valve.     Assessment & Plan:   # COPD gold B  # RBI occlusion - due to secretions, largely resolved on interval imaging # RML syndrome Sometimes has trouble swallowing and attributes it to neck issues from spondylolisthesis  # Smoking: Smoking cessation encouraged. She's working on it on her own. Would qualify for lung cancer screening if this repeat CT doesn't show anything concerning.  Plan: - trial stiolto 2 puffs once daily, if prefer symptomatic response to that on trelegy then send [place] a message to have [place] try to fill prescription for it - albuterol prn - if chest congestion, productive cough then start mucinex 600 mg to 1200 mg twice daily for a few days, try flutter valve 10 slow but firm puffs through it to see if it helps you cough up phlegm - chantix - lung cancer screening referral placed last visit, due 06/2023   RTC 6 weeks to follow up on smoking cessation  Maryjane Hurter, MD [facility] 07/29/2022 2:52 PM

## 2022-07-30 ENCOUNTER — Ambulatory Visit: Payer: Medicare Other | Admitting: Student

## 2022-07-30 ENCOUNTER — Ambulatory Visit: Payer: Medicare Other

## 2022-08-04 ENCOUNTER — Emergency Department (HOSPITAL_COMMUNITY)
Admission: EM | Admit: 2022-08-04 | Discharge: 2022-08-04 | Discharge status: Against Medical Advice | Payer: Medicare Other | Attending: Emergency Medicine | Admitting: Emergency Medicine

## 2022-08-04 ENCOUNTER — Other Ambulatory Visit: Payer: Self-pay

## 2022-08-04 ENCOUNTER — Encounter (HOSPITAL_COMMUNITY): Payer: Self-pay

## 2022-08-04 DIAGNOSIS — Z5321 Procedure and treatment not carried out due to patient leaving prior to being seen by health care provider: Secondary | ICD-10-CM | POA: Insufficient documentation

## 2022-08-04 DIAGNOSIS — G8929 Other chronic pain: Secondary | ICD-10-CM | POA: Diagnosis not present

## 2022-08-04 DIAGNOSIS — M549 Dorsalgia, unspecified: Secondary | ICD-10-CM | POA: Diagnosis not present

## 2022-08-04 DIAGNOSIS — Z743 Need for continuous supervision: Secondary | ICD-10-CM | POA: Diagnosis not present

## 2022-08-04 NOTE — ED Notes (Signed)
Pt had family come and pick her up because she no longer wanted to wait

## 2022-08-04 NOTE — ED Triage Notes (Signed)
Patient arrived by EMS from home with complaint of ongoing chronic back pain. Pain increased with ROM. Patient alert and oriented, describes as sharp. Pain for 9 years

## 2022-08-13 DIAGNOSIS — G894 Chronic pain syndrome: Secondary | ICD-10-CM | POA: Diagnosis not present

## 2022-08-13 DIAGNOSIS — E7800 Pure hypercholesterolemia, unspecified: Secondary | ICD-10-CM | POA: Diagnosis not present

## 2022-08-13 DIAGNOSIS — I2510 Atherosclerotic heart disease of native coronary artery without angina pectoris: Secondary | ICD-10-CM | POA: Diagnosis not present

## 2022-08-13 DIAGNOSIS — E1122 Type 2 diabetes mellitus with diabetic chronic kidney disease: Secondary | ICD-10-CM | POA: Diagnosis not present

## 2022-08-13 DIAGNOSIS — M48062 Spinal stenosis, lumbar region with neurogenic claudication: Secondary | ICD-10-CM | POA: Diagnosis not present

## 2022-08-13 DIAGNOSIS — I10 Essential (primary) hypertension: Secondary | ICD-10-CM | POA: Diagnosis not present

## 2022-08-14 ENCOUNTER — Other Ambulatory Visit: Payer: Self-pay | Admitting: Family Medicine

## 2022-08-14 DIAGNOSIS — Z9181 History of falling: Secondary | ICD-10-CM | POA: Diagnosis not present

## 2022-08-14 DIAGNOSIS — F5101 Primary insomnia: Secondary | ICD-10-CM | POA: Diagnosis not present

## 2022-08-14 DIAGNOSIS — K449 Diaphragmatic hernia without obstruction or gangrene: Secondary | ICD-10-CM | POA: Diagnosis not present

## 2022-08-14 DIAGNOSIS — R262 Difficulty in walking, not elsewhere classified: Secondary | ICD-10-CM | POA: Diagnosis not present

## 2022-08-14 DIAGNOSIS — M48062 Spinal stenosis, lumbar region with neurogenic claudication: Secondary | ICD-10-CM

## 2022-08-14 DIAGNOSIS — K5900 Constipation, unspecified: Secondary | ICD-10-CM | POA: Diagnosis not present

## 2022-08-14 DIAGNOSIS — M546 Pain in thoracic spine: Secondary | ICD-10-CM | POA: Diagnosis not present

## 2022-08-14 DIAGNOSIS — E785 Hyperlipidemia, unspecified: Secondary | ICD-10-CM | POA: Diagnosis not present

## 2022-08-14 DIAGNOSIS — F17210 Nicotine dependence, cigarettes, uncomplicated: Secondary | ICD-10-CM | POA: Diagnosis not present

## 2022-08-14 DIAGNOSIS — M549 Dorsalgia, unspecified: Secondary | ICD-10-CM | POA: Diagnosis not present

## 2022-08-14 DIAGNOSIS — J432 Centrilobular emphysema: Secondary | ICD-10-CM | POA: Diagnosis not present

## 2022-08-14 DIAGNOSIS — Z880 Allergy status to penicillin: Secondary | ICD-10-CM | POA: Diagnosis not present

## 2022-08-14 DIAGNOSIS — S32011A Stable burst fracture of first lumbar vertebra, initial encounter for closed fracture: Secondary | ICD-10-CM | POA: Diagnosis not present

## 2022-08-14 DIAGNOSIS — G8929 Other chronic pain: Secondary | ICD-10-CM | POA: Diagnosis not present

## 2022-08-14 DIAGNOSIS — Z743 Need for continuous supervision: Secondary | ICD-10-CM | POA: Diagnosis not present

## 2022-08-14 DIAGNOSIS — E1122 Type 2 diabetes mellitus with diabetic chronic kidney disease: Secondary | ICD-10-CM | POA: Diagnosis not present

## 2022-08-14 DIAGNOSIS — Z7982 Long term (current) use of aspirin: Secondary | ICD-10-CM | POA: Diagnosis not present

## 2022-08-14 DIAGNOSIS — M545 Low back pain, unspecified: Secondary | ICD-10-CM | POA: Diagnosis not present

## 2022-08-14 DIAGNOSIS — Z7902 Long term (current) use of antithrombotics/antiplatelets: Secondary | ICD-10-CM | POA: Diagnosis not present

## 2022-08-14 DIAGNOSIS — D3502 Benign neoplasm of left adrenal gland: Secondary | ICD-10-CM | POA: Diagnosis not present

## 2022-08-14 DIAGNOSIS — Z981 Arthrodesis status: Secondary | ICD-10-CM | POA: Diagnosis not present

## 2022-08-14 DIAGNOSIS — Z882 Allergy status to sulfonamides status: Secondary | ICD-10-CM | POA: Diagnosis not present

## 2022-08-14 DIAGNOSIS — M47814 Spondylosis without myelopathy or radiculopathy, thoracic region: Secondary | ICD-10-CM | POA: Diagnosis not present

## 2022-08-14 DIAGNOSIS — I129 Hypertensive chronic kidney disease with stage 1 through stage 4 chronic kidney disease, or unspecified chronic kidney disease: Secondary | ICD-10-CM | POA: Diagnosis not present

## 2022-08-14 DIAGNOSIS — I7123 Aneurysm of the descending thoracic aorta, without rupture: Secondary | ICD-10-CM | POA: Diagnosis not present

## 2022-08-14 DIAGNOSIS — Z885 Allergy status to narcotic agent status: Secondary | ICD-10-CM | POA: Diagnosis not present

## 2022-08-14 DIAGNOSIS — N183 Chronic kidney disease, stage 3 unspecified: Secondary | ICD-10-CM | POA: Diagnosis not present

## 2022-08-14 DIAGNOSIS — R531 Weakness: Secondary | ICD-10-CM | POA: Diagnosis not present

## 2022-08-14 DIAGNOSIS — Z7409 Other reduced mobility: Secondary | ICD-10-CM | POA: Diagnosis not present

## 2022-08-14 DIAGNOSIS — E1140 Type 2 diabetes mellitus with diabetic neuropathy, unspecified: Secondary | ICD-10-CM | POA: Diagnosis not present

## 2022-08-14 DIAGNOSIS — W19XXXA Unspecified fall, initial encounter: Secondary | ICD-10-CM | POA: Diagnosis not present

## 2022-08-15 DIAGNOSIS — M549 Dorsalgia, unspecified: Secondary | ICD-10-CM | POA: Diagnosis not present

## 2022-08-15 DIAGNOSIS — W19XXXA Unspecified fall, initial encounter: Secondary | ICD-10-CM | POA: Diagnosis not present

## 2022-08-15 DIAGNOSIS — G8929 Other chronic pain: Secondary | ICD-10-CM | POA: Diagnosis not present

## 2022-08-15 DIAGNOSIS — S32009A Unspecified fracture of unspecified lumbar vertebra, initial encounter for closed fracture: Secondary | ICD-10-CM | POA: Diagnosis not present

## 2022-08-16 DIAGNOSIS — M549 Dorsalgia, unspecified: Secondary | ICD-10-CM | POA: Diagnosis not present

## 2022-08-16 DIAGNOSIS — G8929 Other chronic pain: Secondary | ICD-10-CM | POA: Diagnosis not present

## 2022-08-16 DIAGNOSIS — S32009A Unspecified fracture of unspecified lumbar vertebra, initial encounter for closed fracture: Secondary | ICD-10-CM | POA: Diagnosis not present

## 2022-08-16 DIAGNOSIS — W19XXXA Unspecified fall, initial encounter: Secondary | ICD-10-CM | POA: Diagnosis not present

## 2022-08-17 DIAGNOSIS — S32009A Unspecified fracture of unspecified lumbar vertebra, initial encounter for closed fracture: Secondary | ICD-10-CM | POA: Diagnosis not present

## 2022-08-17 DIAGNOSIS — G8929 Other chronic pain: Secondary | ICD-10-CM | POA: Diagnosis not present

## 2022-08-17 DIAGNOSIS — M549 Dorsalgia, unspecified: Secondary | ICD-10-CM | POA: Diagnosis not present

## 2022-08-17 DIAGNOSIS — K5904 Chronic idiopathic constipation: Secondary | ICD-10-CM | POA: Diagnosis not present

## 2022-08-18 DIAGNOSIS — K5904 Chronic idiopathic constipation: Secondary | ICD-10-CM | POA: Diagnosis not present

## 2022-08-18 DIAGNOSIS — G8929 Other chronic pain: Secondary | ICD-10-CM | POA: Diagnosis not present

## 2022-08-18 DIAGNOSIS — S32009A Unspecified fracture of unspecified lumbar vertebra, initial encounter for closed fracture: Secondary | ICD-10-CM | POA: Diagnosis not present

## 2022-08-18 DIAGNOSIS — M549 Dorsalgia, unspecified: Secondary | ICD-10-CM | POA: Diagnosis not present

## 2022-08-19 DIAGNOSIS — E785 Hyperlipidemia, unspecified: Secondary | ICD-10-CM | POA: Diagnosis not present

## 2022-08-19 DIAGNOSIS — I252 Old myocardial infarction: Secondary | ICD-10-CM | POA: Diagnosis not present

## 2022-08-19 DIAGNOSIS — D3502 Benign neoplasm of left adrenal gland: Secondary | ICD-10-CM | POA: Diagnosis not present

## 2022-08-19 DIAGNOSIS — Z9181 History of falling: Secondary | ICD-10-CM | POA: Diagnosis not present

## 2022-08-19 DIAGNOSIS — J432 Centrilobular emphysema: Secondary | ICD-10-CM | POA: Diagnosis not present

## 2022-08-19 DIAGNOSIS — K5909 Other constipation: Secondary | ICD-10-CM | POA: Diagnosis not present

## 2022-08-19 DIAGNOSIS — Z7982 Long term (current) use of aspirin: Secondary | ICD-10-CM | POA: Diagnosis not present

## 2022-08-19 DIAGNOSIS — R262 Difficulty in walking, not elsewhere classified: Secondary | ICD-10-CM | POA: Diagnosis not present

## 2022-08-19 DIAGNOSIS — W19XXXD Unspecified fall, subsequent encounter: Secondary | ICD-10-CM | POA: Diagnosis not present

## 2022-08-19 DIAGNOSIS — Z885 Allergy status to narcotic agent status: Secondary | ICD-10-CM | POA: Diagnosis not present

## 2022-08-19 DIAGNOSIS — S32009A Unspecified fracture of unspecified lumbar vertebra, initial encounter for closed fracture: Secondary | ICD-10-CM | POA: Diagnosis not present

## 2022-08-19 DIAGNOSIS — R531 Weakness: Secondary | ICD-10-CM | POA: Diagnosis not present

## 2022-08-19 DIAGNOSIS — E1122 Type 2 diabetes mellitus with diabetic chronic kidney disease: Secondary | ICD-10-CM | POA: Diagnosis not present

## 2022-08-19 DIAGNOSIS — I129 Hypertensive chronic kidney disease with stage 1 through stage 4 chronic kidney disease, or unspecified chronic kidney disease: Secondary | ICD-10-CM | POA: Diagnosis not present

## 2022-08-19 DIAGNOSIS — M545 Low back pain, unspecified: Secondary | ICD-10-CM | POA: Diagnosis not present

## 2022-08-19 DIAGNOSIS — M810 Age-related osteoporosis without current pathological fracture: Secondary | ICD-10-CM | POA: Diagnosis not present

## 2022-08-19 DIAGNOSIS — J449 Chronic obstructive pulmonary disease, unspecified: Secondary | ICD-10-CM | POA: Diagnosis not present

## 2022-08-19 DIAGNOSIS — S32011A Stable burst fracture of first lumbar vertebra, initial encounter for closed fracture: Secondary | ICD-10-CM | POA: Diagnosis not present

## 2022-08-19 DIAGNOSIS — E1140 Type 2 diabetes mellitus with diabetic neuropathy, unspecified: Secondary | ICD-10-CM | POA: Diagnosis not present

## 2022-08-19 DIAGNOSIS — M549 Dorsalgia, unspecified: Secondary | ICD-10-CM | POA: Diagnosis not present

## 2022-08-19 DIAGNOSIS — N183 Chronic kidney disease, stage 3 unspecified: Secondary | ICD-10-CM | POA: Diagnosis not present

## 2022-08-19 DIAGNOSIS — F17210 Nicotine dependence, cigarettes, uncomplicated: Secondary | ICD-10-CM | POA: Diagnosis not present

## 2022-08-19 DIAGNOSIS — Z882 Allergy status to sulfonamides status: Secondary | ICD-10-CM | POA: Diagnosis not present

## 2022-08-19 DIAGNOSIS — S32009D Unspecified fracture of unspecified lumbar vertebra, subsequent encounter for fracture with routine healing: Secondary | ICD-10-CM | POA: Diagnosis not present

## 2022-08-19 DIAGNOSIS — Z880 Allergy status to penicillin: Secondary | ICD-10-CM | POA: Diagnosis not present

## 2022-08-19 DIAGNOSIS — Z7409 Other reduced mobility: Secondary | ICD-10-CM | POA: Diagnosis not present

## 2022-08-19 DIAGNOSIS — W19XXXA Unspecified fall, initial encounter: Secondary | ICD-10-CM | POA: Diagnosis not present

## 2022-08-19 DIAGNOSIS — F32A Depression, unspecified: Secondary | ICD-10-CM | POA: Diagnosis not present

## 2022-08-19 DIAGNOSIS — Z7902 Long term (current) use of antithrombotics/antiplatelets: Secondary | ICD-10-CM | POA: Diagnosis not present

## 2022-08-19 DIAGNOSIS — G8929 Other chronic pain: Secondary | ICD-10-CM | POA: Diagnosis not present

## 2022-08-19 DIAGNOSIS — M47814 Spondylosis without myelopathy or radiculopathy, thoracic region: Secondary | ICD-10-CM | POA: Diagnosis not present

## 2022-08-19 DIAGNOSIS — F5101 Primary insomnia: Secondary | ICD-10-CM | POA: Diagnosis not present

## 2022-08-19 DIAGNOSIS — M546 Pain in thoracic spine: Secondary | ICD-10-CM | POA: Diagnosis not present

## 2022-08-19 DIAGNOSIS — Z91041 Radiographic dye allergy status: Secondary | ICD-10-CM | POA: Diagnosis not present

## 2022-08-19 DIAGNOSIS — Z981 Arthrodesis status: Secondary | ICD-10-CM | POA: Diagnosis not present

## 2022-08-26 DIAGNOSIS — W19XXXD Unspecified fall, subsequent encounter: Secondary | ICD-10-CM | POA: Diagnosis not present

## 2022-08-26 DIAGNOSIS — S32019D Unspecified fracture of first lumbar vertebra, subsequent encounter for fracture with routine healing: Secondary | ICD-10-CM | POA: Diagnosis not present

## 2022-08-26 DIAGNOSIS — E785 Hyperlipidemia, unspecified: Secondary | ICD-10-CM | POA: Diagnosis not present

## 2022-08-26 DIAGNOSIS — I152 Hypertension secondary to endocrine disorders: Secondary | ICD-10-CM | POA: Diagnosis not present

## 2022-08-26 DIAGNOSIS — I129 Hypertensive chronic kidney disease with stage 1 through stage 4 chronic kidney disease, or unspecified chronic kidney disease: Secondary | ICD-10-CM | POA: Diagnosis not present

## 2022-08-26 DIAGNOSIS — I214 Non-ST elevation (NSTEMI) myocardial infarction: Secondary | ICD-10-CM | POA: Diagnosis not present

## 2022-08-26 DIAGNOSIS — J449 Chronic obstructive pulmonary disease, unspecified: Secondary | ICD-10-CM | POA: Diagnosis not present

## 2022-08-26 DIAGNOSIS — F5101 Primary insomnia: Secondary | ICD-10-CM | POA: Diagnosis not present

## 2022-08-26 DIAGNOSIS — E1159 Type 2 diabetes mellitus with other circulatory complications: Secondary | ICD-10-CM | POA: Diagnosis not present

## 2022-08-26 DIAGNOSIS — S22089D Unspecified fracture of T11-T12 vertebra, subsequent encounter for fracture with routine healing: Secondary | ICD-10-CM | POA: Diagnosis not present

## 2022-08-26 DIAGNOSIS — N183 Chronic kidney disease, stage 3 unspecified: Secondary | ICD-10-CM | POA: Diagnosis not present

## 2022-08-26 DIAGNOSIS — E1169 Type 2 diabetes mellitus with other specified complication: Secondary | ICD-10-CM | POA: Diagnosis not present

## 2022-08-26 DIAGNOSIS — Z720 Tobacco use: Secondary | ICD-10-CM | POA: Diagnosis not present

## 2022-08-26 DIAGNOSIS — E1122 Type 2 diabetes mellitus with diabetic chronic kidney disease: Secondary | ICD-10-CM | POA: Diagnosis not present

## 2022-08-28 DIAGNOSIS — E785 Hyperlipidemia, unspecified: Secondary | ICD-10-CM | POA: Diagnosis not present

## 2022-08-28 DIAGNOSIS — I129 Hypertensive chronic kidney disease with stage 1 through stage 4 chronic kidney disease, or unspecified chronic kidney disease: Secondary | ICD-10-CM | POA: Diagnosis not present

## 2022-08-28 DIAGNOSIS — E1159 Type 2 diabetes mellitus with other circulatory complications: Secondary | ICD-10-CM | POA: Diagnosis not present

## 2022-08-28 DIAGNOSIS — S22089D Unspecified fracture of T11-T12 vertebra, subsequent encounter for fracture with routine healing: Secondary | ICD-10-CM | POA: Diagnosis not present

## 2022-08-28 DIAGNOSIS — Z720 Tobacco use: Secondary | ICD-10-CM | POA: Diagnosis not present

## 2022-08-28 DIAGNOSIS — W19XXXD Unspecified fall, subsequent encounter: Secondary | ICD-10-CM | POA: Diagnosis not present

## 2022-08-28 DIAGNOSIS — S32019D Unspecified fracture of first lumbar vertebra, subsequent encounter for fracture with routine healing: Secondary | ICD-10-CM | POA: Diagnosis not present

## 2022-08-28 DIAGNOSIS — F5101 Primary insomnia: Secondary | ICD-10-CM | POA: Diagnosis not present

## 2022-08-28 DIAGNOSIS — N183 Chronic kidney disease, stage 3 unspecified: Secondary | ICD-10-CM | POA: Diagnosis not present

## 2022-08-28 DIAGNOSIS — I214 Non-ST elevation (NSTEMI) myocardial infarction: Secondary | ICD-10-CM | POA: Diagnosis not present

## 2022-08-28 DIAGNOSIS — E1169 Type 2 diabetes mellitus with other specified complication: Secondary | ICD-10-CM | POA: Diagnosis not present

## 2022-08-28 DIAGNOSIS — J449 Chronic obstructive pulmonary disease, unspecified: Secondary | ICD-10-CM | POA: Diagnosis not present

## 2022-08-28 DIAGNOSIS — I152 Hypertension secondary to endocrine disorders: Secondary | ICD-10-CM | POA: Diagnosis not present

## 2022-08-28 DIAGNOSIS — E1122 Type 2 diabetes mellitus with diabetic chronic kidney disease: Secondary | ICD-10-CM | POA: Diagnosis not present

## 2022-08-29 DIAGNOSIS — F5101 Primary insomnia: Secondary | ICD-10-CM | POA: Diagnosis not present

## 2022-08-29 DIAGNOSIS — I214 Non-ST elevation (NSTEMI) myocardial infarction: Secondary | ICD-10-CM | POA: Diagnosis not present

## 2022-08-29 DIAGNOSIS — E785 Hyperlipidemia, unspecified: Secondary | ICD-10-CM | POA: Diagnosis not present

## 2022-08-29 DIAGNOSIS — Z720 Tobacco use: Secondary | ICD-10-CM | POA: Diagnosis not present

## 2022-08-29 DIAGNOSIS — E1122 Type 2 diabetes mellitus with diabetic chronic kidney disease: Secondary | ICD-10-CM | POA: Diagnosis not present

## 2022-08-29 DIAGNOSIS — W19XXXD Unspecified fall, subsequent encounter: Secondary | ICD-10-CM | POA: Diagnosis not present

## 2022-08-29 DIAGNOSIS — J449 Chronic obstructive pulmonary disease, unspecified: Secondary | ICD-10-CM | POA: Diagnosis not present

## 2022-08-29 DIAGNOSIS — S32019D Unspecified fracture of first lumbar vertebra, subsequent encounter for fracture with routine healing: Secondary | ICD-10-CM | POA: Diagnosis not present

## 2022-08-29 DIAGNOSIS — E1169 Type 2 diabetes mellitus with other specified complication: Secondary | ICD-10-CM | POA: Diagnosis not present

## 2022-08-29 DIAGNOSIS — E1159 Type 2 diabetes mellitus with other circulatory complications: Secondary | ICD-10-CM | POA: Diagnosis not present

## 2022-08-29 DIAGNOSIS — I129 Hypertensive chronic kidney disease with stage 1 through stage 4 chronic kidney disease, or unspecified chronic kidney disease: Secondary | ICD-10-CM | POA: Diagnosis not present

## 2022-08-29 DIAGNOSIS — N183 Chronic kidney disease, stage 3 unspecified: Secondary | ICD-10-CM | POA: Diagnosis not present

## 2022-08-29 DIAGNOSIS — I152 Hypertension secondary to endocrine disorders: Secondary | ICD-10-CM | POA: Diagnosis not present

## 2022-08-29 DIAGNOSIS — S22089D Unspecified fracture of T11-T12 vertebra, subsequent encounter for fracture with routine healing: Secondary | ICD-10-CM | POA: Diagnosis not present

## 2022-09-01 DIAGNOSIS — N183 Chronic kidney disease, stage 3 unspecified: Secondary | ICD-10-CM | POA: Diagnosis not present

## 2022-09-01 DIAGNOSIS — I152 Hypertension secondary to endocrine disorders: Secondary | ICD-10-CM | POA: Diagnosis not present

## 2022-09-01 DIAGNOSIS — I10 Essential (primary) hypertension: Secondary | ICD-10-CM | POA: Diagnosis not present

## 2022-09-01 DIAGNOSIS — E1122 Type 2 diabetes mellitus with diabetic chronic kidney disease: Secondary | ICD-10-CM | POA: Diagnosis not present

## 2022-09-01 DIAGNOSIS — E1159 Type 2 diabetes mellitus with other circulatory complications: Secondary | ICD-10-CM | POA: Diagnosis not present

## 2022-09-01 DIAGNOSIS — Z720 Tobacco use: Secondary | ICD-10-CM | POA: Diagnosis not present

## 2022-09-01 DIAGNOSIS — S32019D Unspecified fracture of first lumbar vertebra, subsequent encounter for fracture with routine healing: Secondary | ICD-10-CM | POA: Diagnosis not present

## 2022-09-01 DIAGNOSIS — J449 Chronic obstructive pulmonary disease, unspecified: Secondary | ICD-10-CM | POA: Diagnosis not present

## 2022-09-01 DIAGNOSIS — E785 Hyperlipidemia, unspecified: Secondary | ICD-10-CM | POA: Diagnosis not present

## 2022-09-01 DIAGNOSIS — E7800 Pure hypercholesterolemia, unspecified: Secondary | ICD-10-CM | POA: Diagnosis not present

## 2022-09-01 DIAGNOSIS — F5101 Primary insomnia: Secondary | ICD-10-CM | POA: Diagnosis not present

## 2022-09-01 DIAGNOSIS — M810 Age-related osteoporosis without current pathological fracture: Secondary | ICD-10-CM | POA: Diagnosis not present

## 2022-09-01 DIAGNOSIS — I129 Hypertensive chronic kidney disease with stage 1 through stage 4 chronic kidney disease, or unspecified chronic kidney disease: Secondary | ICD-10-CM | POA: Diagnosis not present

## 2022-09-01 DIAGNOSIS — I214 Non-ST elevation (NSTEMI) myocardial infarction: Secondary | ICD-10-CM | POA: Diagnosis not present

## 2022-09-01 DIAGNOSIS — E1169 Type 2 diabetes mellitus with other specified complication: Secondary | ICD-10-CM | POA: Diagnosis not present

## 2022-09-01 DIAGNOSIS — S22089D Unspecified fracture of T11-T12 vertebra, subsequent encounter for fracture with routine healing: Secondary | ICD-10-CM | POA: Diagnosis not present

## 2022-09-01 DIAGNOSIS — W19XXXD Unspecified fall, subsequent encounter: Secondary | ICD-10-CM | POA: Diagnosis not present

## 2022-09-03 DIAGNOSIS — I10 Essential (primary) hypertension: Secondary | ICD-10-CM | POA: Diagnosis not present

## 2022-09-03 DIAGNOSIS — G894 Chronic pain syndrome: Secondary | ICD-10-CM | POA: Diagnosis not present

## 2022-09-03 DIAGNOSIS — I2510 Atherosclerotic heart disease of native coronary artery without angina pectoris: Secondary | ICD-10-CM | POA: Diagnosis not present

## 2022-09-03 DIAGNOSIS — E7800 Pure hypercholesterolemia, unspecified: Secondary | ICD-10-CM | POA: Diagnosis not present

## 2022-09-03 DIAGNOSIS — E1122 Type 2 diabetes mellitus with diabetic chronic kidney disease: Secondary | ICD-10-CM | POA: Diagnosis not present

## 2022-09-03 DIAGNOSIS — S32011D Stable burst fracture of first lumbar vertebra, subsequent encounter for fracture with routine healing: Secondary | ICD-10-CM | POA: Diagnosis not present

## 2022-09-04 DIAGNOSIS — J449 Chronic obstructive pulmonary disease, unspecified: Secondary | ICD-10-CM | POA: Diagnosis not present

## 2022-09-04 DIAGNOSIS — F5101 Primary insomnia: Secondary | ICD-10-CM | POA: Diagnosis not present

## 2022-09-04 DIAGNOSIS — E785 Hyperlipidemia, unspecified: Secondary | ICD-10-CM | POA: Diagnosis not present

## 2022-09-04 DIAGNOSIS — S22089D Unspecified fracture of T11-T12 vertebra, subsequent encounter for fracture with routine healing: Secondary | ICD-10-CM | POA: Diagnosis not present

## 2022-09-04 DIAGNOSIS — S32019D Unspecified fracture of first lumbar vertebra, subsequent encounter for fracture with routine healing: Secondary | ICD-10-CM | POA: Diagnosis not present

## 2022-09-04 DIAGNOSIS — E1122 Type 2 diabetes mellitus with diabetic chronic kidney disease: Secondary | ICD-10-CM | POA: Diagnosis not present

## 2022-09-04 DIAGNOSIS — I214 Non-ST elevation (NSTEMI) myocardial infarction: Secondary | ICD-10-CM | POA: Diagnosis not present

## 2022-09-04 DIAGNOSIS — I129 Hypertensive chronic kidney disease with stage 1 through stage 4 chronic kidney disease, or unspecified chronic kidney disease: Secondary | ICD-10-CM | POA: Diagnosis not present

## 2022-09-04 DIAGNOSIS — W19XXXD Unspecified fall, subsequent encounter: Secondary | ICD-10-CM | POA: Diagnosis not present

## 2022-09-04 DIAGNOSIS — I152 Hypertension secondary to endocrine disorders: Secondary | ICD-10-CM | POA: Diagnosis not present

## 2022-09-04 DIAGNOSIS — E1169 Type 2 diabetes mellitus with other specified complication: Secondary | ICD-10-CM | POA: Diagnosis not present

## 2022-09-04 DIAGNOSIS — E1159 Type 2 diabetes mellitus with other circulatory complications: Secondary | ICD-10-CM | POA: Diagnosis not present

## 2022-09-04 DIAGNOSIS — Z720 Tobacco use: Secondary | ICD-10-CM | POA: Diagnosis not present

## 2022-09-04 DIAGNOSIS — N183 Chronic kidney disease, stage 3 unspecified: Secondary | ICD-10-CM | POA: Diagnosis not present

## 2022-09-09 DIAGNOSIS — S22089D Unspecified fracture of T11-T12 vertebra, subsequent encounter for fracture with routine healing: Secondary | ICD-10-CM | POA: Diagnosis not present

## 2022-09-09 DIAGNOSIS — W19XXXD Unspecified fall, subsequent encounter: Secondary | ICD-10-CM | POA: Diagnosis not present

## 2022-09-09 DIAGNOSIS — E1159 Type 2 diabetes mellitus with other circulatory complications: Secondary | ICD-10-CM | POA: Diagnosis not present

## 2022-09-09 DIAGNOSIS — J449 Chronic obstructive pulmonary disease, unspecified: Secondary | ICD-10-CM | POA: Diagnosis not present

## 2022-09-09 DIAGNOSIS — S32019D Unspecified fracture of first lumbar vertebra, subsequent encounter for fracture with routine healing: Secondary | ICD-10-CM | POA: Diagnosis not present

## 2022-09-09 DIAGNOSIS — F5101 Primary insomnia: Secondary | ICD-10-CM | POA: Diagnosis not present

## 2022-09-09 DIAGNOSIS — N183 Chronic kidney disease, stage 3 unspecified: Secondary | ICD-10-CM | POA: Diagnosis not present

## 2022-09-09 DIAGNOSIS — I214 Non-ST elevation (NSTEMI) myocardial infarction: Secondary | ICD-10-CM | POA: Diagnosis not present

## 2022-09-09 DIAGNOSIS — Z720 Tobacco use: Secondary | ICD-10-CM | POA: Diagnosis not present

## 2022-09-09 DIAGNOSIS — I152 Hypertension secondary to endocrine disorders: Secondary | ICD-10-CM | POA: Diagnosis not present

## 2022-09-09 DIAGNOSIS — E1169 Type 2 diabetes mellitus with other specified complication: Secondary | ICD-10-CM | POA: Diagnosis not present

## 2022-09-09 DIAGNOSIS — E785 Hyperlipidemia, unspecified: Secondary | ICD-10-CM | POA: Diagnosis not present

## 2022-09-09 DIAGNOSIS — I129 Hypertensive chronic kidney disease with stage 1 through stage 4 chronic kidney disease, or unspecified chronic kidney disease: Secondary | ICD-10-CM | POA: Diagnosis not present

## 2022-09-09 DIAGNOSIS — E1122 Type 2 diabetes mellitus with diabetic chronic kidney disease: Secondary | ICD-10-CM | POA: Diagnosis not present

## 2022-09-11 DIAGNOSIS — E785 Hyperlipidemia, unspecified: Secondary | ICD-10-CM | POA: Diagnosis not present

## 2022-09-11 DIAGNOSIS — I152 Hypertension secondary to endocrine disorders: Secondary | ICD-10-CM | POA: Diagnosis not present

## 2022-09-11 DIAGNOSIS — I214 Non-ST elevation (NSTEMI) myocardial infarction: Secondary | ICD-10-CM | POA: Diagnosis not present

## 2022-09-11 DIAGNOSIS — N183 Chronic kidney disease, stage 3 unspecified: Secondary | ICD-10-CM | POA: Diagnosis not present

## 2022-09-11 DIAGNOSIS — I129 Hypertensive chronic kidney disease with stage 1 through stage 4 chronic kidney disease, or unspecified chronic kidney disease: Secondary | ICD-10-CM | POA: Diagnosis not present

## 2022-09-11 DIAGNOSIS — F5101 Primary insomnia: Secondary | ICD-10-CM | POA: Diagnosis not present

## 2022-09-11 DIAGNOSIS — E1159 Type 2 diabetes mellitus with other circulatory complications: Secondary | ICD-10-CM | POA: Diagnosis not present

## 2022-09-11 DIAGNOSIS — E1122 Type 2 diabetes mellitus with diabetic chronic kidney disease: Secondary | ICD-10-CM | POA: Diagnosis not present

## 2022-09-11 DIAGNOSIS — J449 Chronic obstructive pulmonary disease, unspecified: Secondary | ICD-10-CM | POA: Diagnosis not present

## 2022-09-11 DIAGNOSIS — W19XXXD Unspecified fall, subsequent encounter: Secondary | ICD-10-CM | POA: Diagnosis not present

## 2022-09-11 DIAGNOSIS — S22089D Unspecified fracture of T11-T12 vertebra, subsequent encounter for fracture with routine healing: Secondary | ICD-10-CM | POA: Diagnosis not present

## 2022-09-11 DIAGNOSIS — S32019D Unspecified fracture of first lumbar vertebra, subsequent encounter for fracture with routine healing: Secondary | ICD-10-CM | POA: Diagnosis not present

## 2022-09-11 DIAGNOSIS — E1169 Type 2 diabetes mellitus with other specified complication: Secondary | ICD-10-CM | POA: Diagnosis not present

## 2022-09-11 DIAGNOSIS — Z720 Tobacco use: Secondary | ICD-10-CM | POA: Diagnosis not present

## 2022-09-15 ENCOUNTER — Other Ambulatory Visit (HOSPITAL_COMMUNITY): Payer: Medicare Other

## 2022-09-15 DIAGNOSIS — Z7982 Long term (current) use of aspirin: Secondary | ICD-10-CM | POA: Diagnosis not present

## 2022-09-15 DIAGNOSIS — Z880 Allergy status to penicillin: Secondary | ICD-10-CM | POA: Diagnosis not present

## 2022-09-15 DIAGNOSIS — W19XXXA Unspecified fall, initial encounter: Secondary | ICD-10-CM | POA: Diagnosis not present

## 2022-09-15 DIAGNOSIS — R296 Repeated falls: Secondary | ICD-10-CM | POA: Diagnosis not present

## 2022-09-15 DIAGNOSIS — K5900 Constipation, unspecified: Secondary | ICD-10-CM | POA: Diagnosis not present

## 2022-09-15 DIAGNOSIS — Z882 Allergy status to sulfonamides status: Secondary | ICD-10-CM | POA: Diagnosis not present

## 2022-09-15 DIAGNOSIS — M1990 Unspecified osteoarthritis, unspecified site: Secondary | ICD-10-CM | POA: Diagnosis not present

## 2022-09-15 DIAGNOSIS — Z79899 Other long term (current) drug therapy: Secondary | ICD-10-CM | POA: Diagnosis not present

## 2022-09-15 DIAGNOSIS — I6201 Nontraumatic acute subdural hemorrhage: Secondary | ICD-10-CM | POA: Diagnosis not present

## 2022-09-15 DIAGNOSIS — R5381 Other malaise: Secondary | ICD-10-CM | POA: Diagnosis not present

## 2022-09-15 DIAGNOSIS — G4700 Insomnia, unspecified: Secondary | ICD-10-CM | POA: Diagnosis not present

## 2022-09-15 DIAGNOSIS — F17210 Nicotine dependence, cigarettes, uncomplicated: Secondary | ICD-10-CM | POA: Diagnosis not present

## 2022-09-15 DIAGNOSIS — M545 Low back pain, unspecified: Secondary | ICD-10-CM | POA: Diagnosis not present

## 2022-09-15 DIAGNOSIS — R54 Age-related physical debility: Secondary | ICD-10-CM | POA: Diagnosis not present

## 2022-09-15 DIAGNOSIS — S065X0D Traumatic subdural hemorrhage without loss of consciousness, subsequent encounter: Secondary | ICD-10-CM | POA: Diagnosis not present

## 2022-09-15 DIAGNOSIS — R52 Pain, unspecified: Secondary | ICD-10-CM | POA: Diagnosis not present

## 2022-09-15 DIAGNOSIS — R531 Weakness: Secondary | ICD-10-CM | POA: Diagnosis not present

## 2022-09-15 DIAGNOSIS — S065X0A Traumatic subdural hemorrhage without loss of consciousness, initial encounter: Secondary | ICD-10-CM | POA: Diagnosis not present

## 2022-09-15 DIAGNOSIS — S32019A Unspecified fracture of first lumbar vertebra, initial encounter for closed fracture: Secondary | ICD-10-CM | POA: Diagnosis not present

## 2022-09-15 DIAGNOSIS — M546 Pain in thoracic spine: Secondary | ICD-10-CM | POA: Diagnosis not present

## 2022-09-15 DIAGNOSIS — R6889 Other general symptoms and signs: Secondary | ICD-10-CM | POA: Diagnosis not present

## 2022-09-15 DIAGNOSIS — Z7902 Long term (current) use of antithrombotics/antiplatelets: Secondary | ICD-10-CM | POA: Diagnosis not present

## 2022-09-15 DIAGNOSIS — S32009D Unspecified fracture of unspecified lumbar vertebra, subsequent encounter for fracture with routine healing: Secondary | ICD-10-CM | POA: Diagnosis not present

## 2022-09-15 DIAGNOSIS — N183 Chronic kidney disease, stage 3 unspecified: Secondary | ICD-10-CM | POA: Diagnosis not present

## 2022-09-15 DIAGNOSIS — I214 Non-ST elevation (NSTEMI) myocardial infarction: Secondary | ICD-10-CM | POA: Diagnosis not present

## 2022-09-15 DIAGNOSIS — I252 Old myocardial infarction: Secondary | ICD-10-CM | POA: Diagnosis not present

## 2022-09-15 DIAGNOSIS — E1140 Type 2 diabetes mellitus with diabetic neuropathy, unspecified: Secondary | ICD-10-CM | POA: Diagnosis not present

## 2022-09-15 DIAGNOSIS — I2510 Atherosclerotic heart disease of native coronary artery without angina pectoris: Secondary | ICD-10-CM | POA: Diagnosis not present

## 2022-09-15 DIAGNOSIS — F32A Depression, unspecified: Secondary | ICD-10-CM | POA: Diagnosis not present

## 2022-09-15 DIAGNOSIS — T699XXA Effect of reduced temperature, unspecified, initial encounter: Secondary | ICD-10-CM | POA: Diagnosis not present

## 2022-09-15 DIAGNOSIS — Z743 Need for continuous supervision: Secondary | ICD-10-CM | POA: Diagnosis not present

## 2022-09-15 DIAGNOSIS — S065XAA Traumatic subdural hemorrhage with loss of consciousness status unknown, initial encounter: Secondary | ICD-10-CM | POA: Diagnosis not present

## 2022-09-15 DIAGNOSIS — Z8679 Personal history of other diseases of the circulatory system: Secondary | ICD-10-CM | POA: Diagnosis not present

## 2022-09-15 DIAGNOSIS — E7800 Pure hypercholesterolemia, unspecified: Secondary | ICD-10-CM | POA: Diagnosis not present

## 2022-09-15 DIAGNOSIS — I129 Hypertensive chronic kidney disease with stage 1 through stage 4 chronic kidney disease, or unspecified chronic kidney disease: Secondary | ICD-10-CM | POA: Diagnosis not present

## 2022-09-15 DIAGNOSIS — I10 Essential (primary) hypertension: Secondary | ICD-10-CM | POA: Diagnosis not present

## 2022-09-15 DIAGNOSIS — G8929 Other chronic pain: Secondary | ICD-10-CM | POA: Diagnosis not present

## 2022-09-15 DIAGNOSIS — S065X9D Traumatic subdural hemorrhage with loss of consciousness of unspecified duration, subsequent encounter: Secondary | ICD-10-CM | POA: Diagnosis not present

## 2022-09-15 DIAGNOSIS — J449 Chronic obstructive pulmonary disease, unspecified: Secondary | ICD-10-CM | POA: Diagnosis not present

## 2022-09-15 DIAGNOSIS — N390 Urinary tract infection, site not specified: Secondary | ICD-10-CM | POA: Diagnosis not present

## 2022-09-15 DIAGNOSIS — E785 Hyperlipidemia, unspecified: Secondary | ICD-10-CM | POA: Diagnosis not present

## 2022-09-15 DIAGNOSIS — N3001 Acute cystitis with hematuria: Secondary | ICD-10-CM | POA: Diagnosis not present

## 2022-09-15 DIAGNOSIS — E1122 Type 2 diabetes mellitus with diabetic chronic kidney disease: Secondary | ICD-10-CM | POA: Diagnosis not present

## 2022-09-15 DIAGNOSIS — M810 Age-related osteoporosis without current pathological fracture: Secondary | ICD-10-CM | POA: Diagnosis not present

## 2022-09-15 DIAGNOSIS — I639 Cerebral infarction, unspecified: Secondary | ICD-10-CM | POA: Diagnosis not present

## 2022-09-15 DIAGNOSIS — Z792 Long term (current) use of antibiotics: Secondary | ICD-10-CM | POA: Diagnosis not present

## 2022-09-15 DIAGNOSIS — B3731 Acute candidiasis of vulva and vagina: Secondary | ICD-10-CM | POA: Diagnosis not present

## 2022-09-15 DIAGNOSIS — J302 Other seasonal allergic rhinitis: Secondary | ICD-10-CM | POA: Diagnosis not present

## 2022-09-21 ENCOUNTER — Encounter: Payer: Self-pay | Admitting: Cardiovascular Disease

## 2022-09-21 NOTE — Progress Notes (Signed)
This encounter was created in error - please disregard.

## 2022-09-22 ENCOUNTER — Encounter: Payer: Medicare Other | Admitting: Cardiovascular Disease

## 2022-09-22 DIAGNOSIS — J449 Chronic obstructive pulmonary disease, unspecified: Secondary | ICD-10-CM | POA: Diagnosis not present

## 2022-09-22 DIAGNOSIS — E7800 Pure hypercholesterolemia, unspecified: Secondary | ICD-10-CM | POA: Diagnosis not present

## 2022-09-22 DIAGNOSIS — E1122 Type 2 diabetes mellitus with diabetic chronic kidney disease: Secondary | ICD-10-CM | POA: Diagnosis not present

## 2022-09-22 DIAGNOSIS — M810 Age-related osteoporosis without current pathological fracture: Secondary | ICD-10-CM | POA: Diagnosis not present

## 2022-09-22 DIAGNOSIS — I10 Essential (primary) hypertension: Secondary | ICD-10-CM | POA: Diagnosis not present

## 2022-09-24 DIAGNOSIS — E1159 Type 2 diabetes mellitus with other circulatory complications: Secondary | ICD-10-CM | POA: Diagnosis not present

## 2022-09-24 DIAGNOSIS — W19XXXD Unspecified fall, subsequent encounter: Secondary | ICD-10-CM | POA: Diagnosis not present

## 2022-09-24 DIAGNOSIS — E1169 Type 2 diabetes mellitus with other specified complication: Secondary | ICD-10-CM | POA: Diagnosis not present

## 2022-09-24 DIAGNOSIS — N183 Chronic kidney disease, stage 3 unspecified: Secondary | ICD-10-CM | POA: Diagnosis not present

## 2022-09-24 DIAGNOSIS — E785 Hyperlipidemia, unspecified: Secondary | ICD-10-CM | POA: Diagnosis not present

## 2022-09-24 DIAGNOSIS — S22089D Unspecified fracture of T11-T12 vertebra, subsequent encounter for fracture with routine healing: Secondary | ICD-10-CM | POA: Diagnosis not present

## 2022-09-24 DIAGNOSIS — J449 Chronic obstructive pulmonary disease, unspecified: Secondary | ICD-10-CM | POA: Diagnosis not present

## 2022-09-24 DIAGNOSIS — E1122 Type 2 diabetes mellitus with diabetic chronic kidney disease: Secondary | ICD-10-CM | POA: Diagnosis not present

## 2022-09-24 DIAGNOSIS — F5101 Primary insomnia: Secondary | ICD-10-CM | POA: Diagnosis not present

## 2022-09-24 DIAGNOSIS — I214 Non-ST elevation (NSTEMI) myocardial infarction: Secondary | ICD-10-CM | POA: Diagnosis not present

## 2022-09-24 DIAGNOSIS — S32019D Unspecified fracture of first lumbar vertebra, subsequent encounter for fracture with routine healing: Secondary | ICD-10-CM | POA: Diagnosis not present

## 2022-09-24 DIAGNOSIS — Z720 Tobacco use: Secondary | ICD-10-CM | POA: Diagnosis not present

## 2022-09-24 DIAGNOSIS — I129 Hypertensive chronic kidney disease with stage 1 through stage 4 chronic kidney disease, or unspecified chronic kidney disease: Secondary | ICD-10-CM | POA: Diagnosis not present

## 2022-09-24 DIAGNOSIS — I152 Hypertension secondary to endocrine disorders: Secondary | ICD-10-CM | POA: Diagnosis not present

## 2022-09-30 DIAGNOSIS — I152 Hypertension secondary to endocrine disorders: Secondary | ICD-10-CM | POA: Diagnosis not present

## 2022-09-30 DIAGNOSIS — F5101 Primary insomnia: Secondary | ICD-10-CM | POA: Diagnosis not present

## 2022-09-30 DIAGNOSIS — E1169 Type 2 diabetes mellitus with other specified complication: Secondary | ICD-10-CM | POA: Diagnosis not present

## 2022-09-30 DIAGNOSIS — E1122 Type 2 diabetes mellitus with diabetic chronic kidney disease: Secondary | ICD-10-CM | POA: Diagnosis not present

## 2022-09-30 DIAGNOSIS — N183 Chronic kidney disease, stage 3 unspecified: Secondary | ICD-10-CM | POA: Diagnosis not present

## 2022-09-30 DIAGNOSIS — W19XXXD Unspecified fall, subsequent encounter: Secondary | ICD-10-CM | POA: Diagnosis not present

## 2022-09-30 DIAGNOSIS — Z720 Tobacco use: Secondary | ICD-10-CM | POA: Diagnosis not present

## 2022-09-30 DIAGNOSIS — J449 Chronic obstructive pulmonary disease, unspecified: Secondary | ICD-10-CM | POA: Diagnosis not present

## 2022-09-30 DIAGNOSIS — E785 Hyperlipidemia, unspecified: Secondary | ICD-10-CM | POA: Diagnosis not present

## 2022-09-30 DIAGNOSIS — I129 Hypertensive chronic kidney disease with stage 1 through stage 4 chronic kidney disease, or unspecified chronic kidney disease: Secondary | ICD-10-CM | POA: Diagnosis not present

## 2022-09-30 DIAGNOSIS — E1159 Type 2 diabetes mellitus with other circulatory complications: Secondary | ICD-10-CM | POA: Diagnosis not present

## 2022-09-30 DIAGNOSIS — S32019D Unspecified fracture of first lumbar vertebra, subsequent encounter for fracture with routine healing: Secondary | ICD-10-CM | POA: Diagnosis not present

## 2022-09-30 DIAGNOSIS — I214 Non-ST elevation (NSTEMI) myocardial infarction: Secondary | ICD-10-CM | POA: Diagnosis not present

## 2022-09-30 DIAGNOSIS — S22089D Unspecified fracture of T11-T12 vertebra, subsequent encounter for fracture with routine healing: Secondary | ICD-10-CM | POA: Diagnosis not present

## 2022-10-22 DIAGNOSIS — R300 Dysuria: Secondary | ICD-10-CM | POA: Diagnosis not present

## 2022-10-22 DIAGNOSIS — R350 Frequency of micturition: Secondary | ICD-10-CM | POA: Diagnosis not present

## 2022-10-22 DIAGNOSIS — N390 Urinary tract infection, site not specified: Secondary | ICD-10-CM | POA: Diagnosis not present

## 2022-10-30 DIAGNOSIS — R0602 Shortness of breath: Secondary | ICD-10-CM | POA: Diagnosis not present

## 2022-10-30 DIAGNOSIS — Z5321 Procedure and treatment not carried out due to patient leaving prior to being seen by health care provider: Secondary | ICD-10-CM | POA: Diagnosis not present

## 2022-10-30 DIAGNOSIS — R079 Chest pain, unspecified: Secondary | ICD-10-CM | POA: Diagnosis not present

## 2022-10-30 DIAGNOSIS — R9431 Abnormal electrocardiogram [ECG] [EKG]: Secondary | ICD-10-CM | POA: Diagnosis not present

## 2022-10-30 DIAGNOSIS — S22000D Wedge compression fracture of unspecified thoracic vertebra, subsequent encounter for fracture with routine healing: Secondary | ICD-10-CM | POA: Diagnosis not present

## 2022-10-30 DIAGNOSIS — S32009A Unspecified fracture of unspecified lumbar vertebra, initial encounter for closed fracture: Secondary | ICD-10-CM | POA: Diagnosis not present

## 2022-10-30 DIAGNOSIS — I25110 Atherosclerotic heart disease of native coronary artery with unstable angina pectoris: Secondary | ICD-10-CM | POA: Diagnosis not present

## 2022-10-30 DIAGNOSIS — N390 Urinary tract infection, site not specified: Secondary | ICD-10-CM | POA: Diagnosis not present

## 2022-10-31 DIAGNOSIS — R9431 Abnormal electrocardiogram [ECG] [EKG]: Secondary | ICD-10-CM | POA: Diagnosis not present

## 2022-11-02 DIAGNOSIS — R0982 Postnasal drip: Secondary | ICD-10-CM | POA: Diagnosis not present

## 2022-11-02 DIAGNOSIS — R059 Cough, unspecified: Secondary | ICD-10-CM | POA: Diagnosis not present

## 2022-11-02 DIAGNOSIS — Z20822 Contact with and (suspected) exposure to covid-19: Secondary | ICD-10-CM | POA: Diagnosis not present

## 2022-11-02 DIAGNOSIS — R062 Wheezing: Secondary | ICD-10-CM | POA: Diagnosis not present

## 2022-11-04 DIAGNOSIS — Z9181 History of falling: Secondary | ICD-10-CM | POA: Diagnosis not present

## 2022-11-04 DIAGNOSIS — F17210 Nicotine dependence, cigarettes, uncomplicated: Secondary | ICD-10-CM | POA: Diagnosis not present

## 2022-11-04 DIAGNOSIS — Z7902 Long term (current) use of antithrombotics/antiplatelets: Secondary | ICD-10-CM | POA: Diagnosis not present

## 2022-11-04 DIAGNOSIS — R262 Difficulty in walking, not elsewhere classified: Secondary | ICD-10-CM | POA: Diagnosis not present

## 2022-11-04 DIAGNOSIS — D72828 Other elevated white blood cell count: Secondary | ICD-10-CM | POA: Diagnosis not present

## 2022-11-04 DIAGNOSIS — M810 Age-related osteoporosis without current pathological fracture: Secondary | ICD-10-CM | POA: Diagnosis not present

## 2022-11-04 DIAGNOSIS — S40021A Contusion of right upper arm, initial encounter: Secondary | ICD-10-CM | POA: Diagnosis not present

## 2022-11-04 DIAGNOSIS — M549 Dorsalgia, unspecified: Secondary | ICD-10-CM | POA: Diagnosis not present

## 2022-11-04 DIAGNOSIS — S40022A Contusion of left upper arm, initial encounter: Secondary | ICD-10-CM | POA: Diagnosis not present

## 2022-11-04 DIAGNOSIS — R6889 Other general symptoms and signs: Secondary | ICD-10-CM | POA: Diagnosis not present

## 2022-11-04 DIAGNOSIS — N189 Chronic kidney disease, unspecified: Secondary | ICD-10-CM | POA: Diagnosis not present

## 2022-11-04 DIAGNOSIS — G8929 Other chronic pain: Secondary | ICD-10-CM | POA: Diagnosis not present

## 2022-11-04 DIAGNOSIS — Z79899 Other long term (current) drug therapy: Secondary | ICD-10-CM | POA: Diagnosis not present

## 2022-11-04 DIAGNOSIS — J302 Other seasonal allergic rhinitis: Secondary | ICD-10-CM | POA: Diagnosis not present

## 2022-11-04 DIAGNOSIS — M545 Low back pain, unspecified: Secondary | ICD-10-CM | POA: Diagnosis not present

## 2022-11-04 DIAGNOSIS — M546 Pain in thoracic spine: Secondary | ICD-10-CM | POA: Diagnosis not present

## 2022-11-04 DIAGNOSIS — J449 Chronic obstructive pulmonary disease, unspecified: Secondary | ICD-10-CM | POA: Diagnosis not present

## 2022-11-04 DIAGNOSIS — E1122 Type 2 diabetes mellitus with diabetic chronic kidney disease: Secondary | ICD-10-CM | POA: Diagnosis not present

## 2022-11-04 DIAGNOSIS — Z743 Need for continuous supervision: Secondary | ICD-10-CM | POA: Diagnosis not present

## 2022-11-04 DIAGNOSIS — E1165 Type 2 diabetes mellitus with hyperglycemia: Secondary | ICD-10-CM | POA: Diagnosis not present

## 2022-11-04 DIAGNOSIS — I129 Hypertensive chronic kidney disease with stage 1 through stage 4 chronic kidney disease, or unspecified chronic kidney disease: Secondary | ICD-10-CM | POA: Diagnosis not present

## 2022-11-04 DIAGNOSIS — M25551 Pain in right hip: Secondary | ICD-10-CM | POA: Diagnosis not present

## 2022-11-04 DIAGNOSIS — M25552 Pain in left hip: Secondary | ICD-10-CM | POA: Diagnosis not present

## 2022-11-04 DIAGNOSIS — Z7982 Long term (current) use of aspirin: Secondary | ICD-10-CM | POA: Diagnosis not present

## 2022-11-04 DIAGNOSIS — I252 Old myocardial infarction: Secondary | ICD-10-CM | POA: Diagnosis not present

## 2022-11-04 DIAGNOSIS — I493 Ventricular premature depolarization: Secondary | ICD-10-CM | POA: Diagnosis not present

## 2022-11-05 DIAGNOSIS — I129 Hypertensive chronic kidney disease with stage 1 through stage 4 chronic kidney disease, or unspecified chronic kidney disease: Secondary | ICD-10-CM | POA: Diagnosis not present

## 2022-11-05 DIAGNOSIS — R5381 Other malaise: Secondary | ICD-10-CM | POA: Diagnosis not present

## 2022-11-05 DIAGNOSIS — I214 Non-ST elevation (NSTEMI) myocardial infarction: Secondary | ICD-10-CM | POA: Diagnosis not present

## 2022-11-05 DIAGNOSIS — E1122 Type 2 diabetes mellitus with diabetic chronic kidney disease: Secondary | ICD-10-CM | POA: Diagnosis not present

## 2022-11-05 DIAGNOSIS — Z79899 Other long term (current) drug therapy: Secondary | ICD-10-CM | POA: Diagnosis not present

## 2022-11-05 DIAGNOSIS — J449 Chronic obstructive pulmonary disease, unspecified: Secondary | ICD-10-CM | POA: Diagnosis not present

## 2022-11-05 DIAGNOSIS — R296 Repeated falls: Secondary | ICD-10-CM | POA: Diagnosis not present

## 2022-11-05 DIAGNOSIS — Z8673 Personal history of transient ischemic attack (TIA), and cerebral infarction without residual deficits: Secondary | ICD-10-CM | POA: Diagnosis not present

## 2022-11-05 DIAGNOSIS — N189 Chronic kidney disease, unspecified: Secondary | ICD-10-CM | POA: Diagnosis not present

## 2022-11-05 DIAGNOSIS — G8929 Other chronic pain: Secondary | ICD-10-CM | POA: Diagnosis not present

## 2022-11-05 DIAGNOSIS — J302 Other seasonal allergic rhinitis: Secondary | ICD-10-CM | POA: Diagnosis not present

## 2022-11-06 DIAGNOSIS — E1122 Type 2 diabetes mellitus with diabetic chronic kidney disease: Secondary | ICD-10-CM | POA: Diagnosis not present

## 2022-11-06 DIAGNOSIS — I129 Hypertensive chronic kidney disease with stage 1 through stage 4 chronic kidney disease, or unspecified chronic kidney disease: Secondary | ICD-10-CM | POA: Diagnosis not present

## 2022-11-06 DIAGNOSIS — G894 Chronic pain syndrome: Secondary | ICD-10-CM | POA: Diagnosis not present

## 2022-11-06 DIAGNOSIS — R296 Repeated falls: Secondary | ICD-10-CM | POA: Diagnosis not present

## 2022-11-06 DIAGNOSIS — Z8679 Personal history of other diseases of the circulatory system: Secondary | ICD-10-CM | POA: Diagnosis not present

## 2022-11-06 DIAGNOSIS — J302 Other seasonal allergic rhinitis: Secondary | ICD-10-CM | POA: Diagnosis not present

## 2022-11-06 DIAGNOSIS — N189 Chronic kidney disease, unspecified: Secondary | ICD-10-CM | POA: Diagnosis not present

## 2022-11-07 DIAGNOSIS — Z8679 Personal history of other diseases of the circulatory system: Secondary | ICD-10-CM | POA: Diagnosis not present

## 2022-11-07 DIAGNOSIS — R262 Difficulty in walking, not elsewhere classified: Secondary | ICD-10-CM | POA: Diagnosis not present

## 2022-11-07 DIAGNOSIS — S32001A Stable burst fracture of unspecified lumbar vertebra, initial encounter for closed fracture: Secondary | ICD-10-CM | POA: Diagnosis not present

## 2022-11-07 DIAGNOSIS — S40021A Contusion of right upper arm, initial encounter: Secondary | ICD-10-CM | POA: Diagnosis not present

## 2022-11-07 DIAGNOSIS — S40022A Contusion of left upper arm, initial encounter: Secondary | ICD-10-CM | POA: Diagnosis not present

## 2022-11-07 DIAGNOSIS — M546 Pain in thoracic spine: Secondary | ICD-10-CM | POA: Diagnosis not present

## 2022-11-07 DIAGNOSIS — N183 Chronic kidney disease, stage 3 unspecified: Secondary | ICD-10-CM | POA: Diagnosis not present

## 2022-11-07 DIAGNOSIS — E1165 Type 2 diabetes mellitus with hyperglycemia: Secondary | ICD-10-CM | POA: Diagnosis not present

## 2022-11-07 DIAGNOSIS — G894 Chronic pain syndrome: Secondary | ICD-10-CM | POA: Diagnosis not present

## 2022-11-07 DIAGNOSIS — J302 Other seasonal allergic rhinitis: Secondary | ICD-10-CM | POA: Diagnosis not present

## 2022-11-07 DIAGNOSIS — Z79899 Other long term (current) drug therapy: Secondary | ICD-10-CM | POA: Diagnosis not present

## 2022-11-07 DIAGNOSIS — E1122 Type 2 diabetes mellitus with diabetic chronic kidney disease: Secondary | ICD-10-CM | POA: Diagnosis not present

## 2022-11-07 DIAGNOSIS — N189 Chronic kidney disease, unspecified: Secondary | ICD-10-CM | POA: Diagnosis not present

## 2022-11-07 DIAGNOSIS — R296 Repeated falls: Secondary | ICD-10-CM | POA: Diagnosis not present

## 2022-11-07 DIAGNOSIS — M810 Age-related osteoporosis without current pathological fracture: Secondary | ICD-10-CM | POA: Diagnosis not present

## 2022-11-07 DIAGNOSIS — N3001 Acute cystitis with hematuria: Secondary | ICD-10-CM | POA: Diagnosis not present

## 2022-11-07 DIAGNOSIS — Z7902 Long term (current) use of antithrombotics/antiplatelets: Secondary | ICD-10-CM | POA: Diagnosis not present

## 2022-11-07 DIAGNOSIS — D72828 Other elevated white blood cell count: Secondary | ICD-10-CM | POA: Diagnosis not present

## 2022-11-07 DIAGNOSIS — I252 Old myocardial infarction: Secondary | ICD-10-CM | POA: Diagnosis not present

## 2022-11-07 DIAGNOSIS — M545 Low back pain, unspecified: Secondary | ICD-10-CM | POA: Diagnosis not present

## 2022-11-07 DIAGNOSIS — Z7409 Other reduced mobility: Secondary | ICD-10-CM | POA: Diagnosis not present

## 2022-11-07 DIAGNOSIS — Z9181 History of falling: Secondary | ICD-10-CM | POA: Diagnosis not present

## 2022-11-07 DIAGNOSIS — G8929 Other chronic pain: Secondary | ICD-10-CM | POA: Diagnosis not present

## 2022-11-07 DIAGNOSIS — I6200 Nontraumatic subdural hemorrhage, unspecified: Secondary | ICD-10-CM | POA: Diagnosis not present

## 2022-11-07 DIAGNOSIS — J449 Chronic obstructive pulmonary disease, unspecified: Secondary | ICD-10-CM | POA: Diagnosis not present

## 2022-11-07 DIAGNOSIS — I129 Hypertensive chronic kidney disease with stage 1 through stage 4 chronic kidney disease, or unspecified chronic kidney disease: Secondary | ICD-10-CM | POA: Diagnosis not present

## 2022-11-07 DIAGNOSIS — Z7982 Long term (current) use of aspirin: Secondary | ICD-10-CM | POA: Diagnosis not present

## 2022-11-07 DIAGNOSIS — S2222XA Fracture of body of sternum, initial encounter for closed fracture: Secondary | ICD-10-CM | POA: Diagnosis not present

## 2022-11-07 DIAGNOSIS — I493 Ventricular premature depolarization: Secondary | ICD-10-CM | POA: Diagnosis not present

## 2022-11-07 DIAGNOSIS — F17210 Nicotine dependence, cigarettes, uncomplicated: Secondary | ICD-10-CM | POA: Diagnosis not present

## 2022-11-10 DIAGNOSIS — N183 Chronic kidney disease, stage 3 unspecified: Secondary | ICD-10-CM | POA: Diagnosis not present

## 2022-11-10 DIAGNOSIS — E1122 Type 2 diabetes mellitus with diabetic chronic kidney disease: Secondary | ICD-10-CM | POA: Diagnosis not present

## 2022-11-10 DIAGNOSIS — R262 Difficulty in walking, not elsewhere classified: Secondary | ICD-10-CM | POA: Diagnosis not present

## 2022-11-10 DIAGNOSIS — S32001A Stable burst fracture of unspecified lumbar vertebra, initial encounter for closed fracture: Secondary | ICD-10-CM | POA: Diagnosis not present

## 2022-11-20 DIAGNOSIS — R296 Repeated falls: Secondary | ICD-10-CM | POA: Diagnosis not present

## 2022-11-20 DIAGNOSIS — G4700 Insomnia, unspecified: Secondary | ICD-10-CM | POA: Diagnosis not present

## 2022-11-20 DIAGNOSIS — S32009A Unspecified fracture of unspecified lumbar vertebra, initial encounter for closed fracture: Secondary | ICD-10-CM | POA: Diagnosis not present

## 2022-11-20 DIAGNOSIS — E1165 Type 2 diabetes mellitus with hyperglycemia: Secondary | ICD-10-CM | POA: Diagnosis not present

## 2022-11-25 DIAGNOSIS — M8008XA Age-related osteoporosis with current pathological fracture, vertebra(e), initial encounter for fracture: Secondary | ICD-10-CM | POA: Diagnosis not present

## 2022-11-25 DIAGNOSIS — I252 Old myocardial infarction: Secondary | ICD-10-CM | POA: Diagnosis not present

## 2022-11-25 DIAGNOSIS — R531 Weakness: Secondary | ICD-10-CM | POA: Diagnosis not present

## 2022-11-25 DIAGNOSIS — G8929 Other chronic pain: Secondary | ICD-10-CM | POA: Diagnosis not present

## 2022-11-25 DIAGNOSIS — Z79899 Other long term (current) drug therapy: Secondary | ICD-10-CM | POA: Diagnosis not present

## 2022-11-25 DIAGNOSIS — Z7984 Long term (current) use of oral hypoglycemic drugs: Secondary | ICD-10-CM | POA: Diagnosis not present

## 2022-11-25 DIAGNOSIS — G4700 Insomnia, unspecified: Secondary | ICD-10-CM | POA: Diagnosis not present

## 2022-11-25 DIAGNOSIS — F17210 Nicotine dependence, cigarettes, uncomplicated: Secondary | ICD-10-CM | POA: Diagnosis not present

## 2022-11-25 DIAGNOSIS — I129 Hypertensive chronic kidney disease with stage 1 through stage 4 chronic kidney disease, or unspecified chronic kidney disease: Secondary | ICD-10-CM | POA: Diagnosis not present

## 2022-11-25 DIAGNOSIS — Z885 Allergy status to narcotic agent status: Secondary | ICD-10-CM | POA: Diagnosis not present

## 2022-11-25 DIAGNOSIS — Z743 Need for continuous supervision: Secondary | ICD-10-CM | POA: Diagnosis not present

## 2022-11-25 DIAGNOSIS — E1122 Type 2 diabetes mellitus with diabetic chronic kidney disease: Secondary | ICD-10-CM | POA: Diagnosis not present

## 2022-11-25 DIAGNOSIS — R296 Repeated falls: Secondary | ICD-10-CM | POA: Diagnosis not present

## 2022-11-25 DIAGNOSIS — J302 Other seasonal allergic rhinitis: Secondary | ICD-10-CM | POA: Diagnosis not present

## 2022-11-25 DIAGNOSIS — E1140 Type 2 diabetes mellitus with diabetic neuropathy, unspecified: Secondary | ICD-10-CM | POA: Diagnosis not present

## 2022-11-25 DIAGNOSIS — J449 Chronic obstructive pulmonary disease, unspecified: Secondary | ICD-10-CM | POA: Diagnosis not present

## 2022-11-25 DIAGNOSIS — M549 Dorsalgia, unspecified: Secondary | ICD-10-CM | POA: Diagnosis not present

## 2022-11-25 DIAGNOSIS — M25552 Pain in left hip: Secondary | ICD-10-CM | POA: Diagnosis not present

## 2022-11-25 DIAGNOSIS — M25522 Pain in left elbow: Secondary | ICD-10-CM | POA: Diagnosis not present

## 2022-11-25 DIAGNOSIS — E785 Hyperlipidemia, unspecified: Secondary | ICD-10-CM | POA: Diagnosis not present

## 2022-11-25 DIAGNOSIS — N183 Chronic kidney disease, stage 3 unspecified: Secondary | ICD-10-CM | POA: Diagnosis not present

## 2022-11-26 DIAGNOSIS — G8929 Other chronic pain: Secondary | ICD-10-CM | POA: Diagnosis not present

## 2022-11-26 DIAGNOSIS — R296 Repeated falls: Secondary | ICD-10-CM | POA: Diagnosis not present

## 2022-11-26 DIAGNOSIS — I639 Cerebral infarction, unspecified: Secondary | ICD-10-CM | POA: Diagnosis not present

## 2022-11-26 DIAGNOSIS — F32A Depression, unspecified: Secondary | ICD-10-CM | POA: Diagnosis not present

## 2022-11-26 DIAGNOSIS — G4700 Insomnia, unspecified: Secondary | ICD-10-CM | POA: Diagnosis not present

## 2022-11-26 DIAGNOSIS — J449 Chronic obstructive pulmonary disease, unspecified: Secondary | ICD-10-CM | POA: Diagnosis not present

## 2022-11-26 DIAGNOSIS — J302 Other seasonal allergic rhinitis: Secondary | ICD-10-CM | POA: Diagnosis not present

## 2022-11-26 DIAGNOSIS — I129 Hypertensive chronic kidney disease with stage 1 through stage 4 chronic kidney disease, or unspecified chronic kidney disease: Secondary | ICD-10-CM | POA: Diagnosis not present

## 2022-11-26 DIAGNOSIS — N189 Chronic kidney disease, unspecified: Secondary | ICD-10-CM | POA: Diagnosis not present

## 2022-11-26 DIAGNOSIS — E1122 Type 2 diabetes mellitus with diabetic chronic kidney disease: Secondary | ICD-10-CM | POA: Diagnosis not present

## 2022-11-26 DIAGNOSIS — R5381 Other malaise: Secondary | ICD-10-CM | POA: Diagnosis not present

## 2022-11-27 DIAGNOSIS — R296 Repeated falls: Secondary | ICD-10-CM | POA: Diagnosis not present

## 2022-11-28 DIAGNOSIS — I252 Old myocardial infarction: Secondary | ICD-10-CM | POA: Diagnosis not present

## 2022-11-28 DIAGNOSIS — G4733 Obstructive sleep apnea (adult) (pediatric): Secondary | ICD-10-CM | POA: Diagnosis not present

## 2022-11-28 DIAGNOSIS — M792 Neuralgia and neuritis, unspecified: Secondary | ICD-10-CM | POA: Diagnosis not present

## 2022-11-28 DIAGNOSIS — M25552 Pain in left hip: Secondary | ICD-10-CM | POA: Diagnosis not present

## 2022-11-28 DIAGNOSIS — E1140 Type 2 diabetes mellitus with diabetic neuropathy, unspecified: Secondary | ICD-10-CM | POA: Diagnosis not present

## 2022-11-28 DIAGNOSIS — R296 Repeated falls: Secondary | ICD-10-CM | POA: Diagnosis not present

## 2022-11-28 DIAGNOSIS — R5381 Other malaise: Secondary | ICD-10-CM | POA: Diagnosis not present

## 2022-11-28 DIAGNOSIS — I679 Cerebrovascular disease, unspecified: Secondary | ICD-10-CM | POA: Diagnosis not present

## 2022-11-28 DIAGNOSIS — G4700 Insomnia, unspecified: Secondary | ICD-10-CM | POA: Diagnosis not present

## 2022-11-28 DIAGNOSIS — W19XXXD Unspecified fall, subsequent encounter: Secondary | ICD-10-CM | POA: Diagnosis not present

## 2022-11-28 DIAGNOSIS — S22060D Wedge compression fracture of T7-T8 vertebra, subsequent encounter for fracture with routine healing: Secondary | ICD-10-CM | POA: Diagnosis not present

## 2022-11-28 DIAGNOSIS — M6281 Muscle weakness (generalized): Secondary | ICD-10-CM | POA: Diagnosis not present

## 2022-11-28 DIAGNOSIS — I129 Hypertensive chronic kidney disease with stage 1 through stage 4 chronic kidney disease, or unspecified chronic kidney disease: Secondary | ICD-10-CM | POA: Diagnosis not present

## 2022-11-28 DIAGNOSIS — M549 Dorsalgia, unspecified: Secondary | ICD-10-CM | POA: Diagnosis not present

## 2022-11-28 DIAGNOSIS — E119 Type 2 diabetes mellitus without complications: Secondary | ICD-10-CM | POA: Diagnosis not present

## 2022-11-28 DIAGNOSIS — F17210 Nicotine dependence, cigarettes, uncomplicated: Secondary | ICD-10-CM | POA: Diagnosis not present

## 2022-11-28 DIAGNOSIS — M1990 Unspecified osteoarthritis, unspecified site: Secondary | ICD-10-CM | POA: Diagnosis not present

## 2022-11-28 DIAGNOSIS — G8929 Other chronic pain: Secondary | ICD-10-CM | POA: Diagnosis not present

## 2022-11-28 DIAGNOSIS — E785 Hyperlipidemia, unspecified: Secondary | ICD-10-CM | POA: Diagnosis not present

## 2022-11-28 DIAGNOSIS — I6203 Nontraumatic chronic subdural hemorrhage: Secondary | ICD-10-CM | POA: Diagnosis not present

## 2022-11-28 DIAGNOSIS — I259 Chronic ischemic heart disease, unspecified: Secondary | ICD-10-CM | POA: Diagnosis not present

## 2022-11-28 DIAGNOSIS — E1122 Type 2 diabetes mellitus with diabetic chronic kidney disease: Secondary | ICD-10-CM | POA: Diagnosis not present

## 2022-11-28 DIAGNOSIS — M810 Age-related osteoporosis without current pathological fracture: Secondary | ICD-10-CM | POA: Diagnosis not present

## 2022-11-28 DIAGNOSIS — E1165 Type 2 diabetes mellitus with hyperglycemia: Secondary | ICD-10-CM | POA: Diagnosis not present

## 2022-11-28 DIAGNOSIS — S22080D Wedge compression fracture of T11-T12 vertebra, subsequent encounter for fracture with routine healing: Secondary | ICD-10-CM | POA: Diagnosis not present

## 2022-11-28 DIAGNOSIS — M25522 Pain in left elbow: Secondary | ICD-10-CM | POA: Diagnosis not present

## 2022-11-28 DIAGNOSIS — I2510 Atherosclerotic heart disease of native coronary artery without angina pectoris: Secondary | ICD-10-CM | POA: Diagnosis not present

## 2022-11-28 DIAGNOSIS — R531 Weakness: Secondary | ICD-10-CM | POA: Diagnosis not present

## 2022-11-28 DIAGNOSIS — J302 Other seasonal allergic rhinitis: Secondary | ICD-10-CM | POA: Diagnosis not present

## 2022-11-28 DIAGNOSIS — Z79899 Other long term (current) drug therapy: Secondary | ICD-10-CM | POA: Diagnosis not present

## 2022-11-28 DIAGNOSIS — Z7984 Long term (current) use of oral hypoglycemic drugs: Secondary | ICD-10-CM | POA: Diagnosis not present

## 2022-11-28 DIAGNOSIS — Z885 Allergy status to narcotic agent status: Secondary | ICD-10-CM | POA: Diagnosis not present

## 2022-11-28 DIAGNOSIS — M8008XA Age-related osteoporosis with current pathological fracture, vertebra(e), initial encounter for fracture: Secondary | ICD-10-CM | POA: Diagnosis not present

## 2022-11-28 DIAGNOSIS — F32A Depression, unspecified: Secondary | ICD-10-CM | POA: Diagnosis not present

## 2022-11-28 DIAGNOSIS — J449 Chronic obstructive pulmonary disease, unspecified: Secondary | ICD-10-CM | POA: Diagnosis not present

## 2022-11-28 DIAGNOSIS — N189 Chronic kidney disease, unspecified: Secondary | ICD-10-CM | POA: Diagnosis not present

## 2022-11-28 DIAGNOSIS — N183 Chronic kidney disease, stage 3 unspecified: Secondary | ICD-10-CM | POA: Diagnosis not present

## 2022-11-28 DIAGNOSIS — R2681 Unsteadiness on feet: Secondary | ICD-10-CM | POA: Diagnosis not present

## 2022-11-28 IMAGING — CR DG HIP (WITH OR WITHOUT PELVIS) 3-4V BILAT
5 series · 5 of 5 positions shown · non-contrast
Comparison: None.

CLINICAL DATA: 74-year-old female with bilateral hip pain.

EXAM:
DG HIP (WITH OR WITHOUT PELVIS) 3-4V BILAT

[w pelvis *]
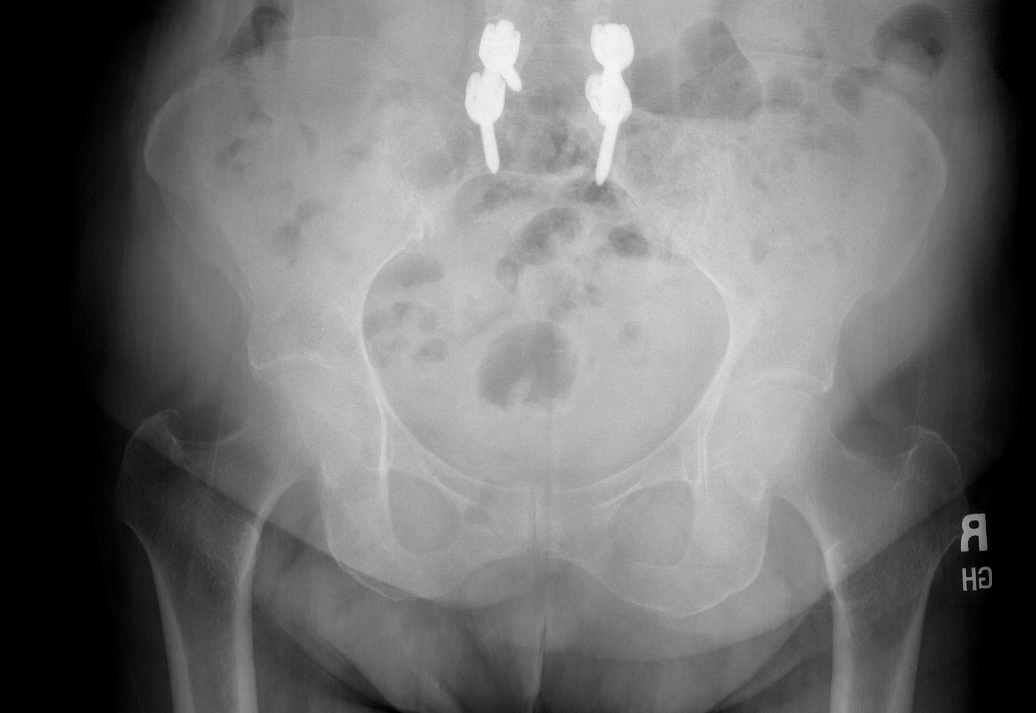

[t hip ap left]
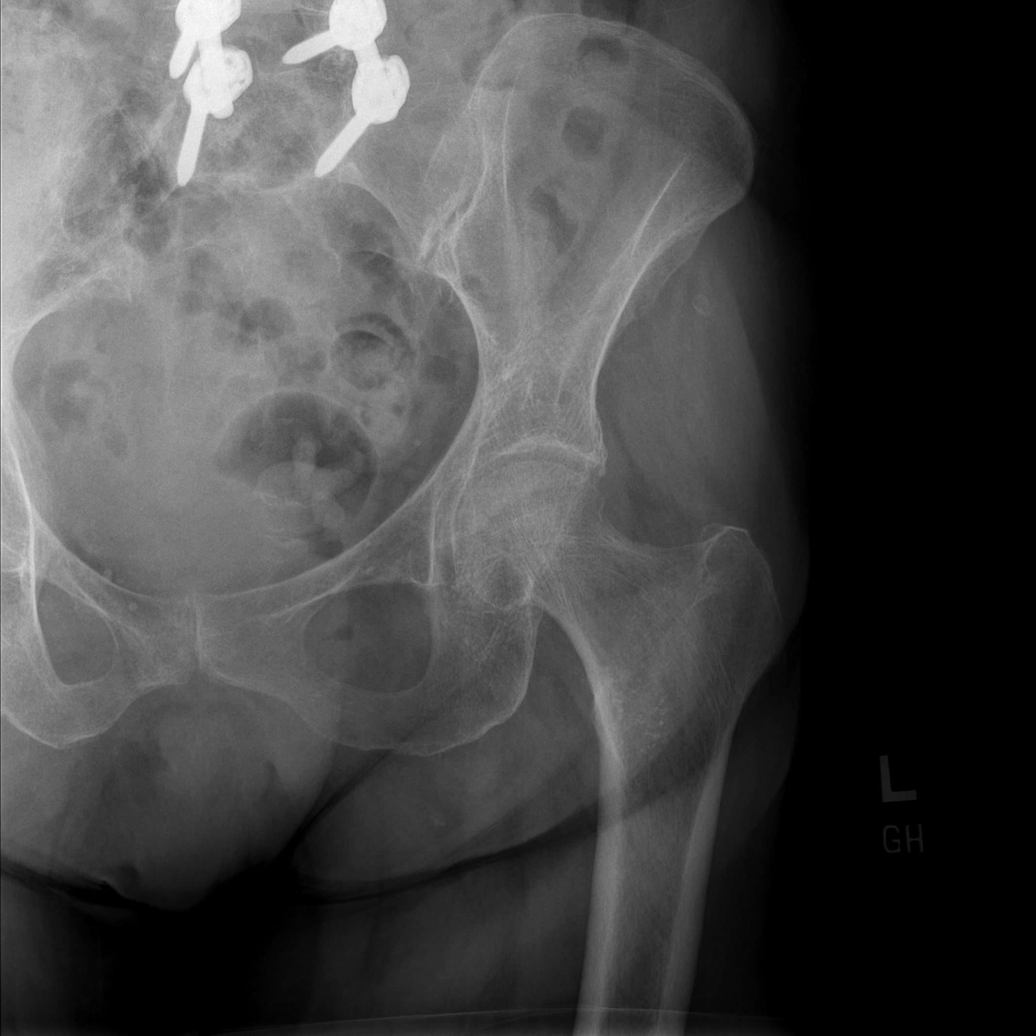

[t hip frog leg left]
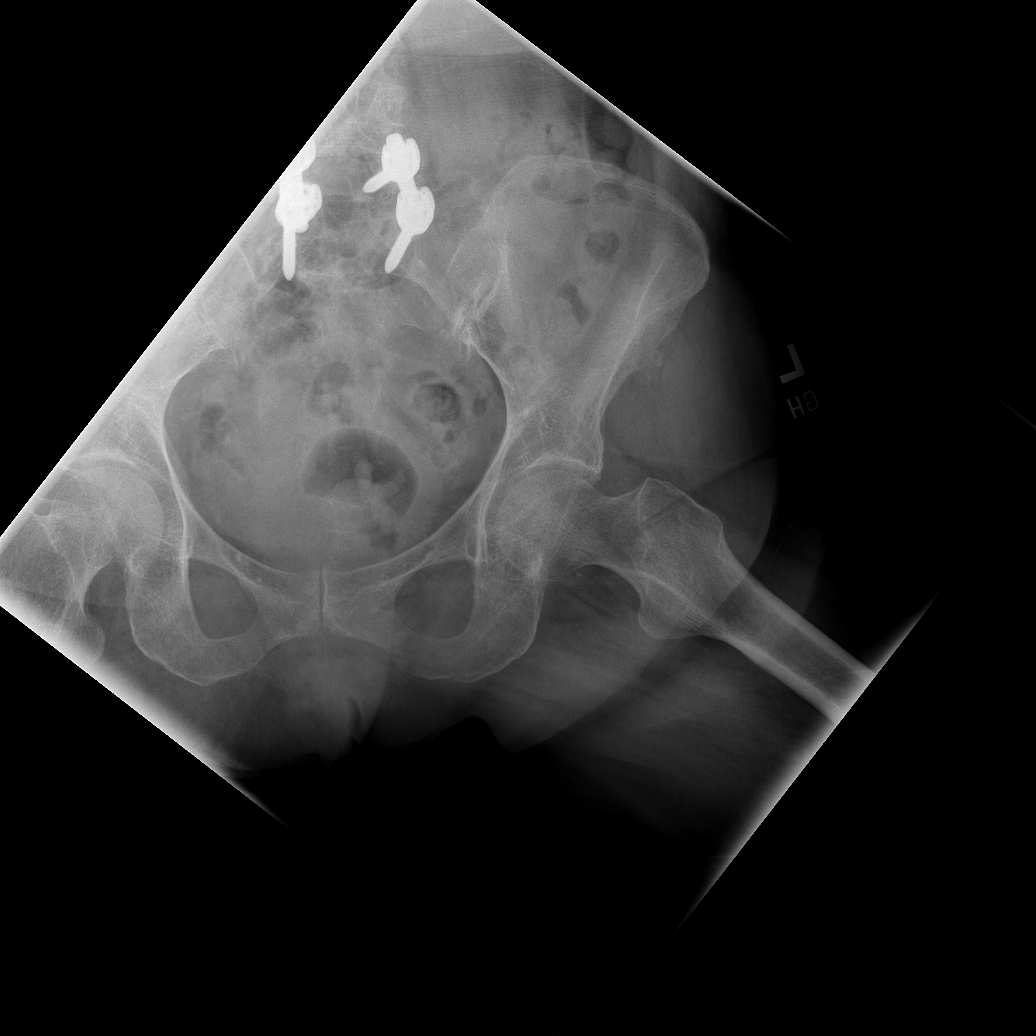

[t hip ap right]
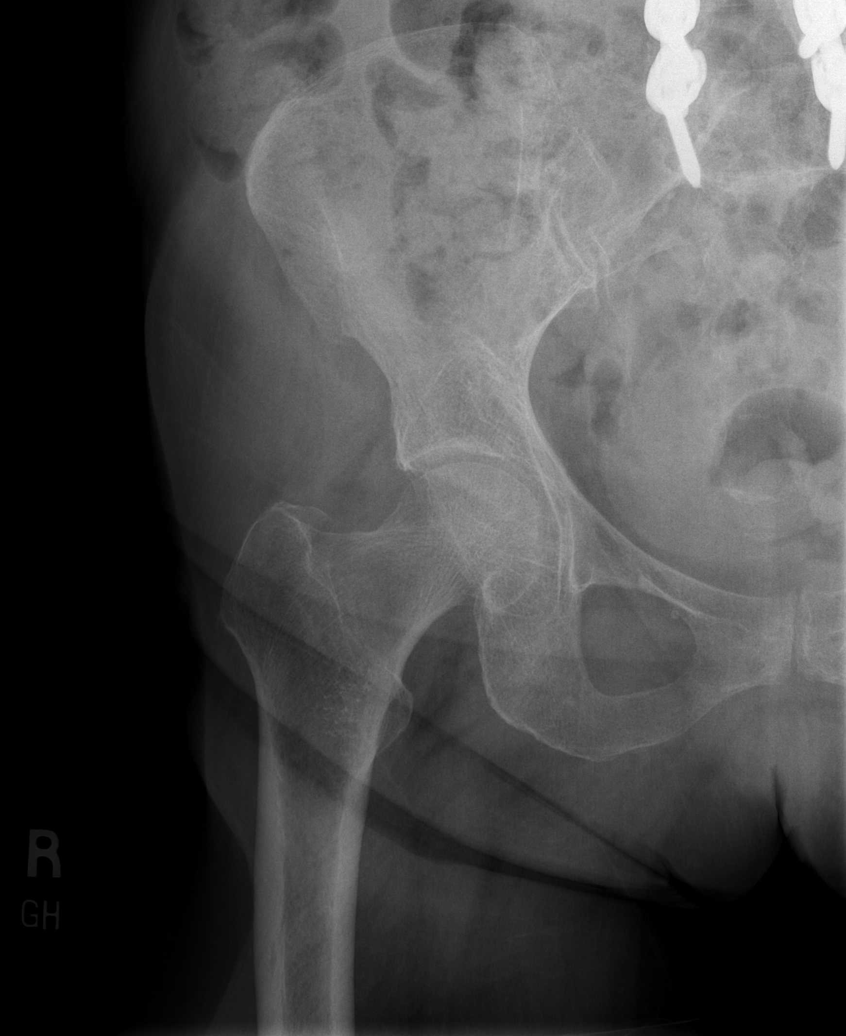

[t hip frog leg right]
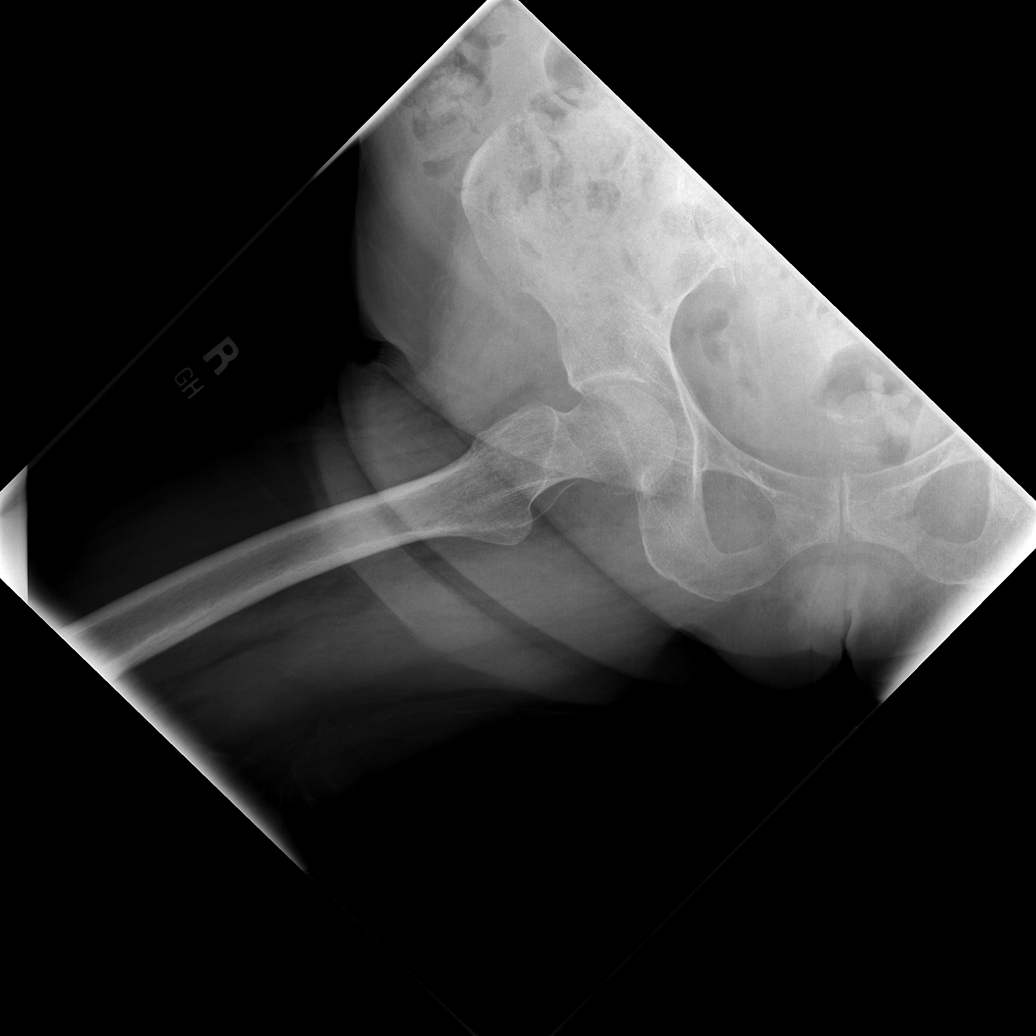

[5 of 5 positions shown; findings below may reference images not displayed]

FINDINGS: There is no acute fracture or dislocation. The bones are osteopenic.
Mild bilateral hip arthritic changes. Lower lumbar fusion. The soft
tissues are grossly unremarkable.
IMPRESSION: 1. No acute fracture or dislocation.
2. Osteopenia and mild arthritic changes of the hips.

## 2022-12-05 DIAGNOSIS — I259 Chronic ischemic heart disease, unspecified: Secondary | ICD-10-CM | POA: Diagnosis not present

## 2022-12-05 DIAGNOSIS — E119 Type 2 diabetes mellitus without complications: Secondary | ICD-10-CM | POA: Diagnosis not present

## 2022-12-05 DIAGNOSIS — S22080D Wedge compression fracture of T11-T12 vertebra, subsequent encounter for fracture with routine healing: Secondary | ICD-10-CM | POA: Diagnosis not present

## 2022-12-30 DIAGNOSIS — M47816 Spondylosis without myelopathy or radiculopathy, lumbar region: Secondary | ICD-10-CM | POA: Diagnosis not present

## 2022-12-30 DIAGNOSIS — M5417 Radiculopathy, lumbosacral region: Secondary | ICD-10-CM | POA: Diagnosis not present

## 2022-12-30 DIAGNOSIS — M461 Sacroiliitis, not elsewhere classified: Secondary | ICD-10-CM | POA: Diagnosis not present

## 2022-12-30 DIAGNOSIS — Z79891 Long term (current) use of opiate analgesic: Secondary | ICD-10-CM | POA: Diagnosis not present

## 2022-12-30 DIAGNOSIS — R296 Repeated falls: Secondary | ICD-10-CM | POA: Diagnosis not present

## 2022-12-30 DIAGNOSIS — M5136 Other intervertebral disc degeneration, lumbar region: Secondary | ICD-10-CM | POA: Diagnosis not present

## 2022-12-30 DIAGNOSIS — M47817 Spondylosis without myelopathy or radiculopathy, lumbosacral region: Secondary | ICD-10-CM | POA: Diagnosis not present

## 2022-12-30 DIAGNOSIS — M546 Pain in thoracic spine: Secondary | ICD-10-CM | POA: Diagnosis not present

## 2022-12-30 DIAGNOSIS — M545 Low back pain, unspecified: Secondary | ICD-10-CM | POA: Diagnosis not present

## 2022-12-30 DIAGNOSIS — M5416 Radiculopathy, lumbar region: Secondary | ICD-10-CM | POA: Diagnosis not present

## 2022-12-30 DIAGNOSIS — M5126 Other intervertebral disc displacement, lumbar region: Secondary | ICD-10-CM | POA: Diagnosis not present

## 2022-12-30 DIAGNOSIS — M810 Age-related osteoporosis without current pathological fracture: Secondary | ICD-10-CM | POA: Diagnosis not present

## 2022-12-31 ENCOUNTER — Other Ambulatory Visit: Payer: Self-pay

## 2022-12-31 DIAGNOSIS — M5417 Radiculopathy, lumbosacral region: Secondary | ICD-10-CM

## 2023-01-01 ENCOUNTER — Other Ambulatory Visit: Payer: Self-pay | Admitting: Nurse Practitioner

## 2023-01-01 DIAGNOSIS — M5417 Radiculopathy, lumbosacral region: Secondary | ICD-10-CM

## 2023-01-01 DIAGNOSIS — M461 Sacroiliitis, not elsewhere classified: Secondary | ICD-10-CM | POA: Diagnosis not present

## 2023-01-01 DIAGNOSIS — Z79891 Long term (current) use of opiate analgesic: Secondary | ICD-10-CM | POA: Diagnosis not present

## 2023-01-07 ENCOUNTER — Ambulatory Visit
Admission: RE | Admit: 2023-01-07 | Discharge: 2023-01-07 | Disposition: A | Discharge status: Home / Self Care | Payer: 59 | Source: Ambulatory Visit | Attending: Nurse Practitioner | Admitting: Nurse Practitioner

## 2023-01-07 DIAGNOSIS — M5417 Radiculopathy, lumbosacral region: Secondary | ICD-10-CM | POA: Diagnosis not present

## 2023-01-07 DIAGNOSIS — M48061 Spinal stenosis, lumbar region without neurogenic claudication: Secondary | ICD-10-CM | POA: Diagnosis not present

## 2023-01-07 DIAGNOSIS — M4807 Spinal stenosis, lumbosacral region: Secondary | ICD-10-CM | POA: Diagnosis not present

## 2023-01-08 DIAGNOSIS — E1165 Type 2 diabetes mellitus with hyperglycemia: Secondary | ICD-10-CM | POA: Diagnosis not present

## 2023-01-08 DIAGNOSIS — R300 Dysuria: Secondary | ICD-10-CM | POA: Diagnosis not present

## 2023-01-08 DIAGNOSIS — G894 Chronic pain syndrome: Secondary | ICD-10-CM | POA: Diagnosis not present

## 2023-01-08 DIAGNOSIS — G4700 Insomnia, unspecified: Secondary | ICD-10-CM | POA: Diagnosis not present

## 2023-01-19 DIAGNOSIS — R296 Repeated falls: Secondary | ICD-10-CM | POA: Diagnosis not present

## 2023-01-19 DIAGNOSIS — M5136 Other intervertebral disc degeneration, lumbar region: Secondary | ICD-10-CM | POA: Diagnosis not present

## 2023-01-19 DIAGNOSIS — M47816 Spondylosis without myelopathy or radiculopathy, lumbar region: Secondary | ICD-10-CM | POA: Diagnosis not present

## 2023-01-19 DIAGNOSIS — M5416 Radiculopathy, lumbar region: Secondary | ICD-10-CM | POA: Diagnosis not present

## 2023-01-19 DIAGNOSIS — M47817 Spondylosis without myelopathy or radiculopathy, lumbosacral region: Secondary | ICD-10-CM | POA: Diagnosis not present

## 2023-01-19 DIAGNOSIS — M461 Sacroiliitis, not elsewhere classified: Secondary | ICD-10-CM | POA: Diagnosis not present

## 2023-01-19 DIAGNOSIS — M810 Age-related osteoporosis without current pathological fracture: Secondary | ICD-10-CM | POA: Diagnosis not present

## 2023-01-19 DIAGNOSIS — Z79891 Long term (current) use of opiate analgesic: Secondary | ICD-10-CM | POA: Diagnosis not present

## 2023-01-19 DIAGNOSIS — M545 Low back pain, unspecified: Secondary | ICD-10-CM | POA: Diagnosis not present

## 2023-01-19 DIAGNOSIS — M5126 Other intervertebral disc displacement, lumbar region: Secondary | ICD-10-CM | POA: Diagnosis not present

## 2023-01-19 DIAGNOSIS — M546 Pain in thoracic spine: Secondary | ICD-10-CM | POA: Diagnosis not present

## 2023-01-19 DIAGNOSIS — M5417 Radiculopathy, lumbosacral region: Secondary | ICD-10-CM | POA: Diagnosis not present

## 2023-02-10 DIAGNOSIS — M4854XA Collapsed vertebra, not elsewhere classified, thoracic region, initial encounter for fracture: Secondary | ICD-10-CM | POA: Diagnosis not present

## 2023-03-20 DIAGNOSIS — W19XXXA Unspecified fall, initial encounter: Secondary | ICD-10-CM | POA: Diagnosis not present

## 2023-03-20 DIAGNOSIS — Z7902 Long term (current) use of antithrombotics/antiplatelets: Secondary | ICD-10-CM | POA: Diagnosis not present

## 2023-03-20 DIAGNOSIS — M810 Age-related osteoporosis without current pathological fracture: Secondary | ICD-10-CM | POA: Diagnosis not present

## 2023-03-20 DIAGNOSIS — M4854XA Collapsed vertebra, not elsewhere classified, thoracic region, initial encounter for fracture: Secondary | ICD-10-CM | POA: Diagnosis not present

## 2023-03-20 DIAGNOSIS — M1990 Unspecified osteoarthritis, unspecified site: Secondary | ICD-10-CM | POA: Diagnosis not present

## 2023-03-20 DIAGNOSIS — N3289 Other specified disorders of bladder: Secondary | ICD-10-CM | POA: Diagnosis not present

## 2023-03-20 DIAGNOSIS — E1140 Type 2 diabetes mellitus with diabetic neuropathy, unspecified: Secondary | ICD-10-CM | POA: Diagnosis not present

## 2023-03-20 DIAGNOSIS — Z743 Need for continuous supervision: Secondary | ICD-10-CM | POA: Diagnosis not present

## 2023-03-20 DIAGNOSIS — E1169 Type 2 diabetes mellitus with other specified complication: Secondary | ICD-10-CM | POA: Diagnosis not present

## 2023-03-20 DIAGNOSIS — Z880 Allergy status to penicillin: Secondary | ICD-10-CM | POA: Diagnosis not present

## 2023-03-20 DIAGNOSIS — M47816 Spondylosis without myelopathy or radiculopathy, lumbar region: Secondary | ICD-10-CM | POA: Diagnosis not present

## 2023-03-20 DIAGNOSIS — R109 Unspecified abdominal pain: Secondary | ICD-10-CM | POA: Diagnosis not present

## 2023-03-20 DIAGNOSIS — Z66 Do not resuscitate: Secondary | ICD-10-CM | POA: Diagnosis not present

## 2023-03-20 DIAGNOSIS — M549 Dorsalgia, unspecified: Secondary | ICD-10-CM | POA: Diagnosis not present

## 2023-03-20 DIAGNOSIS — R339 Retention of urine, unspecified: Secondary | ICD-10-CM | POA: Diagnosis not present

## 2023-03-20 DIAGNOSIS — N390 Urinary tract infection, site not specified: Secondary | ICD-10-CM | POA: Diagnosis not present

## 2023-03-20 DIAGNOSIS — E785 Hyperlipidemia, unspecified: Secondary | ICD-10-CM | POA: Diagnosis not present

## 2023-03-20 DIAGNOSIS — Z79899 Other long term (current) drug therapy: Secondary | ICD-10-CM | POA: Diagnosis not present

## 2023-03-20 DIAGNOSIS — S3210XD Unspecified fracture of sacrum, subsequent encounter for fracture with routine healing: Secondary | ICD-10-CM | POA: Diagnosis not present

## 2023-03-20 DIAGNOSIS — I2510 Atherosclerotic heart disease of native coronary artery without angina pectoris: Secondary | ICD-10-CM | POA: Diagnosis not present

## 2023-03-20 DIAGNOSIS — S3210XA Unspecified fracture of sacrum, initial encounter for closed fracture: Secondary | ICD-10-CM | POA: Diagnosis not present

## 2023-03-20 DIAGNOSIS — I129 Hypertensive chronic kidney disease with stage 1 through stage 4 chronic kidney disease, or unspecified chronic kidney disease: Secondary | ICD-10-CM | POA: Diagnosis not present

## 2023-03-20 DIAGNOSIS — G8929 Other chronic pain: Secondary | ICD-10-CM | POA: Diagnosis not present

## 2023-03-20 DIAGNOSIS — E1122 Type 2 diabetes mellitus with diabetic chronic kidney disease: Secondary | ICD-10-CM | POA: Diagnosis not present

## 2023-03-20 DIAGNOSIS — M25552 Pain in left hip: Secondary | ICD-10-CM | POA: Diagnosis not present

## 2023-03-20 DIAGNOSIS — M48061 Spinal stenosis, lumbar region without neurogenic claudication: Secondary | ICD-10-CM | POA: Diagnosis not present

## 2023-03-20 DIAGNOSIS — F17210 Nicotine dependence, cigarettes, uncomplicated: Secondary | ICD-10-CM | POA: Diagnosis not present

## 2023-03-20 DIAGNOSIS — Z981 Arthrodesis status: Secondary | ICD-10-CM | POA: Diagnosis not present

## 2023-03-20 DIAGNOSIS — M5136 Other intervertebral disc degeneration, lumbar region: Secondary | ICD-10-CM | POA: Diagnosis not present

## 2023-03-20 DIAGNOSIS — G894 Chronic pain syndrome: Secondary | ICD-10-CM | POA: Diagnosis not present

## 2023-03-20 DIAGNOSIS — N189 Chronic kidney disease, unspecified: Secondary | ICD-10-CM | POA: Diagnosis not present

## 2023-03-20 DIAGNOSIS — E1165 Type 2 diabetes mellitus with hyperglycemia: Secondary | ICD-10-CM | POA: Diagnosis not present

## 2023-03-20 DIAGNOSIS — N183 Chronic kidney disease, stage 3 unspecified: Secondary | ICD-10-CM | POA: Diagnosis not present

## 2023-03-20 DIAGNOSIS — Z7982 Long term (current) use of aspirin: Secondary | ICD-10-CM | POA: Diagnosis not present

## 2023-03-20 DIAGNOSIS — M545 Low back pain, unspecified: Secondary | ICD-10-CM | POA: Diagnosis not present

## 2023-03-20 DIAGNOSIS — Z7984 Long term (current) use of oral hypoglycemic drugs: Secondary | ICD-10-CM | POA: Diagnosis not present

## 2023-03-20 DIAGNOSIS — Z882 Allergy status to sulfonamides status: Secondary | ICD-10-CM | POA: Diagnosis not present

## 2023-03-20 DIAGNOSIS — J449 Chronic obstructive pulmonary disease, unspecified: Secondary | ICD-10-CM | POA: Diagnosis not present

## 2023-03-20 DIAGNOSIS — G4700 Insomnia, unspecified: Secondary | ICD-10-CM | POA: Diagnosis not present

## 2023-04-13 DIAGNOSIS — R2681 Unsteadiness on feet: Secondary | ICD-10-CM | POA: Diagnosis not present

## 2023-04-13 DIAGNOSIS — N189 Chronic kidney disease, unspecified: Secondary | ICD-10-CM | POA: Diagnosis not present

## 2023-04-13 DIAGNOSIS — S3210XA Unspecified fracture of sacrum, initial encounter for closed fracture: Secondary | ICD-10-CM | POA: Diagnosis not present

## 2023-04-13 DIAGNOSIS — E1169 Type 2 diabetes mellitus with other specified complication: Secondary | ICD-10-CM | POA: Diagnosis not present

## 2023-04-13 DIAGNOSIS — E440 Moderate protein-calorie malnutrition: Secondary | ICD-10-CM | POA: Diagnosis not present

## 2023-04-13 DIAGNOSIS — E1140 Type 2 diabetes mellitus with diabetic neuropathy, unspecified: Secondary | ICD-10-CM | POA: Diagnosis not present

## 2023-04-13 DIAGNOSIS — R296 Repeated falls: Secondary | ICD-10-CM | POA: Diagnosis not present

## 2023-04-13 DIAGNOSIS — M7989 Other specified soft tissue disorders: Secondary | ICD-10-CM | POA: Diagnosis not present

## 2023-04-13 DIAGNOSIS — Z79899 Other long term (current) drug therapy: Secondary | ICD-10-CM | POA: Diagnosis not present

## 2023-04-13 DIAGNOSIS — M792 Neuralgia and neuritis, unspecified: Secondary | ICD-10-CM | POA: Diagnosis not present

## 2023-04-13 DIAGNOSIS — Z7984 Long term (current) use of oral hypoglycemic drugs: Secondary | ICD-10-CM | POA: Diagnosis not present

## 2023-04-13 DIAGNOSIS — G4700 Insomnia, unspecified: Secondary | ICD-10-CM | POA: Diagnosis not present

## 2023-04-13 DIAGNOSIS — E785 Hyperlipidemia, unspecified: Secondary | ICD-10-CM | POA: Diagnosis not present

## 2023-04-13 DIAGNOSIS — N1831 Chronic kidney disease, stage 3a: Secondary | ICD-10-CM | POA: Diagnosis not present

## 2023-04-13 DIAGNOSIS — S32592D Other specified fracture of left pubis, subsequent encounter for fracture with routine healing: Secondary | ICD-10-CM | POA: Diagnosis not present

## 2023-04-13 DIAGNOSIS — G8929 Other chronic pain: Secondary | ICD-10-CM | POA: Diagnosis not present

## 2023-04-13 DIAGNOSIS — Z882 Allergy status to sulfonamides status: Secondary | ICD-10-CM | POA: Diagnosis not present

## 2023-04-13 DIAGNOSIS — S32592A Other specified fracture of left pubis, initial encounter for closed fracture: Secondary | ICD-10-CM | POA: Diagnosis not present

## 2023-04-13 DIAGNOSIS — F17210 Nicotine dependence, cigarettes, uncomplicated: Secondary | ICD-10-CM | POA: Diagnosis not present

## 2023-04-13 DIAGNOSIS — F32A Depression, unspecified: Secondary | ICD-10-CM | POA: Diagnosis not present

## 2023-04-13 DIAGNOSIS — S32502A Unspecified fracture of left pubis, initial encounter for closed fracture: Secondary | ICD-10-CM | POA: Diagnosis not present

## 2023-04-13 DIAGNOSIS — I679 Cerebrovascular disease, unspecified: Secondary | ICD-10-CM | POA: Diagnosis not present

## 2023-04-13 DIAGNOSIS — Z880 Allergy status to penicillin: Secondary | ICD-10-CM | POA: Diagnosis not present

## 2023-04-13 DIAGNOSIS — M1990 Unspecified osteoarthritis, unspecified site: Secondary | ICD-10-CM | POA: Diagnosis not present

## 2023-04-13 DIAGNOSIS — I2510 Atherosclerotic heart disease of native coronary artery without angina pectoris: Secondary | ICD-10-CM | POA: Diagnosis not present

## 2023-04-13 DIAGNOSIS — M810 Age-related osteoporosis without current pathological fracture: Secondary | ICD-10-CM | POA: Diagnosis not present

## 2023-04-13 DIAGNOSIS — R1032 Left lower quadrant pain: Secondary | ICD-10-CM | POA: Diagnosis not present

## 2023-04-13 DIAGNOSIS — Z720 Tobacco use: Secondary | ICD-10-CM | POA: Diagnosis not present

## 2023-04-13 DIAGNOSIS — M6281 Muscle weakness (generalized): Secondary | ICD-10-CM | POA: Diagnosis not present

## 2023-04-13 DIAGNOSIS — Z981 Arthrodesis status: Secondary | ICD-10-CM | POA: Diagnosis not present

## 2023-04-13 DIAGNOSIS — I252 Old myocardial infarction: Secondary | ICD-10-CM | POA: Diagnosis not present

## 2023-04-13 DIAGNOSIS — Z66 Do not resuscitate: Secondary | ICD-10-CM | POA: Diagnosis not present

## 2023-04-13 DIAGNOSIS — S329XXD Fracture of unspecified parts of lumbosacral spine and pelvis, subsequent encounter for fracture with routine healing: Secondary | ICD-10-CM | POA: Diagnosis not present

## 2023-04-13 DIAGNOSIS — M545 Low back pain, unspecified: Secondary | ICD-10-CM | POA: Diagnosis not present

## 2023-04-13 DIAGNOSIS — N183 Chronic kidney disease, stage 3 unspecified: Secondary | ICD-10-CM | POA: Diagnosis not present

## 2023-04-13 DIAGNOSIS — Z743 Need for continuous supervision: Secondary | ICD-10-CM | POA: Diagnosis not present

## 2023-04-13 DIAGNOSIS — S32512A Fracture of superior rim of left pubis, initial encounter for closed fracture: Secondary | ICD-10-CM | POA: Diagnosis not present

## 2023-04-13 DIAGNOSIS — S3210XD Unspecified fracture of sacrum, subsequent encounter for fracture with routine healing: Secondary | ICD-10-CM | POA: Diagnosis not present

## 2023-04-13 DIAGNOSIS — N390 Urinary tract infection, site not specified: Secondary | ICD-10-CM | POA: Diagnosis not present

## 2023-04-13 DIAGNOSIS — Z9841 Cataract extraction status, right eye: Secondary | ICD-10-CM | POA: Diagnosis not present

## 2023-04-13 DIAGNOSIS — E1165 Type 2 diabetes mellitus with hyperglycemia: Secondary | ICD-10-CM | POA: Diagnosis not present

## 2023-04-13 DIAGNOSIS — I129 Hypertensive chronic kidney disease with stage 1 through stage 4 chronic kidney disease, or unspecified chronic kidney disease: Secondary | ICD-10-CM | POA: Diagnosis not present

## 2023-04-13 DIAGNOSIS — J449 Chronic obstructive pulmonary disease, unspecified: Secondary | ICD-10-CM | POA: Diagnosis not present

## 2023-04-13 DIAGNOSIS — E1122 Type 2 diabetes mellitus with diabetic chronic kidney disease: Secondary | ICD-10-CM | POA: Diagnosis not present

## 2023-04-16 DIAGNOSIS — R059 Cough, unspecified: Secondary | ICD-10-CM | POA: Diagnosis not present

## 2023-04-16 DIAGNOSIS — S32512D Fracture of superior rim of left pubis, subsequent encounter for fracture with routine healing: Secondary | ICD-10-CM | POA: Diagnosis not present

## 2023-04-16 DIAGNOSIS — M810 Age-related osteoporosis without current pathological fracture: Secondary | ICD-10-CM | POA: Diagnosis not present

## 2023-04-16 DIAGNOSIS — R2681 Unsteadiness on feet: Secondary | ICD-10-CM | POA: Diagnosis not present

## 2023-04-16 DIAGNOSIS — E1122 Type 2 diabetes mellitus with diabetic chronic kidney disease: Secondary | ICD-10-CM | POA: Diagnosis not present

## 2023-04-16 DIAGNOSIS — R296 Repeated falls: Secondary | ICD-10-CM | POA: Diagnosis not present

## 2023-04-16 DIAGNOSIS — I10 Essential (primary) hypertension: Secondary | ICD-10-CM | POA: Diagnosis not present

## 2023-04-16 DIAGNOSIS — N183 Chronic kidney disease, stage 3 unspecified: Secondary | ICD-10-CM | POA: Diagnosis not present

## 2023-04-16 DIAGNOSIS — N1831 Chronic kidney disease, stage 3a: Secondary | ICD-10-CM | POA: Diagnosis not present

## 2023-04-16 DIAGNOSIS — M545 Low back pain, unspecified: Secondary | ICD-10-CM | POA: Diagnosis not present

## 2023-04-16 DIAGNOSIS — S329XXA Fracture of unspecified parts of lumbosacral spine and pelvis, initial encounter for closed fracture: Secondary | ICD-10-CM | POA: Diagnosis not present

## 2023-04-16 DIAGNOSIS — G4700 Insomnia, unspecified: Secondary | ICD-10-CM | POA: Diagnosis not present

## 2023-04-16 DIAGNOSIS — I129 Hypertensive chronic kidney disease with stage 1 through stage 4 chronic kidney disease, or unspecified chronic kidney disease: Secondary | ICD-10-CM | POA: Diagnosis not present

## 2023-04-16 DIAGNOSIS — E1140 Type 2 diabetes mellitus with diabetic neuropathy, unspecified: Secondary | ICD-10-CM | POA: Diagnosis not present

## 2023-04-16 DIAGNOSIS — I2510 Atherosclerotic heart disease of native coronary artery without angina pectoris: Secondary | ICD-10-CM | POA: Diagnosis not present

## 2023-04-16 DIAGNOSIS — J449 Chronic obstructive pulmonary disease, unspecified: Secondary | ICD-10-CM | POA: Diagnosis not present

## 2023-04-16 DIAGNOSIS — M1990 Unspecified osteoarthritis, unspecified site: Secondary | ICD-10-CM | POA: Diagnosis not present

## 2023-04-16 DIAGNOSIS — F32A Depression, unspecified: Secondary | ICD-10-CM | POA: Diagnosis not present

## 2023-04-16 DIAGNOSIS — E785 Hyperlipidemia, unspecified: Secondary | ICD-10-CM | POA: Diagnosis not present

## 2023-04-16 DIAGNOSIS — E440 Moderate protein-calorie malnutrition: Secondary | ICD-10-CM | POA: Diagnosis not present

## 2023-04-16 DIAGNOSIS — Z720 Tobacco use: Secondary | ICD-10-CM | POA: Diagnosis not present

## 2023-04-16 DIAGNOSIS — S32592D Other specified fracture of left pubis, subsequent encounter for fracture with routine healing: Secondary | ICD-10-CM | POA: Diagnosis not present

## 2023-04-16 DIAGNOSIS — E46 Unspecified protein-calorie malnutrition: Secondary | ICD-10-CM | POA: Diagnosis not present

## 2023-04-16 DIAGNOSIS — I252 Old myocardial infarction: Secondary | ICD-10-CM | POA: Diagnosis not present

## 2023-04-16 DIAGNOSIS — E119 Type 2 diabetes mellitus without complications: Secondary | ICD-10-CM | POA: Diagnosis not present

## 2023-04-16 DIAGNOSIS — M6281 Muscle weakness (generalized): Secondary | ICD-10-CM | POA: Diagnosis not present

## 2023-04-16 DIAGNOSIS — S3210XD Unspecified fracture of sacrum, subsequent encounter for fracture with routine healing: Secondary | ICD-10-CM | POA: Diagnosis not present

## 2023-04-16 DIAGNOSIS — M792 Neuralgia and neuritis, unspecified: Secondary | ICD-10-CM | POA: Diagnosis not present

## 2023-04-16 DIAGNOSIS — I679 Cerebrovascular disease, unspecified: Secondary | ICD-10-CM | POA: Diagnosis not present

## 2023-04-16 DIAGNOSIS — E1165 Type 2 diabetes mellitus with hyperglycemia: Secondary | ICD-10-CM | POA: Diagnosis not present

## 2023-04-20 DIAGNOSIS — I10 Essential (primary) hypertension: Secondary | ICD-10-CM | POA: Diagnosis not present

## 2023-04-20 DIAGNOSIS — E46 Unspecified protein-calorie malnutrition: Secondary | ICD-10-CM | POA: Diagnosis not present

## 2023-04-20 DIAGNOSIS — F32A Depression, unspecified: Secondary | ICD-10-CM | POA: Diagnosis not present

## 2023-04-20 DIAGNOSIS — S329XXA Fracture of unspecified parts of lumbosacral spine and pelvis, initial encounter for closed fracture: Secondary | ICD-10-CM | POA: Diagnosis not present

## 2023-04-20 DIAGNOSIS — R296 Repeated falls: Secondary | ICD-10-CM | POA: Diagnosis not present

## 2023-04-20 DIAGNOSIS — N1831 Chronic kidney disease, stage 3a: Secondary | ICD-10-CM | POA: Diagnosis not present

## 2023-04-21 DIAGNOSIS — R059 Cough, unspecified: Secondary | ICD-10-CM | POA: Diagnosis not present

## 2023-04-23 DIAGNOSIS — G4700 Insomnia, unspecified: Secondary | ICD-10-CM | POA: Diagnosis not present

## 2023-04-23 DIAGNOSIS — S32592D Other specified fracture of left pubis, subsequent encounter for fracture with routine healing: Secondary | ICD-10-CM | POA: Diagnosis not present

## 2023-04-23 DIAGNOSIS — E46 Unspecified protein-calorie malnutrition: Secondary | ICD-10-CM | POA: Diagnosis not present

## 2023-04-23 DIAGNOSIS — N1831 Chronic kidney disease, stage 3a: Secondary | ICD-10-CM | POA: Diagnosis not present

## 2023-04-23 DIAGNOSIS — E119 Type 2 diabetes mellitus without complications: Secondary | ICD-10-CM | POA: Diagnosis not present

## 2023-04-23 DIAGNOSIS — R296 Repeated falls: Secondary | ICD-10-CM | POA: Diagnosis not present

## 2023-04-23 DIAGNOSIS — S32512D Fracture of superior rim of left pubis, subsequent encounter for fracture with routine healing: Secondary | ICD-10-CM | POA: Diagnosis not present

## 2023-04-23 DIAGNOSIS — I10 Essential (primary) hypertension: Secondary | ICD-10-CM | POA: Diagnosis not present

## 2023-04-29 DIAGNOSIS — E785 Hyperlipidemia, unspecified: Secondary | ICD-10-CM | POA: Diagnosis not present

## 2023-04-29 DIAGNOSIS — G4700 Insomnia, unspecified: Secondary | ICD-10-CM | POA: Diagnosis not present

## 2023-04-29 DIAGNOSIS — E119 Type 2 diabetes mellitus without complications: Secondary | ICD-10-CM | POA: Diagnosis not present

## 2023-04-29 DIAGNOSIS — N1831 Chronic kidney disease, stage 3a: Secondary | ICD-10-CM | POA: Diagnosis not present

## 2023-04-29 DIAGNOSIS — I10 Essential (primary) hypertension: Secondary | ICD-10-CM | POA: Diagnosis not present

## 2023-04-29 DIAGNOSIS — F32A Depression, unspecified: Secondary | ICD-10-CM | POA: Diagnosis not present

## 2023-04-29 DIAGNOSIS — S329XXA Fracture of unspecified parts of lumbosacral spine and pelvis, initial encounter for closed fracture: Secondary | ICD-10-CM | POA: Diagnosis not present

## 2023-05-04 DIAGNOSIS — Z09 Encounter for follow-up examination after completed treatment for conditions other than malignant neoplasm: Secondary | ICD-10-CM | POA: Diagnosis not present

## 2023-05-04 DIAGNOSIS — G894 Chronic pain syndrome: Secondary | ICD-10-CM | POA: Diagnosis not present

## 2023-05-04 DIAGNOSIS — N390 Urinary tract infection, site not specified: Secondary | ICD-10-CM | POA: Diagnosis not present

## 2023-06-15 DIAGNOSIS — M6281 Muscle weakness (generalized): Secondary | ICD-10-CM | POA: Diagnosis not present

## 2023-06-15 DIAGNOSIS — S3210XD Unspecified fracture of sacrum, subsequent encounter for fracture with routine healing: Secondary | ICD-10-CM | POA: Diagnosis not present

## 2023-06-15 DIAGNOSIS — M1990 Unspecified osteoarthritis, unspecified site: Secondary | ICD-10-CM | POA: Diagnosis not present

## 2023-06-15 DIAGNOSIS — S32009D Unspecified fracture of unspecified lumbar vertebra, subsequent encounter for fracture with routine healing: Secondary | ICD-10-CM | POA: Diagnosis not present

## 2023-06-15 DIAGNOSIS — J449 Chronic obstructive pulmonary disease, unspecified: Secondary | ICD-10-CM | POA: Diagnosis not present

## 2023-06-15 DIAGNOSIS — G894 Chronic pain syndrome: Secondary | ICD-10-CM | POA: Diagnosis not present

## 2023-06-15 DIAGNOSIS — I2510 Atherosclerotic heart disease of native coronary artery without angina pectoris: Secondary | ICD-10-CM | POA: Diagnosis not present

## 2023-06-15 DIAGNOSIS — I129 Hypertensive chronic kidney disease with stage 1 through stage 4 chronic kidney disease, or unspecified chronic kidney disease: Secondary | ICD-10-CM | POA: Diagnosis not present

## 2023-06-15 DIAGNOSIS — Z6822 Body mass index (BMI) 22.0-22.9, adult: Secondary | ICD-10-CM | POA: Diagnosis not present

## 2023-06-15 DIAGNOSIS — N183 Chronic kidney disease, stage 3 unspecified: Secondary | ICD-10-CM | POA: Diagnosis not present

## 2023-06-19 DIAGNOSIS — E559 Vitamin D deficiency, unspecified: Secondary | ICD-10-CM | POA: Diagnosis not present

## 2023-06-19 DIAGNOSIS — E538 Deficiency of other specified B group vitamins: Secondary | ICD-10-CM

## 2023-06-19 DIAGNOSIS — E1122 Type 2 diabetes mellitus with diabetic chronic kidney disease: Secondary | ICD-10-CM | POA: Diagnosis not present

## 2023-06-19 DIAGNOSIS — R799 Abnormal finding of blood chemistry, unspecified: Secondary | ICD-10-CM

## 2023-06-19 DIAGNOSIS — E785 Hyperlipidemia, unspecified: Secondary | ICD-10-CM | POA: Diagnosis not present

## 2023-06-19 DIAGNOSIS — N1832 Chronic kidney disease, stage 3b: Secondary | ICD-10-CM | POA: Diagnosis not present

## 2023-06-24 DIAGNOSIS — R21 Rash and other nonspecific skin eruption: Secondary | ICD-10-CM | POA: Diagnosis not present

## 2023-06-24 DIAGNOSIS — G4710 Hypersomnia, unspecified: Secondary | ICD-10-CM | POA: Diagnosis not present

## 2023-06-24 DIAGNOSIS — L299 Pruritus, unspecified: Secondary | ICD-10-CM | POA: Diagnosis not present

## 2023-07-09 DIAGNOSIS — K429 Umbilical hernia without obstruction or gangrene: Secondary | ICD-10-CM | POA: Diagnosis not present

## 2023-07-09 DIAGNOSIS — Z8781 Personal history of (healed) traumatic fracture: Secondary | ICD-10-CM | POA: Diagnosis not present

## 2023-07-09 DIAGNOSIS — M6281 Muscle weakness (generalized): Secondary | ICD-10-CM | POA: Diagnosis not present

## 2023-07-09 DIAGNOSIS — M1990 Unspecified osteoarthritis, unspecified site: Secondary | ICD-10-CM | POA: Diagnosis not present

## 2023-07-09 DIAGNOSIS — G894 Chronic pain syndrome: Secondary | ICD-10-CM | POA: Diagnosis not present

## 2023-07-10 DIAGNOSIS — K562 Volvulus: Secondary | ICD-10-CM | POA: Diagnosis not present

## 2023-07-10 DIAGNOSIS — K429 Umbilical hernia without obstruction or gangrene: Secondary | ICD-10-CM | POA: Diagnosis not present

## 2023-07-13 DIAGNOSIS — K429 Umbilical hernia without obstruction or gangrene: Secondary | ICD-10-CM | POA: Diagnosis not present

## 2023-07-13 DIAGNOSIS — K56609 Unspecified intestinal obstruction, unspecified as to partial versus complete obstruction: Secondary | ICD-10-CM | POA: Diagnosis not present

## 2023-07-16 DIAGNOSIS — E1159 Type 2 diabetes mellitus with other circulatory complications: Secondary | ICD-10-CM | POA: Diagnosis not present

## 2023-07-16 DIAGNOSIS — M62562 Muscle wasting and atrophy, not elsewhere classified, left lower leg: Secondary | ICD-10-CM | POA: Diagnosis not present

## 2023-07-16 DIAGNOSIS — B351 Tinea unguium: Secondary | ICD-10-CM | POA: Diagnosis not present

## 2023-07-16 DIAGNOSIS — M62551 Muscle wasting and atrophy, not elsewhere classified, right thigh: Secondary | ICD-10-CM | POA: Diagnosis not present

## 2023-07-16 DIAGNOSIS — E1140 Type 2 diabetes mellitus with diabetic neuropathy, unspecified: Secondary | ICD-10-CM | POA: Diagnosis not present

## 2023-07-16 DIAGNOSIS — M62561 Muscle wasting and atrophy, not elsewhere classified, right lower leg: Secondary | ICD-10-CM | POA: Diagnosis not present

## 2023-07-16 DIAGNOSIS — R2681 Unsteadiness on feet: Secondary | ICD-10-CM | POA: Diagnosis not present

## 2023-07-16 DIAGNOSIS — M62552 Muscle wasting and atrophy, not elsewhere classified, left thigh: Secondary | ICD-10-CM | POA: Diagnosis not present

## 2023-07-17 DIAGNOSIS — R2681 Unsteadiness on feet: Secondary | ICD-10-CM | POA: Diagnosis not present

## 2023-07-17 DIAGNOSIS — M62552 Muscle wasting and atrophy, not elsewhere classified, left thigh: Secondary | ICD-10-CM | POA: Diagnosis not present

## 2023-07-17 DIAGNOSIS — M62561 Muscle wasting and atrophy, not elsewhere classified, right lower leg: Secondary | ICD-10-CM | POA: Diagnosis not present

## 2023-07-17 DIAGNOSIS — M62562 Muscle wasting and atrophy, not elsewhere classified, left lower leg: Secondary | ICD-10-CM | POA: Diagnosis not present

## 2023-07-17 DIAGNOSIS — M62551 Muscle wasting and atrophy, not elsewhere classified, right thigh: Secondary | ICD-10-CM | POA: Diagnosis not present

## 2023-07-20 DIAGNOSIS — R2681 Unsteadiness on feet: Secondary | ICD-10-CM | POA: Diagnosis not present

## 2023-07-20 DIAGNOSIS — M62561 Muscle wasting and atrophy, not elsewhere classified, right lower leg: Secondary | ICD-10-CM | POA: Diagnosis not present

## 2023-07-20 DIAGNOSIS — M62551 Muscle wasting and atrophy, not elsewhere classified, right thigh: Secondary | ICD-10-CM | POA: Diagnosis not present

## 2023-07-20 DIAGNOSIS — M62552 Muscle wasting and atrophy, not elsewhere classified, left thigh: Secondary | ICD-10-CM | POA: Diagnosis not present

## 2023-07-20 DIAGNOSIS — M62562 Muscle wasting and atrophy, not elsewhere classified, left lower leg: Secondary | ICD-10-CM | POA: Diagnosis not present

## 2023-07-21 DIAGNOSIS — M62561 Muscle wasting and atrophy, not elsewhere classified, right lower leg: Secondary | ICD-10-CM | POA: Diagnosis not present

## 2023-07-21 DIAGNOSIS — M62552 Muscle wasting and atrophy, not elsewhere classified, left thigh: Secondary | ICD-10-CM | POA: Diagnosis not present

## 2023-07-21 DIAGNOSIS — M62562 Muscle wasting and atrophy, not elsewhere classified, left lower leg: Secondary | ICD-10-CM | POA: Diagnosis not present

## 2023-07-21 DIAGNOSIS — R2681 Unsteadiness on feet: Secondary | ICD-10-CM | POA: Diagnosis not present

## 2023-07-21 DIAGNOSIS — M62551 Muscle wasting and atrophy, not elsewhere classified, right thigh: Secondary | ICD-10-CM | POA: Diagnosis not present

## 2023-07-22 DIAGNOSIS — M62551 Muscle wasting and atrophy, not elsewhere classified, right thigh: Secondary | ICD-10-CM | POA: Diagnosis not present

## 2023-07-22 DIAGNOSIS — R635 Abnormal weight gain: Secondary | ICD-10-CM | POA: Diagnosis not present

## 2023-07-22 DIAGNOSIS — R2681 Unsteadiness on feet: Secondary | ICD-10-CM | POA: Diagnosis not present

## 2023-07-22 DIAGNOSIS — M62552 Muscle wasting and atrophy, not elsewhere classified, left thigh: Secondary | ICD-10-CM | POA: Diagnosis not present

## 2023-07-22 DIAGNOSIS — M62562 Muscle wasting and atrophy, not elsewhere classified, left lower leg: Secondary | ICD-10-CM | POA: Diagnosis not present

## 2023-07-22 DIAGNOSIS — M62561 Muscle wasting and atrophy, not elsewhere classified, right lower leg: Secondary | ICD-10-CM | POA: Diagnosis not present

## 2023-07-23 DIAGNOSIS — M62552 Muscle wasting and atrophy, not elsewhere classified, left thigh: Secondary | ICD-10-CM | POA: Diagnosis not present

## 2023-07-23 DIAGNOSIS — K429 Umbilical hernia without obstruction or gangrene: Secondary | ICD-10-CM | POA: Diagnosis not present

## 2023-07-23 DIAGNOSIS — M62562 Muscle wasting and atrophy, not elsewhere classified, left lower leg: Secondary | ICD-10-CM | POA: Diagnosis not present

## 2023-07-23 DIAGNOSIS — M62551 Muscle wasting and atrophy, not elsewhere classified, right thigh: Secondary | ICD-10-CM | POA: Diagnosis not present

## 2023-07-23 DIAGNOSIS — M62561 Muscle wasting and atrophy, not elsewhere classified, right lower leg: Secondary | ICD-10-CM | POA: Diagnosis not present

## 2023-07-23 DIAGNOSIS — R2681 Unsteadiness on feet: Secondary | ICD-10-CM | POA: Diagnosis not present

## 2023-07-24 DIAGNOSIS — R2689 Other abnormalities of gait and mobility: Secondary | ICD-10-CM | POA: Diagnosis not present

## 2023-07-24 DIAGNOSIS — W06XXXA Fall from bed, initial encounter: Secondary | ICD-10-CM | POA: Diagnosis not present

## 2023-07-24 DIAGNOSIS — S61411A Laceration without foreign body of right hand, initial encounter: Secondary | ICD-10-CM | POA: Diagnosis not present

## 2023-07-24 DIAGNOSIS — M6281 Muscle weakness (generalized): Secondary | ICD-10-CM | POA: Diagnosis not present

## 2023-07-24 DIAGNOSIS — B009 Herpesviral infection, unspecified: Secondary | ICD-10-CM | POA: Diagnosis not present

## 2023-07-24 DIAGNOSIS — S0081XA Abrasion of other part of head, initial encounter: Secondary | ICD-10-CM | POA: Diagnosis not present

## 2023-07-24 DIAGNOSIS — S0083XA Contusion of other part of head, initial encounter: Secondary | ICD-10-CM | POA: Diagnosis not present

## 2023-07-24 DIAGNOSIS — N390 Urinary tract infection, site not specified: Secondary | ICD-10-CM | POA: Diagnosis not present

## 2023-07-24 DIAGNOSIS — Z9181 History of falling: Secondary | ICD-10-CM | POA: Diagnosis not present

## 2023-07-24 DIAGNOSIS — S40011A Contusion of right shoulder, initial encounter: Secondary | ICD-10-CM | POA: Diagnosis not present

## 2023-07-28 DIAGNOSIS — M62552 Muscle wasting and atrophy, not elsewhere classified, left thigh: Secondary | ICD-10-CM | POA: Diagnosis not present

## 2023-07-28 DIAGNOSIS — M62562 Muscle wasting and atrophy, not elsewhere classified, left lower leg: Secondary | ICD-10-CM | POA: Diagnosis not present

## 2023-07-28 DIAGNOSIS — M62561 Muscle wasting and atrophy, not elsewhere classified, right lower leg: Secondary | ICD-10-CM | POA: Diagnosis not present

## 2023-07-28 DIAGNOSIS — M62551 Muscle wasting and atrophy, not elsewhere classified, right thigh: Secondary | ICD-10-CM | POA: Diagnosis not present

## 2023-07-28 DIAGNOSIS — R2681 Unsteadiness on feet: Secondary | ICD-10-CM | POA: Diagnosis not present

## 2023-07-29 DIAGNOSIS — M62552 Muscle wasting and atrophy, not elsewhere classified, left thigh: Secondary | ICD-10-CM | POA: Diagnosis not present

## 2023-07-29 DIAGNOSIS — R2681 Unsteadiness on feet: Secondary | ICD-10-CM | POA: Diagnosis not present

## 2023-07-29 DIAGNOSIS — M62561 Muscle wasting and atrophy, not elsewhere classified, right lower leg: Secondary | ICD-10-CM | POA: Diagnosis not present

## 2023-07-29 DIAGNOSIS — M62562 Muscle wasting and atrophy, not elsewhere classified, left lower leg: Secondary | ICD-10-CM | POA: Diagnosis not present

## 2023-07-29 DIAGNOSIS — M62551 Muscle wasting and atrophy, not elsewhere classified, right thigh: Secondary | ICD-10-CM | POA: Diagnosis not present

## 2023-07-30 DIAGNOSIS — B9620 Unspecified Escherichia coli [E. coli] as the cause of diseases classified elsewhere: Secondary | ICD-10-CM | POA: Diagnosis not present

## 2023-07-30 DIAGNOSIS — K429 Umbilical hernia without obstruction or gangrene: Secondary | ICD-10-CM | POA: Diagnosis not present

## 2023-07-30 DIAGNOSIS — F17210 Nicotine dependence, cigarettes, uncomplicated: Secondary | ICD-10-CM | POA: Diagnosis not present

## 2023-07-30 DIAGNOSIS — B964 Proteus (mirabilis) (morganii) as the cause of diseases classified elsewhere: Secondary | ICD-10-CM | POA: Diagnosis not present

## 2023-07-30 DIAGNOSIS — N183 Chronic kidney disease, stage 3 unspecified: Secondary | ICD-10-CM | POA: Diagnosis not present

## 2023-07-30 DIAGNOSIS — Z955 Presence of coronary angioplasty implant and graft: Secondary | ICD-10-CM | POA: Diagnosis not present

## 2023-07-30 DIAGNOSIS — E1141 Type 2 diabetes mellitus with diabetic mononeuropathy: Secondary | ICD-10-CM | POA: Diagnosis not present

## 2023-07-30 DIAGNOSIS — Z79899 Other long term (current) drug therapy: Secondary | ICD-10-CM | POA: Diagnosis not present

## 2023-07-30 DIAGNOSIS — I2510 Atherosclerotic heart disease of native coronary artery without angina pectoris: Secondary | ICD-10-CM | POA: Diagnosis not present

## 2023-07-30 DIAGNOSIS — R300 Dysuria: Secondary | ICD-10-CM | POA: Diagnosis not present

## 2023-07-30 DIAGNOSIS — R1310 Dysphagia, unspecified: Secondary | ICD-10-CM | POA: Diagnosis not present

## 2023-07-30 DIAGNOSIS — Z7984 Long term (current) use of oral hypoglycemic drugs: Secondary | ICD-10-CM | POA: Diagnosis not present

## 2023-07-30 DIAGNOSIS — E1122 Type 2 diabetes mellitus with diabetic chronic kidney disease: Secondary | ICD-10-CM | POA: Diagnosis not present

## 2023-07-30 DIAGNOSIS — M810 Age-related osteoporosis without current pathological fracture: Secondary | ICD-10-CM | POA: Diagnosis not present

## 2023-07-30 DIAGNOSIS — N390 Urinary tract infection, site not specified: Secondary | ICD-10-CM | POA: Diagnosis not present

## 2023-07-30 DIAGNOSIS — I10 Essential (primary) hypertension: Secondary | ICD-10-CM | POA: Diagnosis not present

## 2023-07-30 DIAGNOSIS — Z87440 Personal history of urinary (tract) infections: Secondary | ICD-10-CM | POA: Diagnosis not present

## 2023-07-30 DIAGNOSIS — K219 Gastro-esophageal reflux disease without esophagitis: Secondary | ICD-10-CM | POA: Diagnosis not present

## 2023-07-30 DIAGNOSIS — I129 Hypertensive chronic kidney disease with stage 1 through stage 4 chronic kidney disease, or unspecified chronic kidney disease: Secondary | ICD-10-CM | POA: Diagnosis not present

## 2023-07-30 DIAGNOSIS — G894 Chronic pain syndrome: Secondary | ICD-10-CM | POA: Diagnosis not present

## 2023-07-30 DIAGNOSIS — E785 Hyperlipidemia, unspecified: Secondary | ICD-10-CM | POA: Diagnosis not present

## 2023-07-30 DIAGNOSIS — M1990 Unspecified osteoarthritis, unspecified site: Secondary | ICD-10-CM | POA: Diagnosis not present

## 2023-08-13 DIAGNOSIS — E559 Vitamin D deficiency, unspecified: Secondary | ICD-10-CM | POA: Diagnosis not present

## 2023-08-13 DIAGNOSIS — D519 Vitamin B12 deficiency anemia, unspecified: Secondary | ICD-10-CM | POA: Diagnosis not present

## 2023-08-17 DIAGNOSIS — J309 Allergic rhinitis, unspecified: Secondary | ICD-10-CM | POA: Diagnosis not present

## 2023-08-17 DIAGNOSIS — Z8776 Personal history of (corrected) congenital diaphragmatic hernia or other congenital diaphragm malformations: Secondary | ICD-10-CM | POA: Diagnosis not present

## 2023-08-17 DIAGNOSIS — J029 Acute pharyngitis, unspecified: Secondary | ICD-10-CM | POA: Diagnosis not present

## 2023-09-09 DIAGNOSIS — M6281 Muscle weakness (generalized): Secondary | ICD-10-CM | POA: Diagnosis not present

## 2023-09-09 DIAGNOSIS — Z9181 History of falling: Secondary | ICD-10-CM | POA: Diagnosis not present

## 2023-09-09 DIAGNOSIS — S61412A Laceration without foreign body of left hand, initial encounter: Secondary | ICD-10-CM | POA: Diagnosis not present

## 2023-09-09 DIAGNOSIS — S60222A Contusion of left hand, initial encounter: Secondary | ICD-10-CM | POA: Diagnosis not present

## 2023-09-16 DIAGNOSIS — H9203 Otalgia, bilateral: Secondary | ICD-10-CM | POA: Diagnosis not present

## 2023-09-16 DIAGNOSIS — J309 Allergic rhinitis, unspecified: Secondary | ICD-10-CM | POA: Diagnosis not present

## 2023-09-16 DIAGNOSIS — R059 Cough, unspecified: Secondary | ICD-10-CM | POA: Diagnosis not present

## 2023-09-16 DIAGNOSIS — J040 Acute laryngitis: Secondary | ICD-10-CM | POA: Diagnosis not present

## 2023-09-16 DIAGNOSIS — H6121 Impacted cerumen, right ear: Secondary | ICD-10-CM | POA: Diagnosis not present

## 2023-09-18 DIAGNOSIS — E785 Hyperlipidemia, unspecified: Secondary | ICD-10-CM | POA: Diagnosis not present

## 2023-09-23 DIAGNOSIS — R635 Abnormal weight gain: Secondary | ICD-10-CM | POA: Diagnosis not present

## 2023-09-23 DIAGNOSIS — L299 Pruritus, unspecified: Secondary | ICD-10-CM | POA: Diagnosis not present

## 2023-09-23 DIAGNOSIS — G894 Chronic pain syndrome: Secondary | ICD-10-CM | POA: Diagnosis not present

## 2023-09-23 DIAGNOSIS — F330 Major depressive disorder, recurrent, mild: Secondary | ICD-10-CM

## 2023-09-23 DIAGNOSIS — R5381 Other malaise: Secondary | ICD-10-CM | POA: Diagnosis not present

## 2023-09-25 DIAGNOSIS — M47816 Spondylosis without myelopathy or radiculopathy, lumbar region: Secondary | ICD-10-CM | POA: Diagnosis not present

## 2023-09-25 DIAGNOSIS — M1990 Unspecified osteoarthritis, unspecified site: Secondary | ICD-10-CM | POA: Diagnosis not present

## 2023-09-25 DIAGNOSIS — G894 Chronic pain syndrome: Secondary | ICD-10-CM | POA: Diagnosis not present

## 2023-09-25 DIAGNOSIS — M545 Low back pain, unspecified: Secondary | ICD-10-CM | POA: Diagnosis not present

## 2023-09-25 DIAGNOSIS — N899 Noninflammatory disorder of vagina, unspecified: Secondary | ICD-10-CM

## 2023-09-25 DIAGNOSIS — M6281 Muscle weakness (generalized): Secondary | ICD-10-CM | POA: Diagnosis not present

## 2023-09-28 DIAGNOSIS — N390 Urinary tract infection, site not specified: Secondary | ICD-10-CM | POA: Diagnosis not present

## 2023-10-01 DIAGNOSIS — R0781 Pleurodynia: Secondary | ICD-10-CM | POA: Diagnosis not present

## 2023-10-01 DIAGNOSIS — R1012 Left upper quadrant pain: Secondary | ICD-10-CM | POA: Diagnosis not present

## 2023-10-01 DIAGNOSIS — R6889 Other general symptoms and signs: Secondary | ICD-10-CM | POA: Diagnosis not present

## 2023-10-01 DIAGNOSIS — Z79899 Other long term (current) drug therapy: Secondary | ICD-10-CM | POA: Diagnosis not present

## 2023-10-01 DIAGNOSIS — M549 Dorsalgia, unspecified: Secondary | ICD-10-CM | POA: Diagnosis not present

## 2023-10-01 DIAGNOSIS — Z743 Need for continuous supervision: Secondary | ICD-10-CM | POA: Diagnosis not present

## 2023-10-01 DIAGNOSIS — I10 Essential (primary) hypertension: Secondary | ICD-10-CM | POA: Diagnosis not present

## 2023-10-01 DIAGNOSIS — G894 Chronic pain syndrome: Secondary | ICD-10-CM | POA: Diagnosis not present

## 2023-10-01 DIAGNOSIS — R1084 Generalized abdominal pain: Secondary | ICD-10-CM | POA: Diagnosis not present

## 2023-10-05 DIAGNOSIS — N183 Chronic kidney disease, stage 3 unspecified: Secondary | ICD-10-CM | POA: Diagnosis not present

## 2023-10-05 DIAGNOSIS — F0390 Unspecified dementia without behavioral disturbance: Secondary | ICD-10-CM

## 2023-10-05 DIAGNOSIS — M6281 Muscle weakness (generalized): Secondary | ICD-10-CM | POA: Diagnosis not present

## 2023-10-05 DIAGNOSIS — J449 Chronic obstructive pulmonary disease, unspecified: Secondary | ICD-10-CM | POA: Diagnosis not present

## 2023-10-05 DIAGNOSIS — I129 Hypertensive chronic kidney disease with stage 1 through stage 4 chronic kidney disease, or unspecified chronic kidney disease: Secondary | ICD-10-CM | POA: Diagnosis not present

## 2023-10-05 DIAGNOSIS — R1012 Left upper quadrant pain: Secondary | ICD-10-CM | POA: Diagnosis not present

## 2023-10-05 DIAGNOSIS — G894 Chronic pain syndrome: Secondary | ICD-10-CM | POA: Diagnosis not present

## 2023-10-07 DIAGNOSIS — Z7984 Long term (current) use of oral hypoglycemic drugs: Secondary | ICD-10-CM | POA: Diagnosis not present

## 2023-10-07 DIAGNOSIS — I129 Hypertensive chronic kidney disease with stage 1 through stage 4 chronic kidney disease, or unspecified chronic kidney disease: Secondary | ICD-10-CM | POA: Diagnosis not present

## 2023-10-07 DIAGNOSIS — F17210 Nicotine dependence, cigarettes, uncomplicated: Secondary | ICD-10-CM | POA: Diagnosis not present

## 2023-10-07 DIAGNOSIS — G4700 Insomnia, unspecified: Secondary | ICD-10-CM | POA: Diagnosis not present

## 2023-10-07 DIAGNOSIS — G894 Chronic pain syndrome: Secondary | ICD-10-CM | POA: Diagnosis not present

## 2023-10-07 DIAGNOSIS — I2510 Atherosclerotic heart disease of native coronary artery without angina pectoris: Secondary | ICD-10-CM | POA: Diagnosis not present

## 2023-10-07 DIAGNOSIS — M62838 Other muscle spasm: Secondary | ICD-10-CM | POA: Diagnosis not present

## 2023-10-07 DIAGNOSIS — E1122 Type 2 diabetes mellitus with diabetic chronic kidney disease: Secondary | ICD-10-CM | POA: Diagnosis not present

## 2023-10-07 DIAGNOSIS — M545 Low back pain, unspecified: Secondary | ICD-10-CM | POA: Diagnosis not present

## 2023-10-07 DIAGNOSIS — N183 Chronic kidney disease, stage 3 unspecified: Secondary | ICD-10-CM | POA: Diagnosis not present

## 2023-10-07 DIAGNOSIS — Z8781 Personal history of (healed) traumatic fracture: Secondary | ICD-10-CM | POA: Diagnosis not present

## 2023-10-07 DIAGNOSIS — E7800 Pure hypercholesterolemia, unspecified: Secondary | ICD-10-CM | POA: Diagnosis not present

## 2023-10-07 DIAGNOSIS — Z79899 Other long term (current) drug therapy: Secondary | ICD-10-CM | POA: Diagnosis not present

## 2023-10-07 DIAGNOSIS — E1141 Type 2 diabetes mellitus with diabetic mononeuropathy: Secondary | ICD-10-CM | POA: Diagnosis not present

## 2023-10-07 DIAGNOSIS — K429 Umbilical hernia without obstruction or gangrene: Secondary | ICD-10-CM | POA: Diagnosis not present

## 2023-10-07 DIAGNOSIS — M810 Age-related osteoporosis without current pathological fracture: Secondary | ICD-10-CM | POA: Diagnosis not present

## 2023-10-07 DIAGNOSIS — J449 Chronic obstructive pulmonary disease, unspecified: Secondary | ICD-10-CM | POA: Diagnosis not present

## 2023-10-07 DIAGNOSIS — M1990 Unspecified osteoarthritis, unspecified site: Secondary | ICD-10-CM | POA: Diagnosis not present

## 2023-10-09 DIAGNOSIS — G4700 Insomnia, unspecified: Secondary | ICD-10-CM | POA: Diagnosis not present

## 2023-10-09 DIAGNOSIS — E7800 Pure hypercholesterolemia, unspecified: Secondary | ICD-10-CM | POA: Diagnosis not present

## 2023-10-09 DIAGNOSIS — E1141 Type 2 diabetes mellitus with diabetic mononeuropathy: Secondary | ICD-10-CM | POA: Diagnosis not present

## 2023-10-09 DIAGNOSIS — I129 Hypertensive chronic kidney disease with stage 1 through stage 4 chronic kidney disease, or unspecified chronic kidney disease: Secondary | ICD-10-CM | POA: Diagnosis not present

## 2023-10-09 DIAGNOSIS — M62838 Other muscle spasm: Secondary | ICD-10-CM | POA: Diagnosis not present

## 2023-10-09 DIAGNOSIS — F17210 Nicotine dependence, cigarettes, uncomplicated: Secondary | ICD-10-CM | POA: Diagnosis not present

## 2023-10-09 DIAGNOSIS — G894 Chronic pain syndrome: Secondary | ICD-10-CM | POA: Diagnosis not present

## 2023-10-09 DIAGNOSIS — Z7984 Long term (current) use of oral hypoglycemic drugs: Secondary | ICD-10-CM | POA: Diagnosis not present

## 2023-10-09 DIAGNOSIS — Z8781 Personal history of (healed) traumatic fracture: Secondary | ICD-10-CM | POA: Diagnosis not present

## 2023-10-09 DIAGNOSIS — M1990 Unspecified osteoarthritis, unspecified site: Secondary | ICD-10-CM | POA: Diagnosis not present

## 2023-10-09 DIAGNOSIS — Z79899 Other long term (current) drug therapy: Secondary | ICD-10-CM | POA: Diagnosis not present

## 2023-10-09 DIAGNOSIS — E1122 Type 2 diabetes mellitus with diabetic chronic kidney disease: Secondary | ICD-10-CM | POA: Diagnosis not present

## 2023-10-09 DIAGNOSIS — N183 Chronic kidney disease, stage 3 unspecified: Secondary | ICD-10-CM | POA: Diagnosis not present

## 2023-10-09 DIAGNOSIS — M545 Low back pain, unspecified: Secondary | ICD-10-CM | POA: Diagnosis not present

## 2023-10-09 DIAGNOSIS — I2510 Atherosclerotic heart disease of native coronary artery without angina pectoris: Secondary | ICD-10-CM | POA: Diagnosis not present

## 2023-10-09 DIAGNOSIS — J449 Chronic obstructive pulmonary disease, unspecified: Secondary | ICD-10-CM | POA: Diagnosis not present

## 2023-10-09 DIAGNOSIS — M810 Age-related osteoporosis without current pathological fracture: Secondary | ICD-10-CM | POA: Diagnosis not present

## 2023-10-09 DIAGNOSIS — K429 Umbilical hernia without obstruction or gangrene: Secondary | ICD-10-CM | POA: Diagnosis not present

## 2023-10-12 DIAGNOSIS — E1122 Type 2 diabetes mellitus with diabetic chronic kidney disease: Secondary | ICD-10-CM | POA: Diagnosis not present

## 2023-10-12 DIAGNOSIS — M810 Age-related osteoporosis without current pathological fracture: Secondary | ICD-10-CM | POA: Diagnosis not present

## 2023-10-12 DIAGNOSIS — M62838 Other muscle spasm: Secondary | ICD-10-CM | POA: Diagnosis not present

## 2023-10-12 DIAGNOSIS — E7800 Pure hypercholesterolemia, unspecified: Secondary | ICD-10-CM | POA: Diagnosis not present

## 2023-10-12 DIAGNOSIS — I129 Hypertensive chronic kidney disease with stage 1 through stage 4 chronic kidney disease, or unspecified chronic kidney disease: Secondary | ICD-10-CM | POA: Diagnosis not present

## 2023-10-12 DIAGNOSIS — Z79899 Other long term (current) drug therapy: Secondary | ICD-10-CM | POA: Diagnosis not present

## 2023-10-12 DIAGNOSIS — M6281 Muscle weakness (generalized): Secondary | ICD-10-CM | POA: Diagnosis not present

## 2023-10-12 DIAGNOSIS — M1990 Unspecified osteoarthritis, unspecified site: Secondary | ICD-10-CM | POA: Diagnosis not present

## 2023-10-12 DIAGNOSIS — F17210 Nicotine dependence, cigarettes, uncomplicated: Secondary | ICD-10-CM | POA: Diagnosis not present

## 2023-10-12 DIAGNOSIS — M545 Low back pain, unspecified: Secondary | ICD-10-CM | POA: Diagnosis not present

## 2023-10-12 DIAGNOSIS — G894 Chronic pain syndrome: Secondary | ICD-10-CM | POA: Diagnosis not present

## 2023-10-12 DIAGNOSIS — G4700 Insomnia, unspecified: Secondary | ICD-10-CM | POA: Diagnosis not present

## 2023-10-12 DIAGNOSIS — F0390 Unspecified dementia without behavioral disturbance: Secondary | ICD-10-CM

## 2023-10-12 DIAGNOSIS — Z8781 Personal history of (healed) traumatic fracture: Secondary | ICD-10-CM | POA: Diagnosis not present

## 2023-10-12 DIAGNOSIS — Z7984 Long term (current) use of oral hypoglycemic drugs: Secondary | ICD-10-CM | POA: Diagnosis not present

## 2023-10-12 DIAGNOSIS — E1141 Type 2 diabetes mellitus with diabetic mononeuropathy: Secondary | ICD-10-CM | POA: Diagnosis not present

## 2023-10-12 DIAGNOSIS — K429 Umbilical hernia without obstruction or gangrene: Secondary | ICD-10-CM | POA: Diagnosis not present

## 2023-10-12 DIAGNOSIS — I2510 Atherosclerotic heart disease of native coronary artery without angina pectoris: Secondary | ICD-10-CM | POA: Diagnosis not present

## 2023-10-12 DIAGNOSIS — N183 Chronic kidney disease, stage 3 unspecified: Secondary | ICD-10-CM | POA: Diagnosis not present

## 2023-10-12 DIAGNOSIS — J449 Chronic obstructive pulmonary disease, unspecified: Secondary | ICD-10-CM | POA: Diagnosis not present

## 2023-10-15 DIAGNOSIS — W19XXXA Unspecified fall, initial encounter: Secondary | ICD-10-CM | POA: Diagnosis not present

## 2023-10-15 DIAGNOSIS — N183 Chronic kidney disease, stage 3 unspecified: Secondary | ICD-10-CM | POA: Diagnosis not present

## 2023-10-15 DIAGNOSIS — G894 Chronic pain syndrome: Secondary | ICD-10-CM | POA: Diagnosis not present

## 2023-10-15 DIAGNOSIS — M810 Age-related osteoporosis without current pathological fracture: Secondary | ICD-10-CM | POA: Diagnosis not present

## 2023-10-15 DIAGNOSIS — M545 Low back pain, unspecified: Secondary | ICD-10-CM | POA: Diagnosis not present

## 2023-10-15 DIAGNOSIS — E1122 Type 2 diabetes mellitus with diabetic chronic kidney disease: Secondary | ICD-10-CM | POA: Diagnosis not present

## 2023-10-15 DIAGNOSIS — G4700 Insomnia, unspecified: Secondary | ICD-10-CM | POA: Diagnosis not present

## 2023-10-15 DIAGNOSIS — Z79899 Other long term (current) drug therapy: Secondary | ICD-10-CM | POA: Diagnosis not present

## 2023-10-15 DIAGNOSIS — M62838 Other muscle spasm: Secondary | ICD-10-CM | POA: Diagnosis not present

## 2023-10-15 DIAGNOSIS — Z7984 Long term (current) use of oral hypoglycemic drugs: Secondary | ICD-10-CM | POA: Diagnosis not present

## 2023-10-15 DIAGNOSIS — F17210 Nicotine dependence, cigarettes, uncomplicated: Secondary | ICD-10-CM | POA: Diagnosis not present

## 2023-10-15 DIAGNOSIS — E1141 Type 2 diabetes mellitus with diabetic mononeuropathy: Secondary | ICD-10-CM | POA: Diagnosis not present

## 2023-10-15 DIAGNOSIS — R2689 Other abnormalities of gait and mobility: Secondary | ICD-10-CM | POA: Diagnosis not present

## 2023-10-15 DIAGNOSIS — E7800 Pure hypercholesterolemia, unspecified: Secondary | ICD-10-CM | POA: Diagnosis not present

## 2023-10-15 DIAGNOSIS — K429 Umbilical hernia without obstruction or gangrene: Secondary | ICD-10-CM | POA: Diagnosis not present

## 2023-10-15 DIAGNOSIS — M6281 Muscle weakness (generalized): Secondary | ICD-10-CM | POA: Diagnosis not present

## 2023-10-15 DIAGNOSIS — M1990 Unspecified osteoarthritis, unspecified site: Secondary | ICD-10-CM | POA: Diagnosis not present

## 2023-10-15 DIAGNOSIS — I2510 Atherosclerotic heart disease of native coronary artery without angina pectoris: Secondary | ICD-10-CM | POA: Diagnosis not present

## 2023-10-15 DIAGNOSIS — Z9181 History of falling: Secondary | ICD-10-CM | POA: Diagnosis not present

## 2023-10-15 DIAGNOSIS — I129 Hypertensive chronic kidney disease with stage 1 through stage 4 chronic kidney disease, or unspecified chronic kidney disease: Secondary | ICD-10-CM | POA: Diagnosis not present

## 2023-10-15 DIAGNOSIS — Z8781 Personal history of (healed) traumatic fracture: Secondary | ICD-10-CM | POA: Diagnosis not present

## 2023-10-15 DIAGNOSIS — J449 Chronic obstructive pulmonary disease, unspecified: Secondary | ICD-10-CM | POA: Diagnosis not present

## 2023-10-15 DIAGNOSIS — F0390 Unspecified dementia without behavioral disturbance: Secondary | ICD-10-CM | POA: Diagnosis not present

## 2023-10-19 DIAGNOSIS — Z9181 History of falling: Secondary | ICD-10-CM | POA: Diagnosis not present

## 2023-10-19 DIAGNOSIS — R1030 Lower abdominal pain, unspecified: Secondary | ICD-10-CM | POA: Diagnosis not present

## 2023-10-19 DIAGNOSIS — R350 Frequency of micturition: Secondary | ICD-10-CM | POA: Diagnosis not present

## 2023-10-19 DIAGNOSIS — M6281 Muscle weakness (generalized): Secondary | ICD-10-CM | POA: Diagnosis not present

## 2023-10-19 DIAGNOSIS — W19XXXA Unspecified fall, initial encounter: Secondary | ICD-10-CM | POA: Diagnosis not present

## 2023-10-19 DIAGNOSIS — R2689 Other abnormalities of gait and mobility: Secondary | ICD-10-CM | POA: Diagnosis not present

## 2023-10-20 DIAGNOSIS — M62838 Other muscle spasm: Secondary | ICD-10-CM | POA: Diagnosis not present

## 2023-10-20 DIAGNOSIS — K429 Umbilical hernia without obstruction or gangrene: Secondary | ICD-10-CM | POA: Diagnosis not present

## 2023-10-20 DIAGNOSIS — G4700 Insomnia, unspecified: Secondary | ICD-10-CM | POA: Diagnosis not present

## 2023-10-20 DIAGNOSIS — E1122 Type 2 diabetes mellitus with diabetic chronic kidney disease: Secondary | ICD-10-CM | POA: Diagnosis not present

## 2023-10-20 DIAGNOSIS — E1141 Type 2 diabetes mellitus with diabetic mononeuropathy: Secondary | ICD-10-CM | POA: Diagnosis not present

## 2023-10-20 DIAGNOSIS — M1990 Unspecified osteoarthritis, unspecified site: Secondary | ICD-10-CM | POA: Diagnosis not present

## 2023-10-20 DIAGNOSIS — Z8781 Personal history of (healed) traumatic fracture: Secondary | ICD-10-CM | POA: Diagnosis not present

## 2023-10-20 DIAGNOSIS — N183 Chronic kidney disease, stage 3 unspecified: Secondary | ICD-10-CM | POA: Diagnosis not present

## 2023-10-20 DIAGNOSIS — Z79899 Other long term (current) drug therapy: Secondary | ICD-10-CM | POA: Diagnosis not present

## 2023-10-20 DIAGNOSIS — I129 Hypertensive chronic kidney disease with stage 1 through stage 4 chronic kidney disease, or unspecified chronic kidney disease: Secondary | ICD-10-CM | POA: Diagnosis not present

## 2023-10-20 DIAGNOSIS — M810 Age-related osteoporosis without current pathological fracture: Secondary | ICD-10-CM | POA: Diagnosis not present

## 2023-10-20 DIAGNOSIS — I2510 Atherosclerotic heart disease of native coronary artery without angina pectoris: Secondary | ICD-10-CM | POA: Diagnosis not present

## 2023-10-20 DIAGNOSIS — M545 Low back pain, unspecified: Secondary | ICD-10-CM | POA: Diagnosis not present

## 2023-10-20 DIAGNOSIS — E7800 Pure hypercholesterolemia, unspecified: Secondary | ICD-10-CM | POA: Diagnosis not present

## 2023-10-20 DIAGNOSIS — G894 Chronic pain syndrome: Secondary | ICD-10-CM | POA: Diagnosis not present

## 2023-10-20 DIAGNOSIS — Z7984 Long term (current) use of oral hypoglycemic drugs: Secondary | ICD-10-CM | POA: Diagnosis not present

## 2023-10-20 DIAGNOSIS — N390 Urinary tract infection, site not specified: Secondary | ICD-10-CM | POA: Diagnosis not present

## 2023-10-20 DIAGNOSIS — F17210 Nicotine dependence, cigarettes, uncomplicated: Secondary | ICD-10-CM | POA: Diagnosis not present

## 2023-10-20 DIAGNOSIS — J449 Chronic obstructive pulmonary disease, unspecified: Secondary | ICD-10-CM | POA: Diagnosis not present

## 2023-10-21 DIAGNOSIS — Z79899 Other long term (current) drug therapy: Secondary | ICD-10-CM | POA: Diagnosis not present

## 2023-10-21 DIAGNOSIS — J449 Chronic obstructive pulmonary disease, unspecified: Secondary | ICD-10-CM | POA: Diagnosis not present

## 2023-10-21 DIAGNOSIS — M62838 Other muscle spasm: Secondary | ICD-10-CM | POA: Diagnosis not present

## 2023-10-21 DIAGNOSIS — Z8781 Personal history of (healed) traumatic fracture: Secondary | ICD-10-CM | POA: Diagnosis not present

## 2023-10-21 DIAGNOSIS — I129 Hypertensive chronic kidney disease with stage 1 through stage 4 chronic kidney disease, or unspecified chronic kidney disease: Secondary | ICD-10-CM | POA: Diagnosis not present

## 2023-10-21 DIAGNOSIS — F17210 Nicotine dependence, cigarettes, uncomplicated: Secondary | ICD-10-CM | POA: Diagnosis not present

## 2023-10-21 DIAGNOSIS — R5381 Other malaise: Secondary | ICD-10-CM | POA: Diagnosis not present

## 2023-10-21 DIAGNOSIS — F0390 Unspecified dementia without behavioral disturbance: Secondary | ICD-10-CM | POA: Diagnosis not present

## 2023-10-21 DIAGNOSIS — M545 Low back pain, unspecified: Secondary | ICD-10-CM | POA: Diagnosis not present

## 2023-10-21 DIAGNOSIS — R630 Anorexia: Secondary | ICD-10-CM | POA: Diagnosis not present

## 2023-10-21 DIAGNOSIS — E7800 Pure hypercholesterolemia, unspecified: Secondary | ICD-10-CM | POA: Diagnosis not present

## 2023-10-21 DIAGNOSIS — N183 Chronic kidney disease, stage 3 unspecified: Secondary | ICD-10-CM | POA: Diagnosis not present

## 2023-10-21 DIAGNOSIS — E1122 Type 2 diabetes mellitus with diabetic chronic kidney disease: Secondary | ICD-10-CM | POA: Diagnosis not present

## 2023-10-21 DIAGNOSIS — G894 Chronic pain syndrome: Secondary | ICD-10-CM | POA: Diagnosis not present

## 2023-10-21 DIAGNOSIS — M1990 Unspecified osteoarthritis, unspecified site: Secondary | ICD-10-CM | POA: Diagnosis not present

## 2023-10-21 DIAGNOSIS — K429 Umbilical hernia without obstruction or gangrene: Secondary | ICD-10-CM | POA: Diagnosis not present

## 2023-10-21 DIAGNOSIS — I2510 Atherosclerotic heart disease of native coronary artery without angina pectoris: Secondary | ICD-10-CM | POA: Diagnosis not present

## 2023-10-21 DIAGNOSIS — M810 Age-related osteoporosis without current pathological fracture: Secondary | ICD-10-CM | POA: Diagnosis not present

## 2023-10-21 DIAGNOSIS — E1141 Type 2 diabetes mellitus with diabetic mononeuropathy: Secondary | ICD-10-CM | POA: Diagnosis not present

## 2023-10-21 DIAGNOSIS — Z7984 Long term (current) use of oral hypoglycemic drugs: Secondary | ICD-10-CM | POA: Diagnosis not present

## 2023-10-21 DIAGNOSIS — G4700 Insomnia, unspecified: Secondary | ICD-10-CM | POA: Diagnosis not present

## 2023-10-22 DIAGNOSIS — D519 Vitamin B12 deficiency anemia, unspecified: Secondary | ICD-10-CM | POA: Diagnosis not present

## 2023-10-22 DIAGNOSIS — E1122 Type 2 diabetes mellitus with diabetic chronic kidney disease: Secondary | ICD-10-CM | POA: Diagnosis not present

## 2023-10-22 DIAGNOSIS — N183 Chronic kidney disease, stage 3 unspecified: Secondary | ICD-10-CM | POA: Diagnosis not present

## 2023-10-23 DIAGNOSIS — F0390 Unspecified dementia without behavioral disturbance: Secondary | ICD-10-CM | POA: Diagnosis not present

## 2023-10-23 DIAGNOSIS — R630 Anorexia: Secondary | ICD-10-CM | POA: Diagnosis not present

## 2023-10-23 DIAGNOSIS — R799 Abnormal finding of blood chemistry, unspecified: Secondary | ICD-10-CM | POA: Diagnosis not present

## 2023-10-23 DIAGNOSIS — N184 Chronic kidney disease, stage 4 (severe): Secondary | ICD-10-CM | POA: Diagnosis not present

## 2023-10-27 DIAGNOSIS — Z7984 Long term (current) use of oral hypoglycemic drugs: Secondary | ICD-10-CM | POA: Diagnosis not present

## 2023-10-27 DIAGNOSIS — M545 Low back pain, unspecified: Secondary | ICD-10-CM | POA: Diagnosis not present

## 2023-10-27 DIAGNOSIS — E1122 Type 2 diabetes mellitus with diabetic chronic kidney disease: Secondary | ICD-10-CM | POA: Diagnosis not present

## 2023-10-27 DIAGNOSIS — N183 Chronic kidney disease, stage 3 unspecified: Secondary | ICD-10-CM | POA: Diagnosis not present

## 2023-10-27 DIAGNOSIS — M62838 Other muscle spasm: Secondary | ICD-10-CM | POA: Diagnosis not present

## 2023-10-27 DIAGNOSIS — I2510 Atherosclerotic heart disease of native coronary artery without angina pectoris: Secondary | ICD-10-CM | POA: Diagnosis not present

## 2023-10-27 DIAGNOSIS — M1990 Unspecified osteoarthritis, unspecified site: Secondary | ICD-10-CM | POA: Diagnosis not present

## 2023-10-27 DIAGNOSIS — J449 Chronic obstructive pulmonary disease, unspecified: Secondary | ICD-10-CM | POA: Diagnosis not present

## 2023-10-27 DIAGNOSIS — E1141 Type 2 diabetes mellitus with diabetic mononeuropathy: Secondary | ICD-10-CM | POA: Diagnosis not present

## 2023-10-27 DIAGNOSIS — N390 Urinary tract infection, site not specified: Secondary | ICD-10-CM | POA: Diagnosis not present

## 2023-10-27 DIAGNOSIS — K429 Umbilical hernia without obstruction or gangrene: Secondary | ICD-10-CM | POA: Diagnosis not present

## 2023-10-27 DIAGNOSIS — E7800 Pure hypercholesterolemia, unspecified: Secondary | ICD-10-CM | POA: Diagnosis not present

## 2023-10-27 DIAGNOSIS — Z79899 Other long term (current) drug therapy: Secondary | ICD-10-CM | POA: Diagnosis not present

## 2023-10-27 DIAGNOSIS — Z8781 Personal history of (healed) traumatic fracture: Secondary | ICD-10-CM | POA: Diagnosis not present

## 2023-10-27 DIAGNOSIS — G4700 Insomnia, unspecified: Secondary | ICD-10-CM | POA: Diagnosis not present

## 2023-10-27 DIAGNOSIS — F17210 Nicotine dependence, cigarettes, uncomplicated: Secondary | ICD-10-CM | POA: Diagnosis not present

## 2023-10-27 DIAGNOSIS — I129 Hypertensive chronic kidney disease with stage 1 through stage 4 chronic kidney disease, or unspecified chronic kidney disease: Secondary | ICD-10-CM | POA: Diagnosis not present

## 2023-10-27 DIAGNOSIS — G894 Chronic pain syndrome: Secondary | ICD-10-CM | POA: Diagnosis not present

## 2023-10-27 DIAGNOSIS — M810 Age-related osteoporosis without current pathological fracture: Secondary | ICD-10-CM | POA: Diagnosis not present

## 2023-10-29 DIAGNOSIS — Z8781 Personal history of (healed) traumatic fracture: Secondary | ICD-10-CM | POA: Diagnosis not present

## 2023-10-29 DIAGNOSIS — E7800 Pure hypercholesterolemia, unspecified: Secondary | ICD-10-CM | POA: Diagnosis not present

## 2023-10-29 DIAGNOSIS — M810 Age-related osteoporosis without current pathological fracture: Secondary | ICD-10-CM | POA: Diagnosis not present

## 2023-10-29 DIAGNOSIS — Z79899 Other long term (current) drug therapy: Secondary | ICD-10-CM | POA: Diagnosis not present

## 2023-10-29 DIAGNOSIS — Z87440 Personal history of urinary (tract) infections: Secondary | ICD-10-CM | POA: Diagnosis not present

## 2023-10-29 DIAGNOSIS — G894 Chronic pain syndrome: Secondary | ICD-10-CM | POA: Diagnosis not present

## 2023-10-29 DIAGNOSIS — G4700 Insomnia, unspecified: Secondary | ICD-10-CM | POA: Diagnosis not present

## 2023-10-29 DIAGNOSIS — M62838 Other muscle spasm: Secondary | ICD-10-CM | POA: Diagnosis not present

## 2023-10-29 DIAGNOSIS — R5381 Other malaise: Secondary | ICD-10-CM | POA: Diagnosis not present

## 2023-10-29 DIAGNOSIS — E1141 Type 2 diabetes mellitus with diabetic mononeuropathy: Secondary | ICD-10-CM | POA: Diagnosis not present

## 2023-10-29 DIAGNOSIS — E1122 Type 2 diabetes mellitus with diabetic chronic kidney disease: Secondary | ICD-10-CM | POA: Diagnosis not present

## 2023-10-29 DIAGNOSIS — F17210 Nicotine dependence, cigarettes, uncomplicated: Secondary | ICD-10-CM | POA: Diagnosis not present

## 2023-10-29 DIAGNOSIS — F0390 Unspecified dementia without behavioral disturbance: Secondary | ICD-10-CM | POA: Diagnosis not present

## 2023-10-29 DIAGNOSIS — I129 Hypertensive chronic kidney disease with stage 1 through stage 4 chronic kidney disease, or unspecified chronic kidney disease: Secondary | ICD-10-CM | POA: Diagnosis not present

## 2023-10-29 DIAGNOSIS — M545 Low back pain, unspecified: Secondary | ICD-10-CM | POA: Diagnosis not present

## 2023-10-29 DIAGNOSIS — M1990 Unspecified osteoarthritis, unspecified site: Secondary | ICD-10-CM | POA: Diagnosis not present

## 2023-10-29 DIAGNOSIS — Z7984 Long term (current) use of oral hypoglycemic drugs: Secondary | ICD-10-CM | POA: Diagnosis not present

## 2023-10-29 DIAGNOSIS — K429 Umbilical hernia without obstruction or gangrene: Secondary | ICD-10-CM | POA: Diagnosis not present

## 2023-10-29 DIAGNOSIS — I2510 Atherosclerotic heart disease of native coronary artery without angina pectoris: Secondary | ICD-10-CM | POA: Diagnosis not present

## 2023-10-29 DIAGNOSIS — N183 Chronic kidney disease, stage 3 unspecified: Secondary | ICD-10-CM | POA: Diagnosis not present

## 2023-10-29 DIAGNOSIS — J449 Chronic obstructive pulmonary disease, unspecified: Secondary | ICD-10-CM | POA: Diagnosis not present

## 2023-10-29 DIAGNOSIS — R630 Anorexia: Secondary | ICD-10-CM | POA: Diagnosis not present

## 2023-11-02 DIAGNOSIS — Z87440 Personal history of urinary (tract) infections: Secondary | ICD-10-CM | POA: Diagnosis not present

## 2023-11-02 DIAGNOSIS — F03918 Unspecified dementia, unspecified severity, with other behavioral disturbance: Secondary | ICD-10-CM | POA: Diagnosis not present

## 2023-11-02 DIAGNOSIS — R451 Restlessness and agitation: Secondary | ICD-10-CM | POA: Diagnosis not present

## 2023-11-02 DIAGNOSIS — N390 Urinary tract infection, site not specified: Secondary | ICD-10-CM | POA: Diagnosis not present

## 2023-11-02 DIAGNOSIS — N184 Chronic kidney disease, stage 4 (severe): Secondary | ICD-10-CM | POA: Diagnosis not present

## 2023-11-06 DIAGNOSIS — K429 Umbilical hernia without obstruction or gangrene: Secondary | ICD-10-CM | POA: Diagnosis not present

## 2023-11-06 DIAGNOSIS — I2510 Atherosclerotic heart disease of native coronary artery without angina pectoris: Secondary | ICD-10-CM | POA: Diagnosis not present

## 2023-11-06 DIAGNOSIS — Z79899 Other long term (current) drug therapy: Secondary | ICD-10-CM | POA: Diagnosis not present

## 2023-11-06 DIAGNOSIS — G4700 Insomnia, unspecified: Secondary | ICD-10-CM | POA: Diagnosis not present

## 2023-11-06 DIAGNOSIS — M545 Low back pain, unspecified: Secondary | ICD-10-CM | POA: Diagnosis not present

## 2023-11-06 DIAGNOSIS — E7800 Pure hypercholesterolemia, unspecified: Secondary | ICD-10-CM | POA: Diagnosis not present

## 2023-11-06 DIAGNOSIS — M1990 Unspecified osteoarthritis, unspecified site: Secondary | ICD-10-CM | POA: Diagnosis not present

## 2023-11-06 DIAGNOSIS — E1122 Type 2 diabetes mellitus with diabetic chronic kidney disease: Secondary | ICD-10-CM | POA: Diagnosis not present

## 2023-11-06 DIAGNOSIS — Z8781 Personal history of (healed) traumatic fracture: Secondary | ICD-10-CM | POA: Diagnosis not present

## 2023-11-06 DIAGNOSIS — I129 Hypertensive chronic kidney disease with stage 1 through stage 4 chronic kidney disease, or unspecified chronic kidney disease: Secondary | ICD-10-CM | POA: Diagnosis not present

## 2023-11-06 DIAGNOSIS — J449 Chronic obstructive pulmonary disease, unspecified: Secondary | ICD-10-CM | POA: Diagnosis not present

## 2023-11-06 DIAGNOSIS — M62838 Other muscle spasm: Secondary | ICD-10-CM | POA: Diagnosis not present

## 2023-11-06 DIAGNOSIS — F17210 Nicotine dependence, cigarettes, uncomplicated: Secondary | ICD-10-CM | POA: Diagnosis not present

## 2023-11-06 DIAGNOSIS — E1141 Type 2 diabetes mellitus with diabetic mononeuropathy: Secondary | ICD-10-CM | POA: Diagnosis not present

## 2023-11-06 DIAGNOSIS — G894 Chronic pain syndrome: Secondary | ICD-10-CM | POA: Diagnosis not present

## 2023-11-06 DIAGNOSIS — N183 Chronic kidney disease, stage 3 unspecified: Secondary | ICD-10-CM | POA: Diagnosis not present

## 2023-11-06 DIAGNOSIS — Z7984 Long term (current) use of oral hypoglycemic drugs: Secondary | ICD-10-CM | POA: Diagnosis not present

## 2023-11-06 DIAGNOSIS — M810 Age-related osteoporosis without current pathological fracture: Secondary | ICD-10-CM | POA: Diagnosis not present

## 2023-11-18 DIAGNOSIS — F419 Anxiety disorder, unspecified: Secondary | ICD-10-CM | POA: Diagnosis not present

## 2023-11-18 DIAGNOSIS — F0390 Unspecified dementia without behavioral disturbance: Secondary | ICD-10-CM | POA: Diagnosis not present

## 2023-11-18 DIAGNOSIS — F330 Major depressive disorder, recurrent, mild: Secondary | ICD-10-CM | POA: Diagnosis not present

## 2023-11-18 DIAGNOSIS — N184 Chronic kidney disease, stage 4 (severe): Secondary | ICD-10-CM | POA: Diagnosis not present

## 2023-12-02 DIAGNOSIS — M6281 Muscle weakness (generalized): Secondary | ICD-10-CM | POA: Diagnosis not present

## 2023-12-02 DIAGNOSIS — M533 Sacrococcygeal disorders, not elsewhere classified: Secondary | ICD-10-CM | POA: Diagnosis not present

## 2023-12-02 DIAGNOSIS — M545 Low back pain, unspecified: Secondary | ICD-10-CM | POA: Diagnosis not present

## 2023-12-02 DIAGNOSIS — W19XXXA Unspecified fall, initial encounter: Secondary | ICD-10-CM | POA: Diagnosis not present

## 2023-12-02 DIAGNOSIS — R1030 Lower abdominal pain, unspecified: Secondary | ICD-10-CM | POA: Diagnosis not present

## 2023-12-02 DIAGNOSIS — R2689 Other abnormalities of gait and mobility: Secondary | ICD-10-CM | POA: Diagnosis not present

## 2023-12-03 DIAGNOSIS — N390 Urinary tract infection, site not specified: Secondary | ICD-10-CM | POA: Diagnosis not present

## 2023-12-09 DIAGNOSIS — N184 Chronic kidney disease, stage 4 (severe): Secondary | ICD-10-CM | POA: Diagnosis not present

## 2023-12-09 DIAGNOSIS — F03918 Unspecified dementia, unspecified severity, with other behavioral disturbance: Secondary | ICD-10-CM

## 2023-12-09 DIAGNOSIS — J449 Chronic obstructive pulmonary disease, unspecified: Secondary | ICD-10-CM | POA: Diagnosis not present

## 2023-12-09 DIAGNOSIS — I129 Hypertensive chronic kidney disease with stage 1 through stage 4 chronic kidney disease, or unspecified chronic kidney disease: Secondary | ICD-10-CM | POA: Diagnosis not present

## 2023-12-09 DIAGNOSIS — G894 Chronic pain syndrome: Secondary | ICD-10-CM | POA: Diagnosis not present

## 2023-12-09 DIAGNOSIS — E538 Deficiency of other specified B group vitamins: Secondary | ICD-10-CM | POA: Diagnosis not present

## 2023-12-09 DIAGNOSIS — E1122 Type 2 diabetes mellitus with diabetic chronic kidney disease: Secondary | ICD-10-CM | POA: Diagnosis not present

## 2023-12-09 DIAGNOSIS — I2510 Atherosclerotic heart disease of native coronary artery without angina pectoris: Secondary | ICD-10-CM | POA: Diagnosis not present

## 2023-12-09 DIAGNOSIS — M1990 Unspecified osteoarthritis, unspecified site: Secondary | ICD-10-CM | POA: Diagnosis not present

## 2023-12-09 DIAGNOSIS — E559 Vitamin D deficiency, unspecified: Secondary | ICD-10-CM | POA: Diagnosis not present

## 2023-12-10 DIAGNOSIS — E119 Type 2 diabetes mellitus without complications: Secondary | ICD-10-CM | POA: Diagnosis not present

## 2023-12-10 DIAGNOSIS — I129 Hypertensive chronic kidney disease with stage 1 through stage 4 chronic kidney disease, or unspecified chronic kidney disease: Secondary | ICD-10-CM | POA: Diagnosis not present

## 2023-12-10 DIAGNOSIS — Z79899 Other long term (current) drug therapy: Secondary | ICD-10-CM | POA: Diagnosis not present

## 2023-12-10 DIAGNOSIS — D649 Anemia, unspecified: Secondary | ICD-10-CM | POA: Diagnosis not present

## 2023-12-10 DIAGNOSIS — I10 Essential (primary) hypertension: Secondary | ICD-10-CM | POA: Diagnosis not present

## 2023-12-16 DIAGNOSIS — F03918 Unspecified dementia, unspecified severity, with other behavioral disturbance: Secondary | ICD-10-CM | POA: Diagnosis not present

## 2023-12-16 DIAGNOSIS — R519 Headache, unspecified: Secondary | ICD-10-CM | POA: Diagnosis not present

## 2023-12-16 DIAGNOSIS — R42 Dizziness and giddiness: Secondary | ICD-10-CM | POA: Diagnosis not present

## 2023-12-16 DIAGNOSIS — J019 Acute sinusitis, unspecified: Secondary | ICD-10-CM | POA: Diagnosis not present

## 2023-12-18 DIAGNOSIS — E1122 Type 2 diabetes mellitus with diabetic chronic kidney disease: Secondary | ICD-10-CM

## 2023-12-18 DIAGNOSIS — N184 Chronic kidney disease, stage 4 (severe): Secondary | ICD-10-CM

## 2023-12-18 DIAGNOSIS — F0390 Unspecified dementia without behavioral disturbance: Secondary | ICD-10-CM

## 2023-12-24 DIAGNOSIS — R410 Disorientation, unspecified: Secondary | ICD-10-CM | POA: Diagnosis not present

## 2023-12-24 DIAGNOSIS — N390 Urinary tract infection, site not specified: Secondary | ICD-10-CM | POA: Diagnosis not present

## 2023-12-24 DIAGNOSIS — G44209 Tension-type headache, unspecified, not intractable: Secondary | ICD-10-CM | POA: Diagnosis not present

## 2023-12-24 DIAGNOSIS — Z7984 Long term (current) use of oral hypoglycemic drugs: Secondary | ICD-10-CM | POA: Diagnosis not present

## 2023-12-24 DIAGNOSIS — R350 Frequency of micturition: Secondary | ICD-10-CM | POA: Diagnosis not present

## 2023-12-24 DIAGNOSIS — Z79899 Other long term (current) drug therapy: Secondary | ICD-10-CM | POA: Diagnosis not present

## 2023-12-24 DIAGNOSIS — R6889 Other general symptoms and signs: Secondary | ICD-10-CM | POA: Diagnosis not present

## 2023-12-24 DIAGNOSIS — M545 Low back pain, unspecified: Secondary | ICD-10-CM | POA: Diagnosis not present

## 2023-12-24 DIAGNOSIS — N189 Chronic kidney disease, unspecified: Secondary | ICD-10-CM | POA: Diagnosis not present

## 2023-12-24 DIAGNOSIS — R519 Headache, unspecified: Secondary | ICD-10-CM | POA: Diagnosis not present

## 2023-12-24 DIAGNOSIS — R109 Unspecified abdominal pain: Secondary | ICD-10-CM | POA: Diagnosis not present

## 2023-12-24 DIAGNOSIS — Z743 Need for continuous supervision: Secondary | ICD-10-CM | POA: Diagnosis not present

## 2023-12-24 DIAGNOSIS — R079 Chest pain, unspecified: Secondary | ICD-10-CM | POA: Diagnosis not present

## 2023-12-24 DIAGNOSIS — K5901 Slow transit constipation: Secondary | ICD-10-CM | POA: Diagnosis not present

## 2023-12-24 DIAGNOSIS — R531 Weakness: Secondary | ICD-10-CM | POA: Diagnosis not present

## 2023-12-24 DIAGNOSIS — R451 Restlessness and agitation: Secondary | ICD-10-CM | POA: Diagnosis not present

## 2023-12-24 DIAGNOSIS — N3000 Acute cystitis without hematuria: Secondary | ICD-10-CM | POA: Diagnosis not present

## 2023-12-25 DIAGNOSIS — G894 Chronic pain syndrome: Secondary | ICD-10-CM | POA: Diagnosis not present

## 2023-12-25 DIAGNOSIS — K5900 Constipation, unspecified: Secondary | ICD-10-CM | POA: Diagnosis not present

## 2023-12-25 DIAGNOSIS — F03918 Unspecified dementia, unspecified severity, with other behavioral disturbance: Secondary | ICD-10-CM | POA: Diagnosis not present

## 2023-12-25 DIAGNOSIS — Z87440 Personal history of urinary (tract) infections: Secondary | ICD-10-CM | POA: Diagnosis not present

## 2023-12-25 DIAGNOSIS — N390 Urinary tract infection, site not specified: Secondary | ICD-10-CM | POA: Diagnosis not present

## 2023-12-25 DIAGNOSIS — N184 Chronic kidney disease, stage 4 (severe): Secondary | ICD-10-CM | POA: Diagnosis not present

## 2023-12-29 DIAGNOSIS — B351 Tinea unguium: Secondary | ICD-10-CM | POA: Diagnosis not present

## 2023-12-29 DIAGNOSIS — E1159 Type 2 diabetes mellitus with other circulatory complications: Secondary | ICD-10-CM | POA: Diagnosis not present

## 2024-01-01 DIAGNOSIS — S61411A Laceration without foreign body of right hand, initial encounter: Secondary | ICD-10-CM | POA: Diagnosis not present

## 2024-01-01 DIAGNOSIS — Z9181 History of falling: Secondary | ICD-10-CM | POA: Diagnosis not present

## 2024-01-01 DIAGNOSIS — M6281 Muscle weakness (generalized): Secondary | ICD-10-CM | POA: Diagnosis not present

## 2024-01-01 DIAGNOSIS — F0390 Unspecified dementia without behavioral disturbance: Secondary | ICD-10-CM | POA: Diagnosis not present

## 2024-01-01 DIAGNOSIS — R2689 Other abnormalities of gait and mobility: Secondary | ICD-10-CM | POA: Diagnosis not present

## 2024-01-08 DIAGNOSIS — Z9181 History of falling: Secondary | ICD-10-CM | POA: Diagnosis not present

## 2024-01-08 DIAGNOSIS — F03918 Unspecified dementia, unspecified severity, with other behavioral disturbance: Secondary | ICD-10-CM | POA: Diagnosis not present

## 2024-01-08 DIAGNOSIS — R2689 Other abnormalities of gait and mobility: Secondary | ICD-10-CM | POA: Diagnosis not present

## 2024-01-08 DIAGNOSIS — M6281 Muscle weakness (generalized): Secondary | ICD-10-CM | POA: Diagnosis not present

## 2024-01-13 DIAGNOSIS — N390 Urinary tract infection, site not specified: Secondary | ICD-10-CM | POA: Diagnosis not present

## 2024-01-19 DIAGNOSIS — N390 Urinary tract infection, site not specified: Secondary | ICD-10-CM | POA: Diagnosis not present

## 2024-01-29 DIAGNOSIS — G894 Chronic pain syndrome: Secondary | ICD-10-CM | POA: Diagnosis not present

## 2024-01-29 DIAGNOSIS — M545 Low back pain, unspecified: Secondary | ICD-10-CM | POA: Diagnosis not present

## 2024-01-29 DIAGNOSIS — M546 Pain in thoracic spine: Secondary | ICD-10-CM | POA: Diagnosis not present

## 2024-01-29 DIAGNOSIS — M1990 Unspecified osteoarthritis, unspecified site: Secondary | ICD-10-CM | POA: Diagnosis not present

## 2024-01-29 DIAGNOSIS — M6281 Muscle weakness (generalized): Secondary | ICD-10-CM | POA: Diagnosis not present

## 2024-02-21 DIAGNOSIS — S32512A Fracture of superior rim of left pubis, initial encounter for closed fracture: Secondary | ICD-10-CM | POA: Diagnosis not present

## 2024-02-21 DIAGNOSIS — Z743 Need for continuous supervision: Secondary | ICD-10-CM | POA: Diagnosis not present

## 2024-02-21 DIAGNOSIS — W19XXXA Unspecified fall, initial encounter: Secondary | ICD-10-CM | POA: Diagnosis not present

## 2024-02-21 DIAGNOSIS — R6889 Other general symptoms and signs: Secondary | ICD-10-CM | POA: Diagnosis not present

## 2024-02-21 DIAGNOSIS — R404 Transient alteration of awareness: Secondary | ICD-10-CM | POA: Diagnosis not present

## 2024-02-21 DIAGNOSIS — R410 Disorientation, unspecified: Secondary | ICD-10-CM | POA: Diagnosis not present

## 2024-02-21 DIAGNOSIS — S0990XA Unspecified injury of head, initial encounter: Secondary | ICD-10-CM | POA: Diagnosis not present

## 2024-02-21 DIAGNOSIS — M549 Dorsalgia, unspecified: Secondary | ICD-10-CM | POA: Diagnosis not present

## 2024-02-27 DIAGNOSIS — Z7984 Long term (current) use of oral hypoglycemic drugs: Secondary | ICD-10-CM | POA: Diagnosis not present

## 2024-02-27 DIAGNOSIS — Z043 Encounter for examination and observation following other accident: Secondary | ICD-10-CM | POA: Diagnosis not present

## 2024-02-27 DIAGNOSIS — M542 Cervicalgia: Secondary | ICD-10-CM | POA: Diagnosis not present

## 2024-02-27 DIAGNOSIS — Z9181 History of falling: Secondary | ICD-10-CM | POA: Diagnosis not present

## 2024-02-27 DIAGNOSIS — Z743 Need for continuous supervision: Secondary | ICD-10-CM | POA: Diagnosis not present

## 2024-02-27 DIAGNOSIS — E119 Type 2 diabetes mellitus without complications: Secondary | ICD-10-CM | POA: Diagnosis not present

## 2024-02-27 DIAGNOSIS — I2510 Atherosclerotic heart disease of native coronary artery without angina pectoris: Secondary | ICD-10-CM | POA: Diagnosis not present

## 2024-02-27 DIAGNOSIS — W19XXXA Unspecified fall, initial encounter: Secondary | ICD-10-CM | POA: Diagnosis not present

## 2024-02-27 DIAGNOSIS — S40812A Abrasion of left upper arm, initial encounter: Secondary | ICD-10-CM | POA: Diagnosis not present

## 2024-02-27 DIAGNOSIS — Z79899 Other long term (current) drug therapy: Secondary | ICD-10-CM | POA: Diagnosis not present

## 2024-02-27 DIAGNOSIS — G4489 Other headache syndrome: Secondary | ICD-10-CM | POA: Diagnosis not present

## 2024-02-27 DIAGNOSIS — R410 Disorientation, unspecified: Secondary | ICD-10-CM | POA: Diagnosis not present

## 2024-02-27 DIAGNOSIS — S0033XA Contusion of nose, initial encounter: Secondary | ICD-10-CM | POA: Diagnosis not present

## 2024-02-27 DIAGNOSIS — R6889 Other general symptoms and signs: Secondary | ICD-10-CM | POA: Diagnosis not present

## 2024-02-29 DIAGNOSIS — Z9181 History of falling: Secondary | ICD-10-CM | POA: Diagnosis not present

## 2024-02-29 DIAGNOSIS — R2689 Other abnormalities of gait and mobility: Secondary | ICD-10-CM | POA: Diagnosis not present

## 2024-02-29 DIAGNOSIS — S32592A Other specified fracture of left pubis, initial encounter for closed fracture: Secondary | ICD-10-CM | POA: Diagnosis not present

## 2024-02-29 DIAGNOSIS — M810 Age-related osteoporosis without current pathological fracture: Secondary | ICD-10-CM | POA: Diagnosis not present

## 2024-02-29 DIAGNOSIS — M6281 Muscle weakness (generalized): Secondary | ICD-10-CM | POA: Diagnosis not present

## 2024-02-29 DIAGNOSIS — F03918 Unspecified dementia, unspecified severity, with other behavioral disturbance: Secondary | ICD-10-CM | POA: Diagnosis not present

## 2024-03-01 DIAGNOSIS — N390 Urinary tract infection, site not specified: Secondary | ICD-10-CM | POA: Diagnosis not present

## 2024-03-02 DIAGNOSIS — N183 Chronic kidney disease, stage 3 unspecified: Secondary | ICD-10-CM | POA: Diagnosis not present

## 2024-03-02 DIAGNOSIS — Z9181 History of falling: Secondary | ICD-10-CM | POA: Diagnosis not present

## 2024-03-02 DIAGNOSIS — I129 Hypertensive chronic kidney disease with stage 1 through stage 4 chronic kidney disease, or unspecified chronic kidney disease: Secondary | ICD-10-CM | POA: Diagnosis not present

## 2024-03-02 DIAGNOSIS — F17210 Nicotine dependence, cigarettes, uncomplicated: Secondary | ICD-10-CM | POA: Diagnosis not present

## 2024-03-02 DIAGNOSIS — Z6820 Body mass index (BMI) 20.0-20.9, adult: Secondary | ICD-10-CM | POA: Diagnosis not present

## 2024-03-02 DIAGNOSIS — K429 Umbilical hernia without obstruction or gangrene: Secondary | ICD-10-CM | POA: Diagnosis not present

## 2024-03-02 DIAGNOSIS — M150 Primary generalized (osteo)arthritis: Secondary | ICD-10-CM | POA: Diagnosis not present

## 2024-03-02 DIAGNOSIS — M800AXD Age-related osteoporosis with current pathological fracture, other site, subsequent encounter for fracture with routine healing: Secondary | ICD-10-CM | POA: Diagnosis not present

## 2024-03-02 DIAGNOSIS — Z7984 Long term (current) use of oral hypoglycemic drugs: Secondary | ICD-10-CM | POA: Diagnosis not present

## 2024-03-02 DIAGNOSIS — I2510 Atherosclerotic heart disease of native coronary artery without angina pectoris: Secondary | ICD-10-CM | POA: Diagnosis not present

## 2024-03-02 DIAGNOSIS — E538 Deficiency of other specified B group vitamins: Secondary | ICD-10-CM | POA: Diagnosis not present

## 2024-03-02 DIAGNOSIS — N390 Urinary tract infection, site not specified: Secondary | ICD-10-CM | POA: Diagnosis not present

## 2024-03-02 DIAGNOSIS — G4700 Insomnia, unspecified: Secondary | ICD-10-CM | POA: Diagnosis not present

## 2024-03-02 DIAGNOSIS — Z79899 Other long term (current) drug therapy: Secondary | ICD-10-CM | POA: Diagnosis not present

## 2024-03-02 DIAGNOSIS — E1122 Type 2 diabetes mellitus with diabetic chronic kidney disease: Secondary | ICD-10-CM | POA: Diagnosis not present

## 2024-03-02 DIAGNOSIS — G894 Chronic pain syndrome: Secondary | ICD-10-CM | POA: Diagnosis not present

## 2024-03-02 DIAGNOSIS — E1141 Type 2 diabetes mellitus with diabetic mononeuropathy: Secondary | ICD-10-CM | POA: Diagnosis not present

## 2024-03-02 DIAGNOSIS — E7800 Pure hypercholesterolemia, unspecified: Secondary | ICD-10-CM | POA: Diagnosis not present

## 2024-03-02 DIAGNOSIS — J449 Chronic obstructive pulmonary disease, unspecified: Secondary | ICD-10-CM | POA: Diagnosis not present

## 2024-03-04 DIAGNOSIS — M6281 Muscle weakness (generalized): Secondary | ICD-10-CM | POA: Diagnosis not present

## 2024-03-04 DIAGNOSIS — S50811A Abrasion of right forearm, initial encounter: Secondary | ICD-10-CM | POA: Diagnosis not present

## 2024-03-04 DIAGNOSIS — R2689 Other abnormalities of gait and mobility: Secondary | ICD-10-CM | POA: Diagnosis not present

## 2024-03-04 DIAGNOSIS — F03918 Unspecified dementia, unspecified severity, with other behavioral disturbance: Secondary | ICD-10-CM | POA: Diagnosis not present

## 2024-03-04 DIAGNOSIS — Z9181 History of falling: Secondary | ICD-10-CM | POA: Diagnosis not present

## 2024-03-08 DIAGNOSIS — E538 Deficiency of other specified B group vitamins: Secondary | ICD-10-CM | POA: Diagnosis not present

## 2024-03-08 DIAGNOSIS — M150 Primary generalized (osteo)arthritis: Secondary | ICD-10-CM | POA: Diagnosis not present

## 2024-03-08 DIAGNOSIS — M800AXD Age-related osteoporosis with current pathological fracture, other site, subsequent encounter for fracture with routine healing: Secondary | ICD-10-CM | POA: Diagnosis not present

## 2024-03-08 DIAGNOSIS — E1141 Type 2 diabetes mellitus with diabetic mononeuropathy: Secondary | ICD-10-CM | POA: Diagnosis not present

## 2024-03-08 DIAGNOSIS — F17210 Nicotine dependence, cigarettes, uncomplicated: Secondary | ICD-10-CM | POA: Diagnosis not present

## 2024-03-08 DIAGNOSIS — G4700 Insomnia, unspecified: Secondary | ICD-10-CM | POA: Diagnosis not present

## 2024-03-08 DIAGNOSIS — E7800 Pure hypercholesterolemia, unspecified: Secondary | ICD-10-CM | POA: Diagnosis not present

## 2024-03-08 DIAGNOSIS — N390 Urinary tract infection, site not specified: Secondary | ICD-10-CM | POA: Diagnosis not present

## 2024-03-08 DIAGNOSIS — K429 Umbilical hernia without obstruction or gangrene: Secondary | ICD-10-CM | POA: Diagnosis not present

## 2024-03-08 DIAGNOSIS — E1122 Type 2 diabetes mellitus with diabetic chronic kidney disease: Secondary | ICD-10-CM | POA: Diagnosis not present

## 2024-03-08 DIAGNOSIS — Z6820 Body mass index (BMI) 20.0-20.9, adult: Secondary | ICD-10-CM | POA: Diagnosis not present

## 2024-03-08 DIAGNOSIS — J449 Chronic obstructive pulmonary disease, unspecified: Secondary | ICD-10-CM | POA: Diagnosis not present

## 2024-03-08 DIAGNOSIS — N183 Chronic kidney disease, stage 3 unspecified: Secondary | ICD-10-CM | POA: Diagnosis not present

## 2024-03-08 DIAGNOSIS — G894 Chronic pain syndrome: Secondary | ICD-10-CM | POA: Diagnosis not present

## 2024-03-08 DIAGNOSIS — I129 Hypertensive chronic kidney disease with stage 1 through stage 4 chronic kidney disease, or unspecified chronic kidney disease: Secondary | ICD-10-CM | POA: Diagnosis not present

## 2024-03-08 DIAGNOSIS — Z79899 Other long term (current) drug therapy: Secondary | ICD-10-CM | POA: Diagnosis not present

## 2024-03-08 DIAGNOSIS — Z7984 Long term (current) use of oral hypoglycemic drugs: Secondary | ICD-10-CM | POA: Diagnosis not present

## 2024-03-08 DIAGNOSIS — Z9181 History of falling: Secondary | ICD-10-CM | POA: Diagnosis not present

## 2024-03-08 DIAGNOSIS — I2510 Atherosclerotic heart disease of native coronary artery without angina pectoris: Secondary | ICD-10-CM | POA: Diagnosis not present

## 2024-03-18 DIAGNOSIS — M6281 Muscle weakness (generalized): Secondary | ICD-10-CM | POA: Diagnosis not present

## 2024-03-18 DIAGNOSIS — R2689 Other abnormalities of gait and mobility: Secondary | ICD-10-CM | POA: Diagnosis not present

## 2024-03-18 DIAGNOSIS — F0390 Unspecified dementia without behavioral disturbance: Secondary | ICD-10-CM | POA: Diagnosis not present

## 2024-03-18 DIAGNOSIS — G894 Chronic pain syndrome: Secondary | ICD-10-CM | POA: Diagnosis not present

## 2024-03-21 DIAGNOSIS — R2689 Other abnormalities of gait and mobility: Secondary | ICD-10-CM

## 2024-03-21 DIAGNOSIS — Z9181 History of falling: Secondary | ICD-10-CM

## 2024-03-21 DIAGNOSIS — M6281 Muscle weakness (generalized): Secondary | ICD-10-CM

## 2024-03-21 DIAGNOSIS — Z87440 Personal history of urinary (tract) infections: Secondary | ICD-10-CM

## 2024-03-21 DIAGNOSIS — F03918 Unspecified dementia, unspecified severity, with other behavioral disturbance: Secondary | ICD-10-CM

## 2024-03-30 DIAGNOSIS — R2689 Other abnormalities of gait and mobility: Secondary | ICD-10-CM | POA: Diagnosis not present

## 2024-03-30 DIAGNOSIS — Z9181 History of falling: Secondary | ICD-10-CM | POA: Diagnosis not present

## 2024-03-30 DIAGNOSIS — Z87440 Personal history of urinary (tract) infections: Secondary | ICD-10-CM | POA: Diagnosis not present

## 2024-03-30 DIAGNOSIS — F03918 Unspecified dementia, unspecified severity, with other behavioral disturbance: Secondary | ICD-10-CM | POA: Diagnosis not present

## 2024-03-30 DIAGNOSIS — M6281 Muscle weakness (generalized): Secondary | ICD-10-CM | POA: Diagnosis not present

## 2024-04-04 DIAGNOSIS — M6281 Muscle weakness (generalized): Secondary | ICD-10-CM | POA: Diagnosis not present

## 2024-04-04 DIAGNOSIS — F03918 Unspecified dementia, unspecified severity, with other behavioral disturbance: Secondary | ICD-10-CM | POA: Diagnosis not present

## 2024-04-04 DIAGNOSIS — Z9181 History of falling: Secondary | ICD-10-CM | POA: Diagnosis not present

## 2024-04-04 DIAGNOSIS — R2689 Other abnormalities of gait and mobility: Secondary | ICD-10-CM | POA: Diagnosis not present

## 2024-04-05 DIAGNOSIS — N390 Urinary tract infection, site not specified: Secondary | ICD-10-CM | POA: Diagnosis not present

## 2024-04-06 DIAGNOSIS — M6281 Muscle weakness (generalized): Secondary | ICD-10-CM | POA: Diagnosis not present

## 2024-04-06 DIAGNOSIS — H1131 Conjunctival hemorrhage, right eye: Secondary | ICD-10-CM | POA: Diagnosis not present

## 2024-04-06 DIAGNOSIS — F03918 Unspecified dementia, unspecified severity, with other behavioral disturbance: Secondary | ICD-10-CM | POA: Diagnosis not present

## 2024-04-11 DIAGNOSIS — E538 Deficiency of other specified B group vitamins: Secondary | ICD-10-CM | POA: Diagnosis not present

## 2024-04-11 DIAGNOSIS — E785 Hyperlipidemia, unspecified: Secondary | ICD-10-CM | POA: Diagnosis not present

## 2024-04-11 DIAGNOSIS — N184 Chronic kidney disease, stage 4 (severe): Secondary | ICD-10-CM | POA: Diagnosis not present

## 2024-04-11 DIAGNOSIS — G894 Chronic pain syndrome: Secondary | ICD-10-CM | POA: Diagnosis not present

## 2024-04-11 DIAGNOSIS — M1990 Unspecified osteoarthritis, unspecified site: Secondary | ICD-10-CM | POA: Diagnosis not present

## 2024-04-11 DIAGNOSIS — E1122 Type 2 diabetes mellitus with diabetic chronic kidney disease: Secondary | ICD-10-CM | POA: Diagnosis not present

## 2024-04-11 DIAGNOSIS — I129 Hypertensive chronic kidney disease with stage 1 through stage 4 chronic kidney disease, or unspecified chronic kidney disease: Secondary | ICD-10-CM | POA: Diagnosis not present

## 2024-04-11 DIAGNOSIS — M6281 Muscle weakness (generalized): Secondary | ICD-10-CM | POA: Diagnosis not present

## 2024-04-11 DIAGNOSIS — F03918 Unspecified dementia, unspecified severity, with other behavioral disturbance: Secondary | ICD-10-CM

## 2024-04-11 DIAGNOSIS — E559 Vitamin D deficiency, unspecified: Secondary | ICD-10-CM | POA: Diagnosis not present

## 2024-04-11 DIAGNOSIS — J449 Chronic obstructive pulmonary disease, unspecified: Secondary | ICD-10-CM | POA: Diagnosis not present

## 2024-04-12 DIAGNOSIS — N183 Chronic kidney disease, stage 3 unspecified: Secondary | ICD-10-CM | POA: Diagnosis not present

## 2024-04-12 DIAGNOSIS — D649 Anemia, unspecified: Secondary | ICD-10-CM | POA: Diagnosis not present

## 2024-04-12 DIAGNOSIS — I129 Hypertensive chronic kidney disease with stage 1 through stage 4 chronic kidney disease, or unspecified chronic kidney disease: Secondary | ICD-10-CM | POA: Diagnosis not present

## 2024-04-12 DIAGNOSIS — E1122 Type 2 diabetes mellitus with diabetic chronic kidney disease: Secondary | ICD-10-CM | POA: Diagnosis not present

## 2024-04-13 DIAGNOSIS — E1122 Type 2 diabetes mellitus with diabetic chronic kidney disease: Secondary | ICD-10-CM | POA: Diagnosis not present

## 2024-04-13 DIAGNOSIS — E871 Hypo-osmolality and hyponatremia: Secondary | ICD-10-CM | POA: Diagnosis not present

## 2024-04-13 DIAGNOSIS — F0390 Unspecified dementia without behavioral disturbance: Secondary | ICD-10-CM | POA: Diagnosis not present

## 2024-04-13 DIAGNOSIS — N184 Chronic kidney disease, stage 4 (severe): Secondary | ICD-10-CM | POA: Diagnosis not present

## 2024-04-20 DIAGNOSIS — F03918 Unspecified dementia, unspecified severity, with other behavioral disturbance: Secondary | ICD-10-CM | POA: Diagnosis not present

## 2024-04-20 DIAGNOSIS — R5381 Other malaise: Secondary | ICD-10-CM | POA: Diagnosis not present

## 2024-04-20 DIAGNOSIS — M6281 Muscle weakness (generalized): Secondary | ICD-10-CM | POA: Diagnosis not present

## 2024-04-27 DIAGNOSIS — Z9181 History of falling: Secondary | ICD-10-CM | POA: Diagnosis not present

## 2024-04-27 DIAGNOSIS — R296 Repeated falls: Secondary | ICD-10-CM | POA: Diagnosis not present

## 2024-04-27 DIAGNOSIS — M6281 Muscle weakness (generalized): Secondary | ICD-10-CM | POA: Diagnosis not present

## 2024-04-27 DIAGNOSIS — F03918 Unspecified dementia, unspecified severity, with other behavioral disturbance: Secondary | ICD-10-CM | POA: Diagnosis not present

## 2024-04-27 DIAGNOSIS — R2689 Other abnormalities of gait and mobility: Secondary | ICD-10-CM | POA: Diagnosis not present

## 2024-05-04 DIAGNOSIS — S5011XA Contusion of right forearm, initial encounter: Secondary | ICD-10-CM | POA: Diagnosis not present

## 2024-05-04 DIAGNOSIS — F03918 Unspecified dementia, unspecified severity, with other behavioral disturbance: Secondary | ICD-10-CM | POA: Diagnosis not present

## 2024-05-04 DIAGNOSIS — Z9181 History of falling: Secondary | ICD-10-CM | POA: Diagnosis not present

## 2024-05-04 DIAGNOSIS — M6281 Muscle weakness (generalized): Secondary | ICD-10-CM | POA: Diagnosis not present

## 2024-05-04 DIAGNOSIS — R2689 Other abnormalities of gait and mobility: Secondary | ICD-10-CM | POA: Diagnosis not present

## 2024-05-19 DIAGNOSIS — W19XXXA Unspecified fall, initial encounter: Secondary | ICD-10-CM | POA: Diagnosis not present

## 2024-05-19 DIAGNOSIS — R531 Weakness: Secondary | ICD-10-CM | POA: Diagnosis not present

## 2024-05-19 DIAGNOSIS — Z743 Need for continuous supervision: Secondary | ICD-10-CM | POA: Diagnosis not present

## 2024-05-19 DIAGNOSIS — R6889 Other general symptoms and signs: Secondary | ICD-10-CM | POA: Diagnosis not present

## 2024-05-19 DIAGNOSIS — S32512A Fracture of superior rim of left pubis, initial encounter for closed fracture: Secondary | ICD-10-CM | POA: Diagnosis not present

## 2024-05-19 DIAGNOSIS — S32592A Other specified fracture of left pubis, initial encounter for closed fracture: Secondary | ICD-10-CM | POA: Diagnosis not present

## 2024-05-19 DIAGNOSIS — R404 Transient alteration of awareness: Secondary | ICD-10-CM | POA: Diagnosis not present

## 2024-05-19 DIAGNOSIS — S7002XA Contusion of left hip, initial encounter: Secondary | ICD-10-CM | POA: Diagnosis not present

## 2024-05-19 DIAGNOSIS — R0902 Hypoxemia: Secondary | ICD-10-CM | POA: Diagnosis not present

## 2024-05-20 DIAGNOSIS — Z9181 History of falling: Secondary | ICD-10-CM | POA: Diagnosis not present

## 2024-05-20 DIAGNOSIS — M545 Low back pain, unspecified: Secondary | ICD-10-CM | POA: Diagnosis not present

## 2024-05-20 DIAGNOSIS — M6281 Muscle weakness (generalized): Secondary | ICD-10-CM | POA: Diagnosis not present

## 2024-05-20 DIAGNOSIS — F03918 Unspecified dementia, unspecified severity, with other behavioral disturbance: Secondary | ICD-10-CM

## 2024-05-20 DIAGNOSIS — G894 Chronic pain syndrome: Secondary | ICD-10-CM | POA: Diagnosis not present

## 2024-05-20 DIAGNOSIS — R2689 Other abnormalities of gait and mobility: Secondary | ICD-10-CM | POA: Diagnosis not present

## 2024-05-23 DIAGNOSIS — B9689 Other specified bacterial agents as the cause of diseases classified elsewhere: Secondary | ICD-10-CM | POA: Diagnosis not present

## 2024-05-23 DIAGNOSIS — M6281 Muscle weakness (generalized): Secondary | ICD-10-CM | POA: Diagnosis not present

## 2024-05-23 DIAGNOSIS — N390 Urinary tract infection, site not specified: Secondary | ICD-10-CM | POA: Diagnosis not present

## 2024-05-23 DIAGNOSIS — R2689 Other abnormalities of gait and mobility: Secondary | ICD-10-CM | POA: Diagnosis not present

## 2024-05-23 DIAGNOSIS — R0789 Other chest pain: Secondary | ICD-10-CM | POA: Diagnosis not present

## 2024-05-23 DIAGNOSIS — F0390 Unspecified dementia without behavioral disturbance: Secondary | ICD-10-CM

## 2024-05-24 DIAGNOSIS — S2242XD Multiple fractures of ribs, left side, subsequent encounter for fracture with routine healing: Secondary | ICD-10-CM | POA: Diagnosis not present

## 2024-05-24 DIAGNOSIS — W19XXXA Unspecified fall, initial encounter: Secondary | ICD-10-CM | POA: Diagnosis not present

## 2024-05-24 DIAGNOSIS — R0781 Pleurodynia: Secondary | ICD-10-CM | POA: Diagnosis not present

## 2024-05-25 DIAGNOSIS — R52 Pain, unspecified: Secondary | ICD-10-CM | POA: Diagnosis not present

## 2024-05-25 DIAGNOSIS — J189 Pneumonia, unspecified organism: Secondary | ICD-10-CM | POA: Diagnosis not present

## 2024-05-25 DIAGNOSIS — R5381 Other malaise: Secondary | ICD-10-CM | POA: Diagnosis not present

## 2024-05-25 DIAGNOSIS — Z9181 History of falling: Secondary | ICD-10-CM | POA: Diagnosis not present

## 2024-05-25 DIAGNOSIS — F0390 Unspecified dementia without behavioral disturbance: Secondary | ICD-10-CM | POA: Diagnosis not present

## 2024-05-26 DIAGNOSIS — F03918 Unspecified dementia, unspecified severity, with other behavioral disturbance: Secondary | ICD-10-CM | POA: Diagnosis not present

## 2024-05-26 DIAGNOSIS — M6281 Muscle weakness (generalized): Secondary | ICD-10-CM | POA: Diagnosis not present

## 2024-05-26 DIAGNOSIS — W01198A Fall on same level from slipping, tripping and stumbling with subsequent striking against other object, initial encounter: Secondary | ICD-10-CM | POA: Diagnosis not present

## 2024-05-26 DIAGNOSIS — M549 Dorsalgia, unspecified: Secondary | ICD-10-CM | POA: Diagnosis not present

## 2024-05-26 DIAGNOSIS — S40012A Contusion of left shoulder, initial encounter: Secondary | ICD-10-CM | POA: Diagnosis not present

## 2024-05-26 DIAGNOSIS — R410 Disorientation, unspecified: Secondary | ICD-10-CM | POA: Diagnosis not present

## 2024-05-26 DIAGNOSIS — S2232XA Fracture of one rib, left side, initial encounter for closed fracture: Secondary | ICD-10-CM | POA: Diagnosis not present

## 2024-05-26 DIAGNOSIS — R0902 Hypoxemia: Secondary | ICD-10-CM | POA: Diagnosis not present

## 2024-05-26 DIAGNOSIS — Z9181 History of falling: Secondary | ICD-10-CM | POA: Diagnosis not present

## 2024-05-26 DIAGNOSIS — R2689 Other abnormalities of gait and mobility: Secondary | ICD-10-CM | POA: Diagnosis not present

## 2024-05-26 DIAGNOSIS — M25512 Pain in left shoulder: Secondary | ICD-10-CM | POA: Diagnosis not present

## 2024-05-26 DIAGNOSIS — W19XXXA Unspecified fall, initial encounter: Secondary | ICD-10-CM | POA: Diagnosis not present

## 2024-05-26 DIAGNOSIS — Z743 Need for continuous supervision: Secondary | ICD-10-CM | POA: Diagnosis not present

## 2024-05-26 DIAGNOSIS — S2242XA Multiple fractures of ribs, left side, initial encounter for closed fracture: Secondary | ICD-10-CM | POA: Diagnosis not present

## 2024-05-31 DIAGNOSIS — R918 Other nonspecific abnormal finding of lung field: Secondary | ICD-10-CM | POA: Diagnosis not present

## 2024-06-06 DIAGNOSIS — F0390 Unspecified dementia without behavioral disturbance: Secondary | ICD-10-CM | POA: Diagnosis not present

## 2024-06-06 DIAGNOSIS — J189 Pneumonia, unspecified organism: Secondary | ICD-10-CM | POA: Diagnosis not present

## 2024-06-06 DIAGNOSIS — E1159 Type 2 diabetes mellitus with other circulatory complications: Secondary | ICD-10-CM | POA: Diagnosis not present

## 2024-06-06 DIAGNOSIS — R5381 Other malaise: Secondary | ICD-10-CM | POA: Diagnosis not present

## 2024-06-06 DIAGNOSIS — R296 Repeated falls: Secondary | ICD-10-CM | POA: Diagnosis not present

## 2024-06-06 DIAGNOSIS — B351 Tinea unguium: Secondary | ICD-10-CM | POA: Diagnosis not present

## 2024-06-08 DIAGNOSIS — R2689 Other abnormalities of gait and mobility: Secondary | ICD-10-CM | POA: Diagnosis not present

## 2024-06-08 DIAGNOSIS — M6281 Muscle weakness (generalized): Secondary | ICD-10-CM | POA: Diagnosis not present

## 2024-06-08 DIAGNOSIS — S81012A Laceration without foreign body, left knee, initial encounter: Secondary | ICD-10-CM | POA: Diagnosis not present

## 2024-06-08 DIAGNOSIS — S51011A Laceration without foreign body of right elbow, initial encounter: Secondary | ICD-10-CM | POA: Diagnosis not present

## 2024-06-08 DIAGNOSIS — F03918 Unspecified dementia, unspecified severity, with other behavioral disturbance: Secondary | ICD-10-CM | POA: Diagnosis not present

## 2024-06-15 DIAGNOSIS — Z9181 History of falling: Secondary | ICD-10-CM | POA: Diagnosis not present

## 2024-06-15 DIAGNOSIS — F03918 Unspecified dementia, unspecified severity, with other behavioral disturbance: Secondary | ICD-10-CM | POA: Diagnosis not present

## 2024-06-15 DIAGNOSIS — R2689 Other abnormalities of gait and mobility: Secondary | ICD-10-CM | POA: Diagnosis not present

## 2024-06-15 DIAGNOSIS — M6281 Muscle weakness (generalized): Secondary | ICD-10-CM | POA: Diagnosis not present

## 2024-06-16 DIAGNOSIS — I129 Hypertensive chronic kidney disease with stage 1 through stage 4 chronic kidney disease, or unspecified chronic kidney disease: Secondary | ICD-10-CM | POA: Diagnosis not present

## 2024-06-20 DIAGNOSIS — R296 Repeated falls: Secondary | ICD-10-CM | POA: Diagnosis not present

## 2024-06-20 DIAGNOSIS — N184 Chronic kidney disease, stage 4 (severe): Secondary | ICD-10-CM | POA: Diagnosis not present

## 2024-06-20 DIAGNOSIS — E1122 Type 2 diabetes mellitus with diabetic chronic kidney disease: Secondary | ICD-10-CM | POA: Diagnosis not present

## 2024-06-20 DIAGNOSIS — F0390 Unspecified dementia without behavioral disturbance: Secondary | ICD-10-CM | POA: Diagnosis not present

## 2024-06-20 DIAGNOSIS — M6281 Muscle weakness (generalized): Secondary | ICD-10-CM | POA: Diagnosis not present

## 2024-07-06 DIAGNOSIS — M6281 Muscle weakness (generalized): Secondary | ICD-10-CM | POA: Diagnosis not present

## 2024-07-06 DIAGNOSIS — E1165 Type 2 diabetes mellitus with hyperglycemia: Secondary | ICD-10-CM | POA: Diagnosis not present

## 2024-07-06 DIAGNOSIS — F0390 Unspecified dementia without behavioral disturbance: Secondary | ICD-10-CM | POA: Diagnosis not present

## 2024-07-13 DIAGNOSIS — R1030 Lower abdominal pain, unspecified: Secondary | ICD-10-CM | POA: Diagnosis not present

## 2024-07-13 DIAGNOSIS — K219 Gastro-esophageal reflux disease without esophagitis: Secondary | ICD-10-CM | POA: Diagnosis not present

## 2024-07-13 DIAGNOSIS — F0390 Unspecified dementia without behavioral disturbance: Secondary | ICD-10-CM | POA: Diagnosis not present

## 2024-07-14 DIAGNOSIS — N390 Urinary tract infection, site not specified: Secondary | ICD-10-CM | POA: Diagnosis not present

## 2024-07-20 DIAGNOSIS — S60212A Contusion of left wrist, initial encounter: Secondary | ICD-10-CM | POA: Diagnosis not present

## 2024-07-20 DIAGNOSIS — S60222A Contusion of left hand, initial encounter: Secondary | ICD-10-CM | POA: Diagnosis not present

## 2024-07-20 DIAGNOSIS — M6281 Muscle weakness (generalized): Secondary | ICD-10-CM | POA: Diagnosis not present

## 2024-07-20 DIAGNOSIS — F03918 Unspecified dementia, unspecified severity, with other behavioral disturbance: Secondary | ICD-10-CM | POA: Diagnosis not present

## 2024-07-25 DIAGNOSIS — N390 Urinary tract infection, site not specified: Secondary | ICD-10-CM | POA: Diagnosis not present

## 2024-07-28 DIAGNOSIS — M6281 Muscle weakness (generalized): Secondary | ICD-10-CM

## 2024-07-28 DIAGNOSIS — N390 Urinary tract infection, site not specified: Secondary | ICD-10-CM

## 2024-07-28 DIAGNOSIS — Z87440 Personal history of urinary (tract) infections: Secondary | ICD-10-CM

## 2024-07-28 DIAGNOSIS — F03918 Unspecified dementia, unspecified severity, with other behavioral disturbance: Secondary | ICD-10-CM

## 2024-08-01 DIAGNOSIS — F0390 Unspecified dementia without behavioral disturbance: Secondary | ICD-10-CM | POA: Diagnosis not present

## 2024-08-01 DIAGNOSIS — Z9181 History of falling: Secondary | ICD-10-CM | POA: Diagnosis not present

## 2024-08-01 DIAGNOSIS — R2689 Other abnormalities of gait and mobility: Secondary | ICD-10-CM | POA: Diagnosis not present

## 2024-08-01 DIAGNOSIS — M6281 Muscle weakness (generalized): Secondary | ICD-10-CM | POA: Diagnosis not present

## 2024-08-01 DIAGNOSIS — E119 Type 2 diabetes mellitus without complications: Secondary | ICD-10-CM | POA: Diagnosis not present

## 2024-08-01 DIAGNOSIS — S42292A Other displaced fracture of upper end of left humerus, initial encounter for closed fracture: Secondary | ICD-10-CM | POA: Diagnosis not present

## 2024-08-04 DIAGNOSIS — F03918 Unspecified dementia, unspecified severity, with other behavioral disturbance: Secondary | ICD-10-CM

## 2024-08-04 DIAGNOSIS — S51812A Laceration without foreign body of left forearm, initial encounter: Secondary | ICD-10-CM

## 2024-08-04 DIAGNOSIS — M6281 Muscle weakness (generalized): Secondary | ICD-10-CM

## 2024-08-08 DIAGNOSIS — S42292A Other displaced fracture of upper end of left humerus, initial encounter for closed fracture: Secondary | ICD-10-CM | POA: Diagnosis not present

## 2024-08-11 DIAGNOSIS — R2689 Other abnormalities of gait and mobility: Secondary | ICD-10-CM | POA: Diagnosis not present

## 2024-08-11 DIAGNOSIS — Z9181 History of falling: Secondary | ICD-10-CM | POA: Diagnosis not present

## 2024-08-11 DIAGNOSIS — M6281 Muscle weakness (generalized): Secondary | ICD-10-CM | POA: Diagnosis not present

## 2024-08-11 DIAGNOSIS — E1165 Type 2 diabetes mellitus with hyperglycemia: Secondary | ICD-10-CM | POA: Diagnosis not present

## 2024-08-11 DIAGNOSIS — F03918 Unspecified dementia, unspecified severity, with other behavioral disturbance: Secondary | ICD-10-CM | POA: Diagnosis not present

## 2024-08-17 DIAGNOSIS — Z9181 History of falling: Secondary | ICD-10-CM | POA: Diagnosis not present

## 2024-08-17 DIAGNOSIS — M6281 Muscle weakness (generalized): Secondary | ICD-10-CM | POA: Diagnosis not present

## 2024-08-17 DIAGNOSIS — F03918 Unspecified dementia, unspecified severity, with other behavioral disturbance: Secondary | ICD-10-CM | POA: Diagnosis not present

## 2024-08-24 DIAGNOSIS — N183 Chronic kidney disease, stage 3 unspecified: Secondary | ICD-10-CM | POA: Diagnosis not present

## 2024-08-24 DIAGNOSIS — G894 Chronic pain syndrome: Secondary | ICD-10-CM | POA: Diagnosis not present

## 2024-08-24 DIAGNOSIS — E1122 Type 2 diabetes mellitus with diabetic chronic kidney disease: Secondary | ICD-10-CM | POA: Diagnosis not present

## 2024-08-24 DIAGNOSIS — M6281 Muscle weakness (generalized): Secondary | ICD-10-CM | POA: Diagnosis not present

## 2024-08-24 DIAGNOSIS — F03918 Unspecified dementia, unspecified severity, with other behavioral disturbance: Secondary | ICD-10-CM

## 2024-08-31 DIAGNOSIS — H02002 Unspecified entropion of right lower eyelid: Secondary | ICD-10-CM | POA: Diagnosis not present

## 2024-08-31 DIAGNOSIS — M6281 Muscle weakness (generalized): Secondary | ICD-10-CM | POA: Diagnosis not present

## 2024-08-31 DIAGNOSIS — E119 Type 2 diabetes mellitus without complications: Secondary | ICD-10-CM | POA: Diagnosis not present

## 2024-08-31 DIAGNOSIS — F0390 Unspecified dementia without behavioral disturbance: Secondary | ICD-10-CM | POA: Diagnosis not present

## 2024-08-31 DIAGNOSIS — S42292A Other displaced fracture of upper end of left humerus, initial encounter for closed fracture: Secondary | ICD-10-CM | POA: Diagnosis not present

## 2024-09-01 DIAGNOSIS — N390 Urinary tract infection, site not specified: Secondary | ICD-10-CM | POA: Diagnosis not present

## 2024-09-01 DIAGNOSIS — S0083XA Contusion of other part of head, initial encounter: Secondary | ICD-10-CM | POA: Diagnosis not present

## 2024-09-01 DIAGNOSIS — E119 Type 2 diabetes mellitus without complications: Secondary | ICD-10-CM | POA: Diagnosis not present

## 2024-09-01 DIAGNOSIS — W19XXXA Unspecified fall, initial encounter: Secondary | ICD-10-CM

## 2024-09-01 DIAGNOSIS — M6281 Muscle weakness (generalized): Secondary | ICD-10-CM | POA: Diagnosis not present

## 2024-09-05 DIAGNOSIS — R296 Repeated falls: Secondary | ICD-10-CM | POA: Diagnosis not present

## 2024-09-05 DIAGNOSIS — R262 Difficulty in walking, not elsewhere classified: Secondary | ICD-10-CM | POA: Diagnosis not present

## 2024-09-05 DIAGNOSIS — Z9181 History of falling: Secondary | ICD-10-CM | POA: Diagnosis not present

## 2024-09-05 DIAGNOSIS — F03918 Unspecified dementia, unspecified severity, with other behavioral disturbance: Secondary | ICD-10-CM | POA: Diagnosis not present

## 2024-09-07 DIAGNOSIS — R54 Age-related physical debility: Secondary | ICD-10-CM | POA: Diagnosis not present

## 2024-09-07 DIAGNOSIS — R262 Difficulty in walking, not elsewhere classified: Secondary | ICD-10-CM | POA: Diagnosis not present

## 2024-09-07 DIAGNOSIS — Z9181 History of falling: Secondary | ICD-10-CM | POA: Diagnosis not present

## 2024-09-07 DIAGNOSIS — N390 Urinary tract infection, site not specified: Secondary | ICD-10-CM | POA: Diagnosis not present

## 2024-09-14 DIAGNOSIS — F0390 Unspecified dementia without behavioral disturbance: Secondary | ICD-10-CM | POA: Diagnosis not present

## 2024-09-14 DIAGNOSIS — M6281 Muscle weakness (generalized): Secondary | ICD-10-CM | POA: Diagnosis not present

## 2024-09-14 DIAGNOSIS — R269 Unspecified abnormalities of gait and mobility: Secondary | ICD-10-CM | POA: Diagnosis not present

## 2024-09-14 DIAGNOSIS — Z9181 History of falling: Secondary | ICD-10-CM | POA: Diagnosis not present

## 2024-09-14 DIAGNOSIS — R262 Difficulty in walking, not elsewhere classified: Secondary | ICD-10-CM | POA: Diagnosis not present

## 2024-09-14 DIAGNOSIS — R2689 Other abnormalities of gait and mobility: Secondary | ICD-10-CM | POA: Diagnosis not present

## 2024-09-14 DIAGNOSIS — E1159 Type 2 diabetes mellitus with other circulatory complications: Secondary | ICD-10-CM | POA: Diagnosis not present

## 2024-09-15 DIAGNOSIS — M810 Age-related osteoporosis without current pathological fracture: Secondary | ICD-10-CM | POA: Diagnosis not present

## 2024-09-15 DIAGNOSIS — N184 Chronic kidney disease, stage 4 (severe): Secondary | ICD-10-CM

## 2024-09-15 DIAGNOSIS — J449 Chronic obstructive pulmonary disease, unspecified: Secondary | ICD-10-CM

## 2024-09-15 DIAGNOSIS — I129 Hypertensive chronic kidney disease with stage 1 through stage 4 chronic kidney disease, or unspecified chronic kidney disease: Secondary | ICD-10-CM

## 2024-09-15 DIAGNOSIS — E1122 Type 2 diabetes mellitus with diabetic chronic kidney disease: Secondary | ICD-10-CM

## 2024-09-15 DIAGNOSIS — E559 Vitamin D deficiency, unspecified: Secondary | ICD-10-CM

## 2024-09-15 DIAGNOSIS — G894 Chronic pain syndrome: Secondary | ICD-10-CM | POA: Diagnosis not present

## 2024-09-15 DIAGNOSIS — E785 Hyperlipidemia, unspecified: Secondary | ICD-10-CM | POA: Diagnosis not present

## 2024-09-15 DIAGNOSIS — E538 Deficiency of other specified B group vitamins: Secondary | ICD-10-CM

## 2024-09-15 DIAGNOSIS — F03918 Unspecified dementia, unspecified severity, with other behavioral disturbance: Secondary | ICD-10-CM

## 2024-09-15 DIAGNOSIS — M6281 Muscle weakness (generalized): Secondary | ICD-10-CM | POA: Diagnosis not present

## 2024-09-16 DIAGNOSIS — D631 Anemia in chronic kidney disease: Secondary | ICD-10-CM | POA: Diagnosis not present

## 2024-09-16 DIAGNOSIS — N184 Chronic kidney disease, stage 4 (severe): Secondary | ICD-10-CM | POA: Diagnosis not present

## 2024-09-16 DIAGNOSIS — F03918 Unspecified dementia, unspecified severity, with other behavioral disturbance: Secondary | ICD-10-CM | POA: Diagnosis not present

## 2024-09-16 DIAGNOSIS — E785 Hyperlipidemia, unspecified: Secondary | ICD-10-CM | POA: Diagnosis not present

## 2024-09-16 DIAGNOSIS — E1122 Type 2 diabetes mellitus with diabetic chronic kidney disease: Secondary | ICD-10-CM | POA: Diagnosis not present

## 2024-09-28 DIAGNOSIS — N184 Chronic kidney disease, stage 4 (severe): Secondary | ICD-10-CM | POA: Diagnosis not present

## 2024-09-28 DIAGNOSIS — R634 Abnormal weight loss: Secondary | ICD-10-CM | POA: Diagnosis not present

## 2024-09-28 DIAGNOSIS — F03918 Unspecified dementia, unspecified severity, with other behavioral disturbance: Secondary | ICD-10-CM | POA: Diagnosis not present

## 2024-09-28 DIAGNOSIS — E1122 Type 2 diabetes mellitus with diabetic chronic kidney disease: Secondary | ICD-10-CM | POA: Diagnosis not present

## 2024-10-24 DIAGNOSIS — K219 Gastro-esophageal reflux disease without esophagitis: Secondary | ICD-10-CM | POA: Diagnosis not present

## 2024-10-24 DIAGNOSIS — R2689 Other abnormalities of gait and mobility: Secondary | ICD-10-CM | POA: Diagnosis not present

## 2024-10-24 DIAGNOSIS — J309 Allergic rhinitis, unspecified: Secondary | ICD-10-CM | POA: Diagnosis not present
